# Patient Record
Sex: Female | Born: 1960
Health system: Southern US, Community
[De-identification: ages and names within clinical notes are randomized; demographics above are authoritative.]

## PROBLEM LIST (undated history)

## (undated) DIAGNOSIS — I10 Essential (primary) hypertension: Secondary | ICD-10-CM

## (undated) DIAGNOSIS — D649 Anemia, unspecified: Secondary | ICD-10-CM

## (undated) DIAGNOSIS — M419 Scoliosis, unspecified: Secondary | ICD-10-CM

## (undated) DIAGNOSIS — Z8669 Personal history of other diseases of the nervous system and sense organs: Secondary | ICD-10-CM

## (undated) DIAGNOSIS — M47812 Spondylosis without myelopathy or radiculopathy, cervical region: Secondary | ICD-10-CM

## (undated) DIAGNOSIS — M1990 Unspecified osteoarthritis, unspecified site: Secondary | ICD-10-CM

## (undated) DIAGNOSIS — M533 Sacrococcygeal disorders, not elsewhere classified: Secondary | ICD-10-CM

## (undated) DIAGNOSIS — C44101 Unspecified malignant neoplasm of skin of unspecified eyelid, including canthus: Secondary | ICD-10-CM

## (undated) DIAGNOSIS — E785 Hyperlipidemia, unspecified: Secondary | ICD-10-CM

## (undated) HISTORY — DX: Spondylosis without myelopathy or radiculopathy, cervical region: M47.812

## (undated) HISTORY — DX: Unspecified osteoarthritis, unspecified site: M19.90

## (undated) HISTORY — DX: Personal history of other diseases of the nervous system and sense organs: Z86.69

## (undated) HISTORY — DX: Hyperlipidemia, unspecified: E78.5

## (undated) HISTORY — DX: Scoliosis, unspecified: M41.9

## (undated) HISTORY — DX: Essential (primary) hypertension: I10

## (undated) HISTORY — PX: HAND SURGERY: SHX662

## (undated) HISTORY — DX: Unspecified malignant neoplasm of skin of unspecified eyelid, including canthus: C44.101

## (undated) HISTORY — PX: MOHS SURGERY: SUR867

## (undated) HISTORY — DX: Sacrococcygeal disorders, not elsewhere classified: M53.3

## (undated) HISTORY — DX: Anemia, unspecified: D64.9

---
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
## 1984-07-07 HISTORY — PX: BREAST SURGERY: SHX581

## 2007-10-20 ENCOUNTER — Ambulatory Visit: Payer: Self-pay | Admitting: Pain Medicine

## 2007-10-21 ENCOUNTER — Ambulatory Visit: Payer: Self-pay | Admitting: Pain Medicine

## 2008-07-07 DIAGNOSIS — M533 Sacrococcygeal disorders, not elsewhere classified: Secondary | ICD-10-CM

## 2008-07-07 DIAGNOSIS — M47812 Spondylosis without myelopathy or radiculopathy, cervical region: Secondary | ICD-10-CM

## 2008-07-07 HISTORY — DX: Spondylosis without myelopathy or radiculopathy, cervical region: M47.812

## 2008-07-07 HISTORY — PX: OTHER SURGICAL HISTORY: SHX169

## 2008-07-07 HISTORY — DX: Sacrococcygeal disorders, not elsewhere classified: M53.3

## 2008-07-11 ENCOUNTER — Ambulatory Visit: Payer: Self-pay | Admitting: Pain Medicine

## 2010-06-27 ENCOUNTER — Ambulatory Visit: Payer: Self-pay | Admitting: Pain Medicine

## 2015-12-24 DIAGNOSIS — I10 Essential (primary) hypertension: Secondary | ICD-10-CM | POA: Diagnosis not present

## 2016-04-14 ENCOUNTER — Encounter: Payer: Self-pay | Admitting: Family Medicine

## 2016-04-14 ENCOUNTER — Ambulatory Visit (INDEPENDENT_AMBULATORY_CARE_PROVIDER_SITE_OTHER): Payer: BLUE CROSS/BLUE SHIELD | Admitting: Family Medicine

## 2016-04-14 VITALS — BP 132/91 | HR 63 | Temp 97.9°F | Resp 20 | Ht 60.0 in | Wt 145.2 lb

## 2016-04-14 DIAGNOSIS — E785 Hyperlipidemia, unspecified: Secondary | ICD-10-CM | POA: Insufficient documentation

## 2016-04-14 DIAGNOSIS — Z7689 Persons encountering health services in other specified circumstances: Secondary | ICD-10-CM | POA: Diagnosis not present

## 2016-04-14 DIAGNOSIS — Z23 Encounter for immunization: Secondary | ICD-10-CM | POA: Diagnosis not present

## 2016-04-14 DIAGNOSIS — I10 Essential (primary) hypertension: Secondary | ICD-10-CM | POA: Insufficient documentation

## 2016-04-14 DIAGNOSIS — M1990 Unspecified osteoarthritis, unspecified site: Secondary | ICD-10-CM | POA: Diagnosis not present

## 2016-04-14 DIAGNOSIS — M47812 Spondylosis without myelopathy or radiculopathy, cervical region: Secondary | ICD-10-CM

## 2016-04-14 MED ORDER — MELOXICAM 7.5 MG PO TABS: 7.5000 mg | ORAL_TABLET | Freq: Every day | ORAL | 3 refills | Status: DC

## 2016-04-14 MED ORDER — LOSARTAN POTASSIUM 50 MG PO TABS: 50.0000 mg | ORAL_TABLET | Freq: Every day | ORAL | 1 refills | Status: DC

## 2016-04-14 MED ORDER — SIMVASTATIN 10 MG PO TABS: 10.0000 mg | ORAL_TABLET | Freq: Every day | ORAL | 3 refills | Status: DC

## 2016-04-14 NOTE — Progress Notes (Signed)
Patient ID: Sarah Fisher, female  DOB: 10-20-1960, 55 y.o.   MRN: [id number] Patient Care Team    Relationship Specialty Notifications Start End  Sarah Hillock, DO PCP - General Family Medicine  04/14/16     Subjective:  Sarah Fisher is a 55 y.o.  female present for new patient establishment. All past medical history, surgical history, allergies, family history, immunizations, medications and social history were obtained and entered  in the electronic medical record today. All recent labs, ED visits and hospitalizations within the last year were reviewed.  Hypertension/hyperlipidemia: She is prescribed losartan 50 mg, and has been on this medicine for about 20 hours. She is prescribed zocor 10 mg Qd and tolerates medications. Pt denies chest pain, shortness of breath or LE edema.   Arthritis/neck pain: patient states she has OA and has had a work up for her arthritis by her brother, which is a pain doctor. She is prescribed mobic and  Zanaflex. She states she only uses these if her pain gets bad, otherwise she uses advil when needed. She has records with her today of labs surrounding her condition: RF negative, ESR, CRP, ANA, Uric Acid, bone density and anti-citrulline all normal. Cervical spine xray 2010: reversal lordosis, degenerative C5-6 levels.   Health maintenance:  Colonoscopy: Never, fhx present.  Mammogram: completed:01/02/2015, birads 2 Completed in [place], scanned in system Cervical cancer screening: last pap: 2016, results: normal per pt. [place] 2016 with submucous and intramural myomas 12.2 and 9.2 mm. Immunizations: tdap 12/2014, Influenza administered today (encouraged yearly) Infectious disease screening: HIV and HEP c screening unknown.  DEXA: never Last eye exam: 2013 Last foot exam: 2015  PRIOR REPORT IMPORTED FROM THE [employer] WORKFLOW SYSTEM   REASON FOR EXAM:    neck pain  COMMENTS:   PROCEDURE:     MR  - MR CERVICAL SPINE WO CONT  - Oct 21 2007   8:32AM   RESULT:     Comparison: No available comparison exam.   Procedure: Multiplanar, multisequence MR examination of the cervical spine  was performed without gadolinium contrast.   Findings:   There is straightening the cervical spine without malalignment. The  cervical  cord and cervicomedullary junction are unremarkable. There is no  significant  bone marrow signal abnormality to suggest an acute fracture or a  suspicious  osseous lesion.  C5-C6: There is disc desiccation with mild disc height loss. There is mild  right paracentral disc osteophyte complex. There is mild canal narrowing.  There is no significant neural foraminal narrowing.  There is mild multilevel disc desiccation. No significant disc herniation,  canal or neural foraminal narrowing is seen at the other cervical levels.  The left vertebral artery is dominant.   IMPRESSION:  1. There is mild right paracentral C5-C6 disc osteophyte complex with mild  canal narrowing.   Immunization History  Administered Date(s) Administered  . Influenza,inj,Quad PF,36+ Mos 04/14/2016  . Tdap 12/06/2014    Past Medical History:  Diagnosis Date  . Anemia   . Arthritis   . History of carpal tunnel syndrome   . Hyperlipidemia   . Hypertension   . Scoliosis    No Known Allergies Past Surgical History:  Procedure Laterality Date  . BREAST SURGERY  1986   reduction  . HAND SURGERY Bilateral 2000 and 2016   benign tumor removal    Family History  Problem Relation Age of Onset  . Hypertension Mother   . Arthritis  Mother   . Hypertension Father   . Liver cancer Father   . Hypertension Sister   . Arthritis Maternal Aunt   . Hypertension Maternal Grandmother   . Alzheimer's disease Maternal Grandfather   . Colon cancer Maternal Grandfather   . Diabetes Maternal Grandfather   . Alzheimer's disease Paternal Grandmother    Social History   Social History  . Marital status: Divorced    Spouse name: N/A    . Number of children: 1  . Years of education: N/A   Occupational History  . Not on file.   Social History Main Topics  . Smoking status: Never Smoker  . Smokeless tobacco: Never Used  . Alcohol use No  . Drug use: No  . Sexual activity: Yes    Partners: Male    Birth control/ protection: None   Other Topics Concern  . Not on file   Social History Narrative   Divorced. 3 children, one lives with [name]).   Drinks caffeine, uses herbal remedies. Takes a daily vitamin.    Wears her seatbelt, bicycle helmet.Smoke detector in the home.    Exercises routinely.    Feels safe in her relationships.         Medication List       Accurate as of 04/14/16 12:17 PM. Always use your most recent med list.          ibuprofen 200 MG tablet Commonly known as:  ADVIL,MOTRIN Take 200 mg by mouth every 6 (six) hours as needed.   losartan 50 MG tablet Commonly known as:  COZAAR Take 1 tablet (50 mg total) by mouth daily.   meloxicam 7.5 MG tablet Commonly known as:  MOBIC Take 1 tablet (7.5 mg total) by mouth daily.   simvastatin 10 MG tablet Commonly known as:  ZOCOR Take 1 tablet (10 mg total) by mouth daily.   tiZANidine 2 MG tablet Commonly known as:  ZANAFLEX Take by mouth at bedtime.        No results found for this or any previous visit (from the past 2160 hour(s)).  No results found.   ROS: 14 pt review of systems performed and negative (unless mentioned in an HPI)  Objective: BP (!) 132/91 (BP Location: Right Arm, Patient Position: Sitting, Cuff Size: Normal)   Pulse 63   Temp 97.9 F (36.6 C)   Resp 20   Ht 5' (1.524 m)   Wt 145 lb 4 oz (65.9 kg)   SpO2 99%   BMI 28.37 kg/m  Gen: Afebrile. No acute distress. Nontoxic in appearance, well-developed, well-nourished,  Pleasant  female.  HENT: AT. [place].  MMM Eyes:Pupils Equal Round Reactive to light, Extraocular movements intact,  Conjunctiva without redness, discharge or icterus. Neck/lymp/endocrine:  Supple,no lymphadenopathy CV: RRR no murmur, no edema, +2/4 P posterior tibialis pulses.  Chest: CTAB, no wheeze, rhonchi or crackles.  Abd: Soft. round. NTND. BS present.  Skin:Warm and well-perfused. Skin intact. Neuro/Msk:  Normal gait. PERLA. EOMi. Alert. Oriented x3.   Psych: Normal affect, dress and demeanor. Normal speech. Normal thought content and judgment.  Assessment/plan: Sarah Fisher is a 55 y.o. female present for establishment of care. Need for prophylactic vaccination and inoculation against influenza - Flu Vaccine QUAD 36+ mos PF IM (Fluarix & Fluzone Quad PF)  Essential hypertension/hyperlipidemia - continue zocor and losartan. Refills provided today.  - blood work to be completed on CPE.   Osteoarthritis of cervical spine, unspecified spinal osteoarthritis complication status/Arthritis pain:  -  Take mobic daily with food. Refills provided today. She was not taking medication appropriately to help control arthritis pain.  - if not improved could get new films. Last work up 2010.    Return in about 4 weeks (around 05/12/2016) for CPE./PAP with fasting labs.  Electronically signed by: Howard Pouch, DO [place]

## 2016-04-14 NOTE — Patient Instructions (Signed)
It was a pleasure meeting you today.  Take the daily mobic with food. If this does not help with your arthritis pain in about 2-4 weeks, then we will move forward with other treatments/studies.  Please schedule a CPE with PAP within 4 weeks. Come in fasting (no eating or drinking, anything but water for 9 hours) and we will do all lab work.  I have refilled your medications today.

## 2016-05-15 ENCOUNTER — Ambulatory Visit (INDEPENDENT_AMBULATORY_CARE_PROVIDER_SITE_OTHER): Payer: BLUE CROSS/BLUE SHIELD | Admitting: Family Medicine

## 2016-05-15 ENCOUNTER — Encounter: Payer: Self-pay | Admitting: Family Medicine

## 2016-05-15 ENCOUNTER — Other Ambulatory Visit (HOSPITAL_COMMUNITY)
Admission: RE | Admit: 2016-05-15 | Discharge: 2016-05-15 | Disposition: A | Discharge status: Home / Self Care | Payer: BLUE CROSS/BLUE SHIELD | Source: Ambulatory Visit | Attending: Family Medicine | Admitting: Family Medicine

## 2016-05-15 VITALS — BP 123/88 | HR 70 | Temp 97.9°F | Resp 20 | Ht 60.0 in | Wt 148.0 lb

## 2016-05-15 DIAGNOSIS — Z6828 Body mass index (BMI) 28.0-28.9, adult: Secondary | ICD-10-CM

## 2016-05-15 DIAGNOSIS — Z0000 Encounter for general adult medical examination without abnormal findings: Secondary | ICD-10-CM | POA: Diagnosis not present

## 2016-05-15 DIAGNOSIS — Z1231 Encounter for screening mammogram for malignant neoplasm of breast: Secondary | ICD-10-CM

## 2016-05-15 DIAGNOSIS — Z1321 Encounter for screening for nutritional disorder: Secondary | ICD-10-CM

## 2016-05-15 DIAGNOSIS — Z131 Encounter for screening for diabetes mellitus: Secondary | ICD-10-CM | POA: Diagnosis not present

## 2016-05-15 DIAGNOSIS — M4722 Other spondylosis with radiculopathy, cervical region: Secondary | ICD-10-CM

## 2016-05-15 DIAGNOSIS — Z113 Encounter for screening for infections with a predominantly sexual mode of transmission: Secondary | ICD-10-CM | POA: Diagnosis not present

## 2016-05-15 DIAGNOSIS — Z1159 Encounter for screening for other viral diseases: Secondary | ICD-10-CM

## 2016-05-15 DIAGNOSIS — I10 Essential (primary) hypertension: Secondary | ICD-10-CM | POA: Diagnosis not present

## 2016-05-15 DIAGNOSIS — Z1329 Encounter for screening for other suspected endocrine disorder: Secondary | ICD-10-CM | POA: Diagnosis not present

## 2016-05-15 DIAGNOSIS — Z1211 Encounter for screening for malignant neoplasm of colon: Secondary | ICD-10-CM

## 2016-05-15 DIAGNOSIS — E785 Hyperlipidemia, unspecified: Secondary | ICD-10-CM | POA: Diagnosis not present

## 2016-05-15 DIAGNOSIS — E663 Overweight: Secondary | ICD-10-CM | POA: Insufficient documentation

## 2016-05-15 LAB — COMPREHENSIVE METABOLIC PANEL
ALK PHOS: 60 U/L (ref 39–117)
ALT: 16 U/L (ref 0–35)
AST: 17 U/L (ref 0–37)
Albumin: 4.6 g/dL (ref 3.5–5.2)
BILIRUBIN TOTAL: 0.5 mg/dL (ref 0.2–1.2)
BUN: 21 mg/dL (ref 6–23)
CALCIUM: 9.8 mg/dL (ref 8.4–10.5)
CO2: 28 mEq/L (ref 19–32)
CREATININE: 0.73 mg/dL (ref 0.40–1.20)
Chloride: 106 mEq/L (ref 96–112)
GFR: 87.76 mL/min (ref >60.00)
GLUCOSE: 95 mg/dL (ref 70–99)
Potassium: 3.9 mEq/L (ref 3.5–5.1)
Sodium: 141 mEq/L (ref 135–145)
TOTAL PROTEIN: 7.1 g/dL (ref 6.0–8.3)

## 2016-05-15 LAB — CBC WITH DIFFERENTIAL/PLATELET
BASOS ABS: 0 10*3/uL (ref 0.0–0.1)
Basophils Relative: 0.6 % (ref 0.0–3.0)
Eosinophils Absolute: 0.1 10*3/uL (ref 0.0–0.7)
Eosinophils Relative: 2.2 % (ref 0.0–5.0)
HEMATOCRIT: 37.1 % (ref 36.0–46.0)
Hemoglobin: 12.5 g/dL (ref 12.0–15.0)
LYMPHS PCT: 27.6 % (ref 12.0–46.0)
Lymphs Abs: 1.5 10*3/uL (ref 0.7–4.0)
MCHC: 33.6 g/dL (ref 30.0–36.0)
MCV: 87.9 fl (ref 78.0–100.0)
MONOS PCT: 7.4 % (ref 3.0–12.0)
Monocytes Absolute: 0.4 10*3/uL (ref 0.1–1.0)
Neutro Abs: 3.4 10*3/uL (ref 1.4–7.7)
Neutrophils Relative %: 62.2 % (ref 43.0–77.0)
Platelets: 284 10*3/uL (ref 150.0–400.0)
RBC: 4.22 Mil/uL (ref 3.87–5.11)
RDW: 12.4 % (ref 11.5–15.5)
WBC: 5.5 10*3/uL (ref 4.0–10.5)

## 2016-05-15 LAB — LIPID PANEL
CHOL/HDL RATIO: 3
CHOLESTEROL: 224 mg/dL — AB (ref 0–200)
HDL: 77.9 mg/dL (ref >39.00)
LDL CALC: 115 mg/dL — AB (ref 0–99)
NonHDL: 145.62
Triglycerides: 155 mg/dL — ABNORMAL HIGH (ref 0.0–149.0)
VLDL: 31 mg/dL (ref 0.0–40.0)

## 2016-05-15 LAB — HEMOGLOBIN A1C: HEMOGLOBIN A1C: 5.6 % (ref 4.6–6.5)

## 2016-05-15 LAB — TSH: TSH: 2.06 u[IU]/mL (ref 0.35–4.50)

## 2016-05-15 LAB — VITAMIN D 25 HYDROXY (VIT D DEFICIENCY, FRACTURES): VITD: 21.45 ng/mL — ABNORMAL LOW (ref 30.00–100.00)

## 2016-05-15 NOTE — Patient Instructions (Addendum)
I have referred you for pain management, colonoscopy and mammogram. You will hear from those areas to schedule.  - F/U yearly for physical and every 6 months for hypertension/chronic medical condition.    Health Maintenance, Female Adopting a healthy lifestyle and getting preventive care can go a long way to promote health and wellness. Talk with your health care provider about what schedule of regular examinations is right for you. This is a good chance for you to check in with your provider about disease prevention and staying healthy. In between checkups, there are plenty of things you can do on your own. Experts have done a lot of research about which lifestyle changes and preventive measures are most likely to keep you healthy. Ask your health care provider for more information. WEIGHT AND DIET  Eat a healthy diet  Be sure to include plenty of vegetables, fruits, low-fat dairy products, and lean protein.  Do not eat a lot of foods high in solid fats, added sugars, or salt.  Get regular exercise. This is one of the most important things you can do for your health.  Most adults should exercise for at least 150 minutes each week. The exercise should increase your heart rate and make you sweat (moderate-intensity exercise).  Most adults should also do strengthening exercises at least twice a week. This is in addition to the moderate-intensity exercise.  Maintain a healthy weight  Body mass index (BMI) is a measurement that can be used to identify possible weight problems. It estimates body fat based on height and weight. Your health care provider can help determine your BMI and help you achieve or maintain a healthy weight.  For females 43 years of age and older:   A BMI below 18.5 is considered underweight.  A BMI of 18.5 to 24.9 is normal.  A BMI of 25 to 29.9 is considered overweight.  A BMI of 30 and above is considered obese.  Watch levels of cholesterol and blood  lipids  You should start having your blood tested for lipids and cholesterol at 55 years of age, then have this test every 5 years.  You may need to have your cholesterol levels checked more often if:  Your lipid or cholesterol levels are high.  You are older than 55 years of age.  You are at high risk for heart disease.  CANCER SCREENING   Lung Cancer  Lung cancer screening is recommended for adults 54-65 years old who are at high risk for lung cancer because of a history of smoking.  A yearly low-dose CT scan of the lungs is recommended for people who:  Currently smoke.  Have quit within the past 15 years.  Have at least a 30-pack-year history of smoking. A pack year is smoking an average of one pack of cigarettes a day for 1 year.  Yearly screening should continue until it has been 15 years since you quit.  Yearly screening should stop if you develop a health problem that would prevent you from having lung cancer treatment.  Breast Cancer  Practice breast self-awareness. This means understanding how your breasts normally appear and feel.  It also means doing regular breast self-exams. Let your health care provider know about any changes, no matter how small.  If you are in your 20s or 30s, you should have a clinical breast exam (CBE) by a health care provider every 1-3 years as part of a regular health exam.  If you are 40 or older, have  a CBE every year. Also consider having a breast X-ray (mammogram) every year.  If you have a family history of breast cancer, talk to your health care provider about genetic screening.  If you are at high risk for breast cancer, talk to your health care provider about having an MRI and a mammogram every year.  Breast cancer gene (BRCA) assessment is recommended for women who have family members with BRCA-related cancers. BRCA-related cancers include:  Breast.  Ovarian.  Tubal.  Peritoneal cancers.  Results of the assessment  will determine the need for genetic counseling and BRCA1 and BRCA2 testing. Cervical Cancer Your health care provider may recommend that you be screened regularly for cancer of the pelvic organs (ovaries, uterus, and vagina). This screening involves a pelvic examination, including checking for microscopic changes to the surface of your cervix (Pap test). You may be encouraged to have this screening done every 3 years, beginning at age 63.  For women ages 25-65, health care providers may recommend pelvic exams and Pap testing every 3 years, or they may recommend the Pap and pelvic exam, combined with testing for human papilloma virus (HPV), every 5 years. Some types of HPV increase your risk of cervical cancer. Testing for HPV may also be done on women of any age with unclear Pap test results.  Other health care providers may not recommend any screening for nonpregnant women who are considered low risk for pelvic cancer and who do not have symptoms. Ask your health care provider if a screening pelvic exam is right for you.  If you have had past treatment for cervical cancer or a condition that could lead to cancer, you need Pap tests and screening for cancer for at least 20 years after your treatment. If Pap tests have been discontinued, your risk factors (such as having a new sexual partner) need to be reassessed to determine if screening should resume. Some women have medical problems that increase the chance of getting cervical cancer. In these cases, your health care provider may recommend more frequent screening and Pap tests. Colorectal Cancer  This type of cancer can be detected and often prevented.  Routine colorectal cancer screening usually begins at 55 years of age and continues through 55 years of age.  Your health care provider may recommend screening at an earlier age if you have risk factors for colon cancer.  Your health care provider may also recommend using home test kits to check  for hidden blood in the stool.  A small camera at the end of a tube can be used to examine your colon directly (sigmoidoscopy or colonoscopy). This is done to check for the earliest forms of colorectal cancer.  Routine screening usually begins at age 41.  Direct examination of the colon should be repeated every 5-10 years through 55 years of age. However, you may need to be screened more often if early forms of precancerous polyps or small growths are found. Skin Cancer  Check your skin from head to toe regularly.  Tell your health care provider about any new moles or changes in moles, especially if there is a change in a mole's shape or color.  Also tell your health care provider if you have a mole that is larger than the size of a pencil eraser.  Always use sunscreen. Apply sunscreen liberally and repeatedly throughout the day.  Protect yourself by wearing long sleeves, pants, a wide-brimmed hat, and sunglasses whenever you are outside. HEART DISEASE, DIABETES, AND HIGH  BLOOD PRESSURE   High blood pressure causes heart disease and increases the risk of stroke. High blood pressure is more likely to develop in:  People who have blood pressure in the high end of the normal range (130-139/85-89 mm Hg).  People who are overweight or obese.  People who are African American.  If you are 16-53 years of age, have your blood pressure checked every 3-5 years. If you are 57 years of age or older, have your blood pressure checked every year. You should have your blood pressure measured twice--once when you are at a hospital or clinic, and once when you are not at a hospital or clinic. Record the average of the two measurements. To check your blood pressure when you are not at a hospital or clinic, you can use:  An automated blood pressure machine at a pharmacy.  A home blood pressure monitor.  If you are between 54 years and 31 years old, ask your health care provider if you should take  aspirin to prevent strokes.  Have regular diabetes screenings. This involves taking a blood sample to check your fasting blood sugar level.  If you are at a normal weight and have a low risk for diabetes, have this test once every three years after 55 years of age.  If you are overweight and have a high risk for diabetes, consider being tested at a younger age or more often. PREVENTING INFECTION  Hepatitis B  If you have a higher risk for hepatitis B, you should be screened for this virus. You are considered at high risk for hepatitis B if:  You were born in a country where hepatitis B is common. Ask your health care provider which countries are considered high risk.  Your parents were born in a high-risk country, and you have not been immunized against hepatitis B (hepatitis B vaccine).  You have HIV or AIDS.  You use needles to inject street drugs.  You live with someone who has hepatitis B.  You have had sex with someone who has hepatitis B.  You get hemodialysis treatment.  You take certain medicines for conditions, including cancer, organ transplantation, and autoimmune conditions. Hepatitis C  Blood testing is recommended for:  Everyone born from [birth year] through 1965.  Anyone with known risk factors for hepatitis C. Sexually transmitted infections (STIs)  You should be screened for sexually transmitted infections (STIs) including gonorrhea and chlamydia if:  You are sexually active and are younger than 55 years of age.  You are older than 55 years of age and your health care provider tells you that you are at risk for this type of infection.  Your sexual activity has changed since you were last screened and you are at an increased risk for chlamydia or gonorrhea. Ask your health care provider if you are at risk.  If you do not have HIV, but are at risk, it may be recommended that you take a prescription medicine daily to prevent HIV infection. This is called  pre-exposure prophylaxis (PrEP). You are considered at risk if:  You are sexually active and do not regularly use condoms or know the HIV status of your partner(s).  You take drugs by injection.  You are sexually active with a partner who has HIV. Talk with your health care provider about whether you are at high risk of being infected with HIV. If you choose to begin PrEP, you should first be tested for HIV. You should then be tested every  3 months for as long as you are taking PrEP.  PREGNANCY   If you are premenopausal and you may become pregnant, ask your health care provider about preconception counseling.  If you may become pregnant, take 400 to 800 micrograms (mcg) of folic acid every day.  If you want to prevent pregnancy, talk to your health care provider about birth control (contraception). OSTEOPOROSIS AND MENOPAUSE   Osteoporosis is a disease in which the bones lose minerals and strength with aging. This can result in serious bone fractures. Your risk for osteoporosis can be identified using a bone density scan.  If you are 11 years of age or older, or if you are at risk for osteoporosis and fractures, ask your health care provider if you should be screened.  Ask your health care provider whether you should take a calcium or vitamin D supplement to lower your risk for osteoporosis.  Menopause may have certain physical symptoms and risks.  Hormone replacement therapy may reduce some of these symptoms and risks. Talk to your health care provider about whether hormone replacement therapy is right for you.  HOME CARE INSTRUCTIONS   Schedule regular health, dental, and eye exams.  Stay current with your immunizations.   Do not use any tobacco products including cigarettes, chewing tobacco, or electronic cigarettes.  If you are pregnant, do not drink alcohol.  If you are breastfeeding, limit how much and how often you drink alcohol.  Limit alcohol intake to no more than 1  drink per day for nonpregnant women. One drink equals 12 ounces of beer, 5 ounces of wine, or 1 ounces of hard liquor.  Do not use street drugs.  Do not share needles.  Ask your health care provider for help if you need support or information about quitting drugs.  Tell your health care provider if you often feel depressed.  Tell your health care provider if you have ever been abused or do not feel safe at home.   This information is not intended to replace advice given to you by your health care provider. Make sure you discuss any questions you have with your health care provider.   Document Released: 01/06/2011 Document Revised: 07/14/2014 Document Reviewed: 05/25/2013 Elsevier Interactive Patient Education [employer].

## 2016-05-15 NOTE — Progress Notes (Signed)
Patient ID: Sarah Fisher, female  DOB: 1961-02-23, 55 y.o.   MRN: [id number] Patient Care Team    Relationship Specialty Notifications Start End  Ma Hillock, DO PCP - General Family Medicine  04/14/16     Subjective:  Sarah Fisher is a 55 y.o.  Female  present for CPE. All past medical history, surgical history, allergies, family history, immunizations, medications and social history were updated in the electronic medical record today. All recent labs, ED visits and hospitalizations within the last year were reviewed.  Patient would like to be tested for STDs secondary to recent divorce and the circumstances surrounding that difference she is worried she was exposed to STD. She denies vaginal discharge, irritation or abd pain.  She states she frequently suffers with constipation and she has recently noticed she is more thirsty than prior.   Health maintenance:  Colonoscopy: Never, fhx present in MGF. Referred today to GI.  Mammogram: completed:01/02/2015, birads 2 Completed in [place], scanned in system. Order placed.  Cervical cancer screening: last pap: 2016, results: normal per pt. [place] 2016 with submucous and intramural myomas 12.2 and 9.2 mm. Immunizations: tdap 12/2014, Influenza 2017 (encouraged yearly) Infectious disease screening: HIV and HEP c completed today.  DEXA: never Assistive device: None Oxygen use: None Patient has a Dental home. Hospitalizations/ED visits: none  Depression screen [facility] 2/9 05/15/2016 04/14/2016  Decreased Interest 0 0  Down, Depressed, Hopeless 0 0  PHQ - 2 Score 0 0     Immunization History  Administered Date(s) Administered  . Influenza,inj,Quad PF,36+ Mos 04/14/2016  . Tdap 12/06/2014     Past Medical History:  Diagnosis Date  . Anemia   . Arthritis   . Cervical spine degeneration 2010   C5-6  . History of carpal tunnel syndrome   . Hyperlipidemia   . Hypertension   . Sacroiliac joint pain 2010   right  . Scoliosis     No Known Allergies Past Surgical History:  Procedure Laterality Date  . BREAST SURGERY  1986   reduction  . cervical facet block  2010  . HAND SURGERY Bilateral 2000 and 2016   benign tumor removal    Family History  Problem Relation Age of Onset  . Hypertension Mother   . Arthritis Mother   . Hypertension Father   . Liver cancer Father   . Hypertension Sister   . Arthritis Maternal Aunt   . Hypertension Maternal Grandmother   . Kidney cancer Maternal Grandmother   . Alzheimer's disease Maternal Grandfather   . Colon cancer Maternal Grandfather   . Diabetes Maternal Grandfather   . Alzheimer's disease Paternal Grandmother    Social History   Social History  . Marital status: Divorced    Spouse name: N/A  . Number of children: 3  . Years of education: 35   Occupational History  . housewife    Social History Main Topics  . Smoking status: Never Smoker  . Smokeless tobacco: Never Used  . Alcohol use No  . Drug use: No  . Sexual activity: Yes    Partners: Male    Birth control/ protection: None   Other Topics Concern  . Not on file   Social History Narrative   Divorced. 3 children, one lives with her [name]).   She is from [place], moved in 2016.    Housewife. B.A. Degree.    Drinks caffeine, uses herbal remedies. Takes a daily vitamin.    Wears  her seatbelt, bicycle helmet.Smoke detector in the home.    Exercises routinely.    Feels safe in her relationships.         Medication List       Accurate as of 05/15/16  9:02 AM. Always use your most recent med list.          ibuprofen 200 MG tablet Commonly known as:  ADVIL,MOTRIN Take 200 mg by mouth every 6 (six) hours as needed.   losartan 50 MG tablet Commonly known as:  COZAAR Take 1 tablet (50 mg total) by mouth daily.   meloxicam 7.5 MG tablet Commonly known as:  MOBIC Take 1 tablet (7.5 mg total) by mouth daily.   simvastatin 10 MG tablet Commonly known as:  ZOCOR Take 1 tablet (10  mg total) by mouth daily.   tiZANidine 2 MG tablet Commonly known as:  ZANAFLEX Take by mouth at bedtime.        No results found for this or any previous visit (from the past 2160 hour(s)).  No results found.   ROS: 14 pt review of systems performed and negative (unless mentioned in an HPI)  Objective: BP 123/88 (BP Location: Right Arm, Patient Position: Sitting, Cuff Size: Normal)   Pulse 70   Temp 97.9 F (36.6 C)   Resp 20   Ht 5' (1.524 m)   Wt 148 lb (67.1 kg)   SpO2 98%   BMI 28.90 kg/m  Gen: Afebrile. No acute distress. Nontoxic in appearance, well-developed, well-nourished, [place]  female.  HENT: AT. [place]. Bilateral TM visualized and normal in appearance, normal external auditory canal. MMM, no oral lesions, adequate dentition. Bilateral nares within normal limits. Throat without erythema, ulcerations or exudates. no Cough on exam, no hoarseness on exam. Eyes:Pupils Equal Round Reactive to light, Extraocular movements intact,  Conjunctiva without redness, discharge or icterus. Neck/lymp/endocrine: Supple,no lymphadenopathy, no thyromegaly CV: RRR no murmur, no edema, +2/4 P posterior tibialis pulses. no carotid bruitsno. No JVD. Chest: CTAB, no wheeze, rhonchi or crackles. Normal  Respiratory effort. good Air movement. Abd: Soft. flat. NTND. BS present. no Masses palpated. No hepatosplenomegaly. No rebound tenderness or guarding. Skin: no rashes, purpura or petechiae. Warm and well-perfused. Skin intact. Neuro/Msk:  Normal gait. PERLA. EOMi. Alert. Oriented x3.  Cranial nerves II through XII intact. Muscle strength 5/5 upper/lower extremity. DTRs equal bilaterally. Psych: Normal affect, dress and demeanor. Normal speech. Normal thought content and judgment.  Assessment/plan: Sarah Fisher is a 55 y.o. female present for CPE. Encounter for preventative adult health care examination BMI 28 Patient was encouraged to exercise greater than 150 minutes a week.  Patient was encouraged to choose a diet filled with fresh fruits and vegetables, and lean meats. AVS provided to patient today for education/recommendation on gender specific health and safety maintenance. Colonoscopy: Never, fhx present in MGF. Referred today to GI.  Mammogram: completed:01/02/2015, birads 2 Completed in [place], scanned in system. Order placed. Self breast exams encouraged.  Cervical cancer screening: last pap: 2016, results: normal per pt. [place] 2016 with submucous and intramural myomas 12.2 and 9.2 mm. Immunizations: tdap 12/2014, Influenza 2017 (encouraged yearly) Infectious disease screening: HIV and HEP c completed today.  DEXA: N/A Encounter for screening mammogram for breast cancer - MM DIGITAL SCREENING BILATERAL; Future Colon cancer screening - Ambulatory referral to Gastroenterology Essential hypertension - CBC with Differential - Comp Met (CMET) Hyperlipidemia, unspecified hyperlipidemia type - Lipid panel Diabetes mellitus screening - HgB A1c Thyroid disorder screen -  TSH Screen for STD (sexually transmitted disease) - HIV antibody (with reflex) - RPR - Urine cytology ancillary only Need for hepatitis C screening test - Hepatitis C Antibody Encounter for vitamin deficiency screening - Vitamin D (25 hydroxy) Osteoarthritis of spine with radiculopathy, cervical region - chronic - mobic not helping. She has had treatments/injections through pain clinic in the past and would like to return.  - Ambulatory referral to Pain Clinic- request Dr. Hope Pigeon   Return in about 1 year (around 05/15/2017) for CPE. 6 months for Hypertension  Electronically signed by: Howard Pouch, DO [place]

## 2016-05-16 ENCOUNTER — Telehealth: Payer: Self-pay | Admitting: Family Medicine

## 2016-05-16 DIAGNOSIS — E559 Vitamin D deficiency, unspecified: Secondary | ICD-10-CM | POA: Insufficient documentation

## 2016-05-16 LAB — URINE CYTOLOGY ANCILLARY ONLY
CHLAMYDIA, DNA PROBE: NEGATIVE
Neisseria Gonorrhea: NEGATIVE

## 2016-05-16 LAB — HIV ANTIBODY (ROUTINE TESTING W REFLEX): HIV 1&2 Ab, 4th Generation: NONREACTIVE

## 2016-05-16 LAB — HEPATITIS C ANTIBODY: HCV Ab: NEGATIVE

## 2016-05-16 LAB — RPR

## 2016-05-16 MED ORDER — VITAMIN D (ERGOCALCIFEROL) 1.25 MG (50000 UNIT) PO CAPS: 50000.0000 [IU] | ORAL_CAPSULE | ORAL | 0 refills | Status: DC

## 2016-05-16 NOTE — Telephone Encounter (Signed)
Please call pt: - her labs are all normal, including STD panel, with the following exceptions:  - her vitamin D is low at 21. I have called in a weekly supplement for 12 weeks, and then she should have retesting. She should also start a daily OTC supplement of 800 u daily with a meal.  - her cholesterol is just mildly elevated. Continue zocor 10 mg and if able to tolerate can add fish oil supplement. Increasing exercise to > 150 minutes a week and increasing fiber in her diet would also be helpful.

## 2016-05-16 NOTE — Telephone Encounter (Signed)
Left detailed message with lab results and instructions on patient voice mail. Advised patient to call back if any questions.

## 2016-05-21 ENCOUNTER — Encounter: Payer: Self-pay | Admitting: Gastroenterology

## 2016-05-28 ENCOUNTER — Ambulatory Visit: Payer: BLUE CROSS/BLUE SHIELD

## 2016-07-18 ENCOUNTER — Ambulatory Visit (AMBULATORY_SURGERY_CENTER): Payer: Self-pay | Admitting: *Deleted

## 2016-07-18 VITALS — Ht 60.0 in | Wt 148.8 lb

## 2016-07-18 DIAGNOSIS — Z1211 Encounter for screening for malignant neoplasm of colon: Secondary | ICD-10-CM

## 2016-07-18 MED ORDER — NA SULFATE-K SULFATE-MG SULF 17.5-3.13-1.6 GM/177ML PO SOLN: 1.0000 | Freq: Once | ORAL | 0 refills | Status: AC

## 2016-07-18 NOTE — Progress Notes (Signed)
No allergies to eggs or soy. No problems with anesthesia.  Pt given Emmi instructions for colonoscopy  No oxygen use  No diet drug use  

## 2016-07-23 ENCOUNTER — Encounter: Payer: Self-pay | Admitting: Gastroenterology

## 2016-07-30 ENCOUNTER — Telehealth: Payer: Self-pay | Admitting: Gastroenterology

## 2016-07-30 NOTE — Telephone Encounter (Signed)
Spoke with patient. She has the coupon that was given to her by [facility] nurse so she will go back to the pharmacy and use that. She also states she ate a salad and pop-corn with in the last few days. Encouraged patient not to do this any more and to go by our instructions. No further questions from the patient.

## 2016-08-01 ENCOUNTER — Encounter: Payer: Self-pay | Admitting: Gastroenterology

## 2016-08-01 ENCOUNTER — Ambulatory Visit (AMBULATORY_SURGERY_CENTER): Payer: BLUE CROSS/BLUE SHIELD | Admitting: Gastroenterology

## 2016-08-01 VITALS — BP 122/71 | HR 60 | Temp 97.7°F | Resp 16 | Wt 148.0 lb

## 2016-08-01 DIAGNOSIS — Z1211 Encounter for screening for malignant neoplasm of colon: Secondary | ICD-10-CM | POA: Diagnosis not present

## 2016-08-01 DIAGNOSIS — Z1212 Encounter for screening for malignant neoplasm of rectum: Secondary | ICD-10-CM | POA: Diagnosis not present

## 2016-08-01 DIAGNOSIS — Z800 Family history of malignant neoplasm of digestive organs: Secondary | ICD-10-CM

## 2016-08-01 DIAGNOSIS — D120 Benign neoplasm of cecum: Secondary | ICD-10-CM

## 2016-08-01 MED ORDER — SODIUM CHLORIDE 0.9 % IV SOLN
500.0000 mL | INTRAVENOUS | Status: DC
Start: 2016-08-01 — End: 2016-09-03

## 2016-08-01 NOTE — Progress Notes (Signed)
A/ox3 pleased with MAC, report to [employer]

## 2016-08-01 NOTE — Op Note (Signed)
[place] Patient Name: Sarah Fisher Procedure Date: 08/01/2016 10:35 AM MRN: [id number] Endoscopist: Mauri Pole , MD Age: 56 Referring MD:  Date of Birth: 1960/12/05 Gender: Female Account #: [id number] Procedure:                Colonoscopy Indications:              Colon cancer screening in patient at increased                            risk: Family history of colorectal cancer in                            multiple 2nd degree relatives, This is the                            patient's first colonoscopy Medicines:                Monitored Anesthesia Care Procedure:                Pre-Anesthesia Assessment:                           - Prior to the procedure, a History and Physical                            was performed, and patient medications and                            allergies were reviewed. The patient's tolerance of                            previous anesthesia was also reviewed. The risks                            and benefits of the procedure and the sedation                            options and risks were discussed with the patient.                            All questions were answered, and informed consent                            was obtained. Prior Anticoagulants: The patient has                            taken no previous anticoagulant or antiplatelet                            agents. ASA Grade Assessment: II - A patient with                            mild systemic disease. After reviewing the risks  and benefits, the patient was deemed in                            satisfactory condition to undergo the procedure.                           After obtaining informed consent, the colonoscope                            was passed under direct vision. Throughout the                            procedure, the patient's blood pressure, pulse, and                            oxygen saturations were monitored  continuously. The                            Model CF-HQ190L [id number]) scope was introduced                            through the anus and advanced to the the terminal                            ileum, with identification of the appendiceal                            orifice and IC valve. The colonoscopy was performed                            without difficulty. The patient tolerated the                            procedure well. The quality of the bowel                            preparation was excellent. The terminal ileum,                            ileocecal valve, appendiceal orifice, and rectum                            were photographed. Scope In: 10:44:52 AM Scope Out: 10:58:44 AM Scope Withdrawal Time: 0 hours 8 minutes 44 seconds  Total Procedure Duration: 0 hours 13 minutes 52 seconds  Findings:                 The perianal and digital rectal examinations were                            normal.                           A 2 mm polyp was found in the cecum. The polyp was  sessile. The polyp was removed with a cold biopsy                            forceps. Resection and retrieval were complete.                           Non-bleeding internal hemorrhoids were found during                            retroflexion. The hemorrhoids were small.                           The exam was otherwise without abnormality. Complications:            No immediate complications. Estimated Blood Loss:     Estimated blood loss was minimal. Impression:               - One 2 mm polyp in the cecum, removed with a cold                            biopsy forceps. Resected and retrieved.                           - Non-bleeding internal hemorrhoids.                           - The examination was otherwise normal. Recommendation:           - Patient has a contact number available for                            emergencies. The signs and symptoms of potential                             delayed complications were discussed with the                            patient. Return to normal activities tomorrow.                            Written discharge instructions were provided to the                            patient.                           - Resume previous diet.                           - Continue present medications.                           - Await pathology results.                           - Repeat colonoscopy in 5 years for surveillance  based on pathology results.                           - Return to GI clinic PRN. Mauri Pole, MD 08/01/2016 11:05:33 AM This report has been signed electronically.

## 2016-08-01 NOTE — Progress Notes (Signed)
Called to room to assist during endoscopic procedure.  Patient ID and intended procedure confirmed with present staff. Received instructions for my participation in the procedure from the performing physician.  

## 2016-08-01 NOTE — Patient Instructions (Signed)
Impression/Recommendations:  Polyp handout given to patient. Hemorrhoid handout given to patient.  Repeat colonoscopy in 5 years for surveillance.  YOU HAD AN ENDOSCOPIC PROCEDURE TODAY AT THE [facility]:   Refer to the procedure report that was given to you for any specific questions about what was found during the examination.  If the procedure report does not answer your questions, please call your gastroenterologist to clarify.  If you requested that your care partner not be given the details of your procedure findings, then the procedure report has been included in a sealed envelope for you to review at your convenience later.  YOU SHOULD EXPECT: Some feelings of bloating in the abdomen. Passage of more gas than usual.  Walking can help get rid of the air that was put into your GI tract during the procedure and reduce the bloating. If you had a lower endoscopy (such as a colonoscopy or flexible sigmoidoscopy) you may notice spotting of blood in your stool or on the toilet paper. If you underwent a bowel prep for your procedure, you may not have a normal bowel movement for a few days.  Please Note:  You might notice some irritation and congestion in your nose or some drainage.  This is from the oxygen used during your procedure.  There is no need for concern and it should clear up in a day or so.  SYMPTOMS TO REPORT IMMEDIATELY:   Following lower endoscopy (colonoscopy or flexible sigmoidoscopy):  Excessive amounts of blood in the stool  Significant tenderness or worsening of abdominal pains  Swelling of the abdomen that is new, acute  Fever of 100F or higher For urgent or emergent issues, a gastroenterologist can be reached at any hour by calling [phone].   DIET:  We do recommend a small meal at first, but then you may proceed to your regular diet.  Drink plenty of fluids but you should avoid alcoholic beverages for 24 hours.  ACTIVITY:  You should plan to take it  easy for the rest of today and you should NOT DRIVE or use heavy machinery until tomorrow (because of the sedation medicines used during the test).    FOLLOW UP: Our staff will call the number listed on your records the next business day following your procedure to check on you and address any questions or concerns that you may have regarding the information given to you following your procedure. If we do not reach you, we will leave a message.  However, if you are feeling well and you are not experiencing any problems, there is no need to return our call.  We will assume that you have returned to your regular daily activities without incident.  If any biopsies were taken you will be contacted by phone or by letter within the next 1-3 weeks.  Please call us at [phone] if you have not heard about the biopsies in 3 weeks.    SIGNATURES/CONFIDENTIALITY: You and/or your care partner have signed paperwork which will be entered into your electronic medical record.  These signatures attest to the fact that that the information above on your After Visit Summary has been reviewed and is understood.  Full responsibility of the confidentiality of this discharge information lies with you and/or your care-partner. 

## 2016-08-04 ENCOUNTER — Telehealth: Payer: Self-pay

## 2016-08-04 NOTE — Telephone Encounter (Signed)
  Follow up Call-  Call back number 08/01/2016  Post procedure Call Back phone  # [phone]  Permission to leave phone message Yes  Some recent data might be hidden     Patient questions:  Do you have a fever, pain , or abdominal swelling? No. Pain Score  0 *  Have you tolerated food without any problems? Yes.    Have you been able to return to your normal activities? Yes.    Do you have any questions about your discharge instructions: Diet   No. Medications  No. Follow up visit  No.  Do you have questions or concerns about your Care? No.  Actions: * If pain score is 4 or above: No action needed, pain <4.

## 2016-08-11 ENCOUNTER — Encounter: Payer: Self-pay | Admitting: Gastroenterology

## 2016-08-25 ENCOUNTER — Ambulatory Visit: Payer: BLUE CROSS/BLUE SHIELD | Admitting: Pain Medicine

## 2016-09-03 ENCOUNTER — Telehealth: Payer: Self-pay | Admitting: Family Medicine

## 2016-09-03 ENCOUNTER — Encounter: Payer: Self-pay | Admitting: Family Medicine

## 2016-09-03 ENCOUNTER — Ambulatory Visit (INDEPENDENT_AMBULATORY_CARE_PROVIDER_SITE_OTHER): Payer: BLUE CROSS/BLUE SHIELD | Admitting: Family Medicine

## 2016-09-03 VITALS — BP 142/97 | HR 72 | Temp 98.0°F | Resp 20 | Ht 60.0 in | Wt 151.2 lb

## 2016-09-03 DIAGNOSIS — D126 Benign neoplasm of colon, unspecified: Secondary | ICD-10-CM

## 2016-09-03 DIAGNOSIS — E559 Vitamin D deficiency, unspecified: Secondary | ICD-10-CM

## 2016-09-03 DIAGNOSIS — R1013 Epigastric pain: Secondary | ICD-10-CM | POA: Diagnosis not present

## 2016-09-03 DIAGNOSIS — H8112 Benign paroxysmal vertigo, left ear: Secondary | ICD-10-CM | POA: Diagnosis not present

## 2016-09-03 LAB — VITAMIN D 25 HYDROXY (VIT D DEFICIENCY, FRACTURES): VITD: 26.44 ng/mL — AB (ref 30.00–100.00)

## 2016-09-03 NOTE — Telephone Encounter (Signed)
Please call pt: - Vit d is mildly improved but not quite in normal ranges. I would advise her to take 2000 u daily OTC with a meal of vitamin D.

## 2016-09-03 NOTE — Patient Instructions (Addendum)
Try Align probiotic (it is over the counter) use for at least 8-12 weeks daily.   Benign Positional Vertigo Vertigo is the feeling that you or your surroundings are moving when they are not. Benign positional vertigo is the most common form of vertigo. The cause of this condition is not serious (is benign). This condition is triggered by certain movements and positions (is positional). This condition can be dangerous if it occurs while you are doing something that could endanger you or others, such as driving. What are the causes? In many cases, the cause of this condition is not known. It may be caused by a disturbance in an area of the inner ear that helps your brain to sense movement and balance. This disturbance can be caused by a viral infection (labyrinthitis), head injury, or repetitive motion. What increases the risk? This condition is more likely to develop in:  Women.  People who are 21 years of age or older. What are the signs or symptoms? Symptoms of this condition usually happen when you move your head or your eyes in different directions. Symptoms may start suddenly, and they usually last for less than a minute. Symptoms may include:  Loss of balance and falling.  Feeling like you are spinning or moving.  Feeling like your surroundings are spinning or moving.  Nausea and vomiting.  Blurred vision.  Dizziness.  Involuntary eye movement (nystagmus). Symptoms can be mild and cause only slight annoyance, or they can be severe and interfere with daily life. Episodes of benign positional vertigo may return (recur) over time, and they may be triggered by certain movements. Symptoms may improve over time. How is this diagnosed? This condition is usually diagnosed by medical history and a physical exam of the head, neck, and ears. You may be referred to a health care provider who specializes in ear, nose, and throat (ENT) problems (otolaryngologist) or a provider who specializes in  disorders of the nervous system (neurologist). You may have additional testing, including:  MRI.  A CT scan.  Eye movement tests. Your health care provider may ask you to change positions quickly while he or she watches you for symptoms of benign positional vertigo, such as nystagmus. Eye movement may be tested with an electronystagmogram (ENG), caloric stimulation, the Dix-Hallpike test, or the roll test.  An electroencephalogram (EEG). This records electrical activity in your brain.  Hearing tests. How is this treated? Usually, your health care provider will treat this by moving your head in specific positions to adjust your inner ear back to normal. Surgery may be needed in severe cases, but this is rare. In some cases, benign positional vertigo may resolve on its own in 2-4 weeks. Follow these instructions at home: Safety   Move slowly.Avoid sudden body or head movements.  Avoid driving.  Avoid operating heavy machinery.  Avoid doing any tasks that would be dangerous to you or others if a vertigo episode would occur.  If you have trouble walking or keeping your balance, try using a cane for stability. If you feel dizzy or unstable, sit down right away.  Return to your normal activities as told by your health care provider. Ask your health care provider what activities are safe for you. General instructions   Take over-the-counter and prescription medicines only as told by your health care provider.  Avoid certain positions or movements as told by your health care provider.  Drink enough fluid to keep your urine clear or pale yellow.  Keep all  follow-up visits as told by your health care provider. This is important. Contact a health care provider if:  You have a fever.  Your condition gets worse or you develop new symptoms.  Your family or friends notice any behavioral changes.  Your nausea or vomiting gets worse.  You have numbness or a "pins and needles"  sensation. Get help right away if:  You have difficulty speaking or moving.  You are always dizzy.  You faint.  You develop severe headaches.  You have weakness in your legs or arms.  You have changes in your hearing or vision.  You develop a stiff neck.  You develop sensitivity to light. This information is not intended to replace advice given to you by your health care provider. Make sure you discuss any questions you have with your health care provider. Document Released: 03/31/2006 Document Revised: 11/29/2015 Document Reviewed: 10/16/2014 Elsevier Interactive Patient Education  2017 [employer]   Hiatal Hernia A hiatal hernia occurs when part of the stomach slides above the muscle that separates the abdomen from the chest (diaphragm). A person can be born with a hiatal hernia (congenital), or it may develop over time. In almost all cases of hiatal hernia, only the top part of the stomach pushes through the diaphragm. Many people have a hiatal hernia with no symptoms. The larger the hernia, the more likely it is that you will have symptoms. In some cases, a hiatal hernia allows stomach acid to flow back into the tube that carries food from your mouth to your stomach (esophagus). This may cause heartburn symptoms. Severe heartburn symptoms may mean that you have developed a condition called gastroesophageal reflux disease (GERD). What are the causes? This condition is caused by a weakness in the opening (hiatus) where the esophagus passes through the diaphragm to attach to the upper part of the stomach. A person may be born with a weakness in the hiatus, or a weakness can develop over time. What increases the risk? This condition is more likely to develop in:  Older people. Age is a major risk factor for a hiatal hernia, especially if you are over the age of 85.  Pregnant women.  People who are overweight.  People who have frequent constipation. What are the signs or  symptoms? Symptoms of this condition usually develop in the form of GERD symptoms. Symptoms include:  Heartburn.  Belching.  Indigestion.  Trouble swallowing.  Coughing or wheezing.  Sore throat.  Hoarseness.  Chest pain.  Nausea and vomiting. How is this diagnosed? This condition may be diagnosed during testing for GERD. Tests that may be done include:  X-rays of your stomach or chest.  An upper gastrointestinal (GI) series. This is an X-ray exam of your GI tract that is taken after you swallow a chalky liquid that shows up clearly on the X-ray.  Endoscopy. This is a procedure to look into your stomach using a thin, flexible tube that has a tiny camera and light on the end of it. How is this treated? This condition may be treated by:  Dietary and lifestyle changes to help reduce GERD symptoms.  Medicines. These may include:  Over-the-counter antacids.  Medicines that make your stomach empty more quickly.  Medicines that block the production of stomach acid (H2 blockers).  Stronger medicines to reduce stomach acid (proton pump inhibitors).  Surgery to repair the hernia, if other treatments are not helping. If you have no symptoms, you may not need treatment. Follow these instructions  at home: Lifestyle and activity   Do not use any products that contain nicotine or tobacco, such as cigarettes and e-cigarettes. If you need help quitting, ask your health care provider.  Try to achieve and maintain a healthy body weight.  Avoid putting pressure on your abdomen. Anything that puts pressure on your abdomen increases the amount of acid that may be pushed up into your esophagus.  Avoid bending over, especially after eating.  Raise the head of your bed by putting blocks under the legs. This keeps your head and esophagus higher than your stomach.  Do not wear tight clothing around your chest or stomach.  Try not to strain when having a bowel movement, when  urinating, or when lifting heavy objects. Eating and drinking   Avoid foods that can worsen GERD symptoms. These may include:  Fatty foods, like fried foods.  Citrus fruits, like oranges or lemon.  Other foods and drinks that contain acid, like orange juice or tomatoes.  Spicy food.  Chocolate.  Eat frequent small meals instead of three large meals a day. This helps prevent your stomach from getting too full.  Eat slowly.  Do not lie down right after eating.  Do not eat 1-2 hours before bed.  Do not drink beverages with caffeine. These include cola, coffee, cocoa, and tea.  Do not drink alcohol. General instructions   Take over-the-counter and prescription medicines only as told by your health care provider.  Keep all follow-up visits as told by your health care provider. This is important. Contact a health care provider if:  Your symptoms are not controlled with medicines or lifestyle changes.  You are having trouble swallowing.  You have coughing or wheezing that will not go away. Get help right away if:  Your pain is getting worse.  Your pain spreads to your arms, neck, jaw, teeth, or back.  You have shortness of breath.  You sweat for no reason.  You feel sick to your stomach (nauseous) or you vomit.  You vomit blood.  You have bright red blood in your stools.  You have black, tarry stools. This information is not intended to replace advice given to you by your health care provider. Make sure you discuss any questions you have with your health care provider. Document Released: 09/13/2003 Document Revised: 06/16/2016 Document Reviewed: 06/16/2016 Elsevier Interactive Patient Education  2017 [employer].

## 2016-09-03 NOTE — Progress Notes (Signed)
Patient ID: Sarah Fisher, female  DOB: [birth year], 56 y.o.   MRN: [id number] Patient Care Team    Relationship Specialty Notifications Start End  Ma Hillock, DO PCP - General Family Medicine  04/14/16     Subjective:  Sarah Fisher is a 56 y.o.  Female  present for Vit D follow up.  Vitamin D deficiency: she finished the supplement prescribed and is now taking the OTC vit d 800u. She  finsished the prescribed dosing about 3 weeks ago. She states she might have felt a little more energy with supplement.   Colonoscopy: Pt would like to discuss her colonoscopy completed on 08/01/2016 with Dr. Silverio Decamp. She states she was told to have a repeat colonoscopy in 5 years secondary to the removal of a "pre-cancerous" polyp. She  Has a  fhx present in MGF. The pathology report reviewed today states a tubular adenoma and the letter from her GI specialist states a 5 year follow up.   Epigastric pain: Patient states she has had a tight spasm" like pain lasting a few seconds only, occurring intermittently over the last 6 months. The pain is located in her upper abdomen just below her sternum. She feels that the pain may be worse when she is constipated. She denies any symptoms with eating. She states the pain is very brief and goes away within seconds. She denies any nausea, vomiting, heartburn or unintentional weight loss.  Vertigo: Patient states that she's had vertigo over the past 5 months and frequently. She states it's mostly when she moves her head from the left position. She reports this only last a few seconds in time and occurs typically first thing in the morning if it will occur. It happens very infrequently.  *Of note patient wanted to discuss additional issues, I encouraged her to make an appointment to discuss her arthritis, her appointment was scheduled for vitamin D follow-up today. Patient has been seen for this in the past, referred to her brother who is a physician, and has  received injections in the past with him. I advised her to follow back up with him.   Depression screen [facility] 2/9 05/15/2016 04/14/2016  Decreased Interest 0 0  Down, Depressed, Hopeless 0 0  PHQ - 2 Score 0 0     Immunization History  Administered Date(s) Administered  . Influenza,inj,Quad PF,36+ Mos 04/14/2016  . Tdap 12/06/2014     Past Medical History:  Diagnosis Date  . Anemia   . Arthritis   . Cervical spine degeneration 2010   C5-6  . History of carpal tunnel syndrome   . Hyperlipidemia   . Hypertension   . Sacroiliac joint pain 2010   right  . Scoliosis    No Known Allergies Past Surgical History:  Procedure Laterality Date  . BREAST SURGERY  1986   reduction  . cervical facet block  2010  . HAND SURGERY Bilateral 2000 and 2016   benign tumor removal   . WISDOM TOOTH EXTRACTION  1979   Family History  Problem Relation Age of Onset  . Hypertension Mother   . Arthritis Mother   . Hypertension Father   . Liver cancer Father   . Hypertension Sister   . Arthritis Maternal Aunt   . Hypertension Maternal Grandmother   . Kidney cancer Maternal Grandmother   . Alzheimer's disease Maternal Grandfather   . Colon cancer Maternal Grandfather 11  . Diabetes Maternal Grandfather   . Alzheimer's disease Paternal Grandmother   .  Esophageal cancer Neg Hx   . Rectal cancer Neg Hx   . Stomach cancer Neg Hx    Social History   Social History  . Marital status: Divorced    Spouse name: N/A  . Number of children: 3  . Years of education: 34   Occupational History  . housewife    Social History Main Topics  . Smoking status: Never Smoker  . Smokeless tobacco: Never Used  . Alcohol use No     Comment: a glass of wine occasionally  . Drug use: No  . Sexual activity: Yes    Partners: Male    Birth control/ protection: None   Other Topics Concern  . Not on file   Social History Narrative   Divorced. 3 children, one lives with her [name]).   She is from  [place], moved in 2016.    Housewife. B.A. Degree.    Drinks caffeine, uses herbal remedies. Takes a daily vitamin.    Wears her seatbelt, bicycle helmet.Smoke detector in the home.    Exercises routinely.    Feels safe in her relationships.       Allergies as of 09/03/2016   No Known Allergies     Medication List       Accurate as of 09/03/16  9:52 AM. Always use your most recent med list.          APPLE CIDER VINEGAR PO Take by mouth daily.   GARCINIA CAMBOGIA-CHROMIUM PO Take by mouth daily.   Green Tea 315 MG Caps Take by mouth daily.   ibuprofen 200 MG tablet Commonly known as:  ADVIL,MOTRIN Take 200 mg by mouth every 6 (six) hours as needed.   losartan 50 MG tablet Commonly known as:  COZAAR Take 1 tablet (50 mg total) by mouth daily.   meloxicam 7.5 MG tablet Commonly known as:  MOBIC Take 1 tablet (7.5 mg total) by mouth daily.   multivitamin tablet Take 1 tablet by mouth daily. With vit D   PEPCID COMPLETE PO Take by mouth as needed.   simvastatin 10 MG tablet Commonly known as:  ZOCOR Take 1 tablet (10 mg total) by mouth daily.   tiZANidine 2 MG tablet Commonly known as:  ZANAFLEX Take by mouth at bedtime.        No results found for this or any previous visit (from the past 2160 hour(s)).  No results found.   ROS: 14 pt review of systems performed and negative (unless mentioned in an HPI)  Objective: BP (!) 142/97 (BP Location: Right Arm, Patient Position: Sitting, Cuff Size: Normal)   Pulse 72   Temp 98 F (36.7 C)   Resp 20   Ht 5' (1.524 m)   Wt 151 lb 4 oz (68.6 kg)   SpO2 99%   BMI 29.54 kg/m   Gen: Afebrile. No acute distress.  HENT: AT. [place].  MMM.  Eyes:Pupils Equal Round Reactive to light, Extraocular movements intact,  Conjunctiva without redness, discharge or icterus. CV: RRR, no edema, +2/4 P posterior tibialis pulses Chest: CTAB, no wheeze or crackles Abd: Soft. Flat. NTND. BS present. No Masses palpated.    Neuro: Normal gait. PERLA. EOMi. Alert. Oriented.   Assessment/plan: Sarah Fisher is a 56 y.o. female present for vit d deficiency follow up.  Encounter for vitamin D deficiency: - Vitamin D (25 hydroxy) - Patient will continue her daily supplement over-the-counter, if vitamin D is not adequate after recheck today, will advise her on  increasing her dose.  Benign paroxysmal positional vertigo of left ear - New - Briefly discussed BPPV, symptoms seem pretty classic. Educated patient on potential causes. AVS provided on further education. Patient encouraged to follow-up only if this is worsening.  Epigastric abdominal pain - New Exam is benign today. Patient's history is consistent with a small hiatal hernia. Discussed bowel health with probiotic. Increase water consumption to avoid constipation. AVS education on hiatal hernia. If worsening would encourage her to make an appointment with her gastroenterologist to address.  Tubular adenoma colon polyp: - Reviewed report with patient today. Discussed 5 year follow-up per tubular adenoma is what her gastroenterologist is recommending. Discussed normal recommendation for patients with precancerous findings can be 3-5 years depending upon findings and gastroenterologist's recommendations. I encouraged her to direct any further questions to her gastroenterologist since she is the one performed the colonoscopy and specializes in this area.  Follow-up when necessary on above issues.   Electronically signed by: Howard Pouch, DO [place]

## 2016-09-04 NOTE — Telephone Encounter (Signed)
Patient notified and verbalized understanding. 

## 2016-10-01 ENCOUNTER — Ambulatory Visit
Admission: RE | Admit: 2016-10-01 | Discharge: 2016-10-01 | Disposition: A | Discharge status: Home / Self Care | Payer: BLUE CROSS/BLUE SHIELD | Source: Ambulatory Visit | Attending: Pain Medicine | Admitting: Pain Medicine

## 2016-10-01 ENCOUNTER — Encounter: Payer: Self-pay | Admitting: Pain Medicine

## 2016-10-01 ENCOUNTER — Other Ambulatory Visit
Admission: RE | Admit: 2016-10-01 | Discharge: 2016-10-01 | Disposition: A | Discharge status: Home / Self Care | Payer: BLUE CROSS/BLUE SHIELD | Source: Ambulatory Visit | Attending: Pain Medicine | Admitting: Pain Medicine

## 2016-10-01 ENCOUNTER — Ambulatory Visit: Payer: BLUE CROSS/BLUE SHIELD | Attending: Pain Medicine | Admitting: Pain Medicine

## 2016-10-01 VITALS — BP 122/76 | HR 67 | Temp 97.9°F | Resp 16 | Ht 60.0 in | Wt 145.0 lb

## 2016-10-01 DIAGNOSIS — M25561 Pain in right knee: Secondary | ICD-10-CM

## 2016-10-01 DIAGNOSIS — M47812 Spondylosis without myelopathy or radiculopathy, cervical region: Secondary | ICD-10-CM | POA: Insufficient documentation

## 2016-10-01 DIAGNOSIS — M5030 Other cervical disc degeneration, unspecified cervical region: Secondary | ICD-10-CM

## 2016-10-01 DIAGNOSIS — R51 Headache: Secondary | ICD-10-CM

## 2016-10-01 DIAGNOSIS — Z85828 Personal history of other malignant neoplasm of skin: Secondary | ICD-10-CM | POA: Diagnosis not present

## 2016-10-01 DIAGNOSIS — M170 Bilateral primary osteoarthritis of knee: Secondary | ICD-10-CM | POA: Diagnosis not present

## 2016-10-01 DIAGNOSIS — R200 Anesthesia of skin: Secondary | ICD-10-CM | POA: Diagnosis not present

## 2016-10-01 DIAGNOSIS — M25511 Pain in right shoulder: Secondary | ICD-10-CM | POA: Insufficient documentation

## 2016-10-01 DIAGNOSIS — M4802 Spinal stenosis, cervical region: Secondary | ICD-10-CM | POA: Diagnosis not present

## 2016-10-01 DIAGNOSIS — M545 Low back pain, unspecified: Secondary | ICD-10-CM

## 2016-10-01 DIAGNOSIS — M06019 Rheumatoid arthritis without rheumatoid factor, unspecified shoulder: Secondary | ICD-10-CM

## 2016-10-01 DIAGNOSIS — G8929 Other chronic pain: Secondary | ICD-10-CM | POA: Insufficient documentation

## 2016-10-01 DIAGNOSIS — M47892 Other spondylosis, cervical region: Secondary | ICD-10-CM | POA: Insufficient documentation

## 2016-10-01 DIAGNOSIS — G894 Chronic pain syndrome: Secondary | ICD-10-CM

## 2016-10-01 DIAGNOSIS — M791 Myalgia: Secondary | ICD-10-CM | POA: Insufficient documentation

## 2016-10-01 DIAGNOSIS — M50321 Other cervical disc degeneration at C4-C5 level: Secondary | ICD-10-CM | POA: Diagnosis not present

## 2016-10-01 DIAGNOSIS — Z862 Personal history of diseases of the blood and blood-forming organs and certain disorders involving the immune mechanism: Secondary | ICD-10-CM

## 2016-10-01 DIAGNOSIS — M4682 Other specified inflammatory spondylopathies, cervical region: Secondary | ICD-10-CM | POA: Insufficient documentation

## 2016-10-01 DIAGNOSIS — M25562 Pain in left knee: Secondary | ICD-10-CM

## 2016-10-01 DIAGNOSIS — K5909 Other constipation: Secondary | ICD-10-CM | POA: Diagnosis not present

## 2016-10-01 DIAGNOSIS — D649 Anemia, unspecified: Secondary | ICD-10-CM | POA: Insufficient documentation

## 2016-10-01 DIAGNOSIS — M5481 Occipital neuralgia: Secondary | ICD-10-CM | POA: Diagnosis not present

## 2016-10-01 DIAGNOSIS — M50322 Other cervical disc degeneration at C5-C6 level: Secondary | ICD-10-CM | POA: Diagnosis not present

## 2016-10-01 DIAGNOSIS — M25512 Pain in left shoulder: Secondary | ICD-10-CM

## 2016-10-01 DIAGNOSIS — M25811 Other specified joint disorders, right shoulder: Secondary | ICD-10-CM | POA: Diagnosis not present

## 2016-10-01 DIAGNOSIS — M546 Pain in thoracic spine: Secondary | ICD-10-CM

## 2016-10-01 DIAGNOSIS — M4722 Other spondylosis with radiculopathy, cervical region: Secondary | ICD-10-CM | POA: Insufficient documentation

## 2016-10-01 DIAGNOSIS — M542 Cervicalgia: Secondary | ICD-10-CM

## 2016-10-01 DIAGNOSIS — M792 Neuralgia and neuritis, unspecified: Secondary | ICD-10-CM | POA: Insufficient documentation

## 2016-10-01 DIAGNOSIS — R519 Headache, unspecified: Secondary | ICD-10-CM

## 2016-10-01 DIAGNOSIS — M5412 Radiculopathy, cervical region: Secondary | ICD-10-CM | POA: Insufficient documentation

## 2016-10-01 DIAGNOSIS — K5904 Chronic idiopathic constipation: Secondary | ICD-10-CM | POA: Insufficient documentation

## 2016-10-01 DIAGNOSIS — M4692 Unspecified inflammatory spondylopathy, cervical region: Secondary | ICD-10-CM

## 2016-10-01 DIAGNOSIS — G4486 Cervicogenic headache: Secondary | ICD-10-CM | POA: Insufficient documentation

## 2016-10-01 DIAGNOSIS — M069 Rheumatoid arthritis, unspecified: Secondary | ICD-10-CM | POA: Diagnosis not present

## 2016-10-01 DIAGNOSIS — M7918 Myalgia, other site: Secondary | ICD-10-CM | POA: Insufficient documentation

## 2016-10-01 LAB — COMPREHENSIVE METABOLIC PANEL
ALT: 18 U/L (ref 14–54)
ANION GAP: 8 (ref 5–15)
AST: 23 U/L (ref 15–41)
Albumin: 4.4 g/dL (ref 3.5–5.0)
Alkaline Phosphatase: 57 U/L (ref 38–126)
BILIRUBIN TOTAL: 0.8 mg/dL (ref 0.3–1.2)
BUN: 17 mg/dL (ref 6–20)
CHLORIDE: 105 mmol/L (ref 101–111)
CO2: 26 mmol/L (ref 22–32)
Calcium: 9.4 mg/dL (ref 8.9–10.3)
Creatinine, Ser: 0.61 mg/dL (ref 0.44–1.00)
Glucose, Bld: 97 mg/dL (ref 65–99)
POTASSIUM: 3.9 mmol/L (ref 3.5–5.1)
Sodium: 139 mmol/L (ref 135–145)
TOTAL PROTEIN: 7.4 g/dL (ref 6.5–8.1)

## 2016-10-01 LAB — VITAMIN B12: VITAMIN B 12: 674 pg/mL (ref 180–914)

## 2016-10-01 LAB — C-REACTIVE PROTEIN

## 2016-10-01 LAB — MAGNESIUM: MAGNESIUM: 2.2 mg/dL (ref 1.7–2.4)

## 2016-10-01 LAB — SEDIMENTATION RATE: Sed Rate: 21 mm/hr (ref 0–30)

## 2016-10-01 MED ORDER — TRAMADOL HCL 50 MG PO TABS: 50.0000 mg | ORAL_TABLET | Freq: Four times a day (QID) | ORAL | 0 refills | Status: DC | PRN

## 2016-10-01 MED ORDER — GABAPENTIN 100 MG PO CAPS: 100.0000 mg | ORAL_CAPSULE | Freq: Three times a day (TID) | ORAL | 0 refills | Status: DC

## 2016-10-01 MED ORDER — TIZANIDINE HCL 2 MG PO CAPS: 2.0000 mg | ORAL_CAPSULE | Freq: Three times a day (TID) | ORAL | 0 refills | Status: DC | PRN

## 2016-10-01 NOTE — Progress Notes (Signed)
Patient's Name: Sarah Fisher  MRN: [id number]  Referring Provider: Ma Hillock, DO  DOB: 1960-07-27  PCP: Ma Hillock, DO  DOS: 10/01/2016  Note by: Kathlen Brunswick. Dossie Arbour, MD  Service setting: Ambulatory outpatient  Specialty: Interventional Pain Management  Location: [facility] (AMB) Pain Management Facility    Patient type: New Patient   Primary Reason(s) for Visit: Initial Patient Evaluation CC: Neck Pain (left )  HPI  Sarah Fisher is a 56 y.o. year old, female patient, who comes today for an initial evaluation. She has Hypertension; Cervical spine degeneration; Hyperlipidemia; BMI 28.0-28.9,adult; Vitamin D deficiency; Benign paroxysmal positional vertigo of left ear; Epigastric abdominal pain; Adenomatous polyp of colon; Chronic pain syndrome; Osteoarthritis of cervical spine; Cervical DDD (degenerative disc disease) (C5-6); Cervical spondylosis; Cervical central spinal stenosis C5-6 (Right); Chronic neck pain (Location of Primary Source of Pain) (Bilateral) (L>R); Cervicogenic headache (Bilateral) (R>L); Occipital headache (Bilateral) (R>L); Occipital neuralgia (Bilateral) (R>L); Neurogenic pain; Myofascial pain; Chronic shoulder pain (Location of Tertiary source of pain) (Bilateral) (L>R); Chronic upper back pain (Location of Secondary source of pain) (Bilateral) (L>R); Chronic low back pain (Bilateral) (L>R); Cervical spondylitis with radiculitis ([place]); Numbness of upper extremity (Bilateral) (L>R); Chronic cervical radiculopathy (Bilateral) (L>R); Chronic knee pain (Bilateral) (R>L); Osteoarthritis of knee (Bilateral) (R>L); Rheumatoid arthritis, seronegative, shoulder region (Bilateral) (L>R); Chronic idiopathic constipation; History of skin cancer in adulthood (eyelid); and History of anemia on her problem list.. Her primarily concern today is the Neck Pain (left )  Pain Assessment: Self-Reported Pain Score: 8  (took advil this morning.)/10 Clinically the patient looks like a 3/10 Reported  level is inconsistent with clinical observations. Information on the proper use of the pain scale provided to the patient today Pain Type: Chronic pain Pain Location: Neck Pain Orientation: Left Pain Descriptors / Indicators: Constant, Squeezing, Pressure (stiff) Pain Frequency: Constant  Onset and Duration: Gradual and Present longer than 3 months Cause of pain: Arthritis Severity: No change since onset, NAS-11 at its worse: 9/10, NAS-11 at its best: 7/10, NAS-11 now: 8/10 and NAS-11 on the average: 7/10 Timing: Morning, Night, During activity or exercise, After activity or exercise and After a period of immobility Aggravating Factors: Bending, Climbing, Kneeling, Lifiting, Prolonged sitting, Prolonged standing, Squatting, Walking, Walking uphill and Walking downhill Alleviating Factors: Stretching, Hot packs, Lying down, Medications, Resting, Sleeping, Relaxation therapy and Warm showers or baths Associated Problems: Constipation, Day-time cramps, Dizziness, Numbness, Spasms, Swelling, Temperature changes, Weakness, Pain that wakes patient up and Pain that does not allow patient to sleep Quality of Pain: Aching, Annoying, Intermittent, Cramping, Disabling, Exhausting, Feeling of weight, Getting longer, Nagging, Numb, Pressure-like, Pulsating and Uncomfortable Previous Examinations or Tests: Biopsy, Bone scan, CT scan, Endoscopy, MRI scan, Nerve block and X-rays Previous Treatments: Facet blocks, Pool exercises, Relaxation therapy, Stretching exercises and Trigger point injections  The patient comes into the clinics today for the first time for a chronic pain management evaluation. According to the patient her primary pain is that of the posterior aspect of the neck. This pain is bilateral with the left being worse than the right. The pain is located in the occipital region with the right being worst on the left. The patient's second worst pain is that of the upper back between the shoulder  blades. This pain also also bilateral with the left being worse than the right. Next follows the shoulder pain which is also bilateral with the left being worst on the right. Next is the low back pain in the  midline radiating bilaterally with the left being worse than the right. She also complains of bilateral upper extremity numbness with the left being worst on the right. In the case of the left arm the numbness goes all the way down to the middle finger while in the right arm and goes down to the ring finger. In addition she complains of bilateral knee pain with the right being worst on the left. The pain in the right knee is in the anterior patellar region. The next area of pain to follow is that of her wrist with the right being worst on the left. Tinel sign was positive on the right side for carpal tunnel syndrome but negative on the left. Phalen's sign was equivocal as it did not produce numbness in the fingers but she did experience pain in the wrists, bilaterally, with progressive numbness of both upper extremities as she was holding her arms up for the test. Next is the jaw pain, described to be bilateral and at one time it was injected by Dr. Angela Nevin (DDS). Next, the patient complains of bilateral ankle pain with the left being worst on the right. She also complains of intermittent epigastric pain especially when she bends over. This pain is described to be in the area of his xiphoid bone and it is believed to be costochondritis as it also worsens with pressure over the sternum. She also complains of intermittent muscle cramps.  Today I took the time to provide the patient with information regarding my pain practice. The patient was informed that my practice is divided into two sections: an interventional pain management section, as well as a completely separate and distinct medication management section. I explained that I have procedure days for my interventional therapies, and evaluation  days for follow-ups and medication management. Because of the amount of documentation required during both, they are kept separated. This means that there is the possibility that she may be scheduled for a procedure on one day, and medication management the next. I have also informed her that because of staffing and facility limitations, I no longer take patients for medication management only. To illustrate the reasons for this, I gave the patient the example of surgeons, and how inappropriate it would be to refer a patient to his/her care, just to write for the post-surgical antibiotics on a surgery done by a different surgeon.   Because interventional pain management is my board-certified specialty, the patient was informed that joining my practice means that they are open to any and all interventional therapies. I made it clear that this does not mean that they will be forced to have any procedures done. What this means is that I believe interventional therapies to be essential part of the diagnosis and proper management of chronic pain conditions. Therefore, patients not interested in these interventional alternatives will be better served under the care of a different practitioner.  The patient was also made aware of my Comprehensive Pain Management Safety Guidelines where by joining my practice, they limit all of their nerve blocks and joint injections to those done by our practice, for as long as we are retained to manage their care.   Historic Controlled Substance Pharmacotherapy Review  PMP and historical list of controlled substances: No opioids. Pharmacodynamics: N/A Historical Monitoring: The patient  reports that she does not use drugs.. List of all UDS Test(s): No results found for: MDMA, COCAINSCRNUR, PCPSCRNUR, PCPQUANT, CANNABQUANT, THCU, [place] List of all Serum Drug Screening Test(s):  No results found for: AMPHSCRSER, BARBSCRSER, BENZOSCRSER, COCAINSCRSER, PCPSCRSER, PCPQUANT,  THCSCRSER, CANNABQUANT, OPIATESCRSER, OXYSCRSER, PROPOXSCRSER Historical Background Evaluation: [place] PDMP: Six (6) year initial data search conducted.             [place] Department of public safety, offender search: Editor, commissioning Information) Non-contributory Risk Assessment Profile: Aberrant behavior: None observed or detected today Risk factors for fatal opioid overdose: None identified today Fatal overdose hazard ratio (HR): Calculation deferred Non-fatal overdose hazard ratio (HR): Calculation deferred Risk of opioid abuse or dependence: 0.7-3.0% with doses ? 36 MME/day and 6.1-26% with doses ? 120 MME/day. Substance use disorder (SUD) risk level: Pending results of Medical Psychology Evaluation for SUD Opioid risk tool (ORT) (Total Score): 0  ORT Scoring interpretation table:  Score <3 = Low Risk for SUD  Score between 4-7 = Moderate Risk for SUD  Score >8 = High Risk for Opioid Abuse   PHQ-2 Depression Scale:  Total score: 0  PHQ-2 Scoring interpretation table: (Score and probability of major depressive disorder)  Score 0 = No depression  Score 1 = 15.4% Probability  Score 2 = 21.1% Probability  Score 3 = 38.4% Probability  Score 4 = 45.5% Probability  Score 5 = 56.4% Probability  Score 6 = 78.6% Probability   PHQ-9 Depression Scale:  Total score: 0  PHQ-9 Scoring interpretation table:  Score 0-4 = No depression  Score 5-9 = Mild depression  Score 10-14 = Moderate depression  Score 15-19 = Moderately severe depression  Score 20-27 = Severe depression (2.4 times higher risk of SUD and 2.89 times higher risk of overuse)   Pharmacologic Plan: Today we will start a trial of tramadol, gabapentin, and Zanaflex.  Meds  The patient has a current medication list which includes the following prescription(s): famotidine-ca carb-mag hydrox, gabapentin, green tea, ibuprofen, losartan, multivitamin, simvastatin, tizanidine, and tramadol.  Current Outpatient Prescriptions on File Prior to Visit   Medication Sig  . Famotidine-Ca Carb-Mag Hydrox (PEPCID COMPLETE PO) Take by mouth as needed.  [name] Tea 315 MG CAPS Take by mouth daily.  Marland Kitchen ibuprofen (ADVIL,MOTRIN) 200 MG tablet Take 400 mg by mouth every 4 (four) hours as needed.   Marland Kitchen losartan (COZAAR) 50 MG tablet Take 1 tablet (50 mg total) by mouth daily.  . Multiple Vitamin (MULTIVITAMIN) tablet Take 1 tablet by mouth daily. With vit D  . simvastatin (ZOCOR) 10 MG tablet Take 1 tablet (10 mg total) by mouth daily.   No current facility-administered medications on file prior to visit.    Imaging Review  Cervical Imaging: Cervical MR wo contrast:  Results for orders placed in visit on 10/21/07  MR C Spine [facility] W/O Cm   Narrative * PRIOR REPORT IMPORTED FROM AN EXTERNAL SYSTEM *   PRIOR REPORT IMPORTED FROM THE [facility] WORKFLOW SYSTEM   REASON FOR EXAM:    neck pain  COMMENTS:   PROCEDURE:     MR  - MR CERVICAL SPINE WO CONT  - Oct 21 2007  8:32AM   RESULT:     Comparison: No available comparison exam.   Procedure: Multiplanar, multisequence MR examination of the cervical spine  was performed without gadolinium contrast.   Findings:   There is straightening the cervical spine without malalignment. The  cervical  cord and cervicomedullary junction are unremarkable. There is no  significant  bone marrow signal abnormality to suggest an acute fracture or a  suspicious  osseous lesion.   C5-C6: There is disc desiccation with mild disc  height loss. There is mild  right paracentral disc osteophyte complex. There is mild canal narrowing.  There is no significant neural foraminal narrowing.   There is mild multilevel disc desiccation. No significant disc herniation,  canal or neural foraminal narrowing is seen at the other cervical levels.  The left vertebral artery is dominant.   IMPRESSION:   1. There is mild right paracentral C5-C6 disc osteophyte complex with mild canal narrowing.   Thank you for this opportunity to  contribute to the care of your patient.       Note: Available results from prior imaging studies were reviewed. Results made available to patient  ROS  Cardiovascular History: Hypertension Pulmonary or Respiratory History: Snoring  Neurological History: Scoliosis Review of Past Neurological Studies: No results found for this or any previous visit. Psychological-Psychiatric History: Negative for anxiety, depression, schizophrenia, bipolar disorders or suicidal ideations or attempts Gastrointestinal History: Ulcers, Hiatal hernia, Reflux or heatburn and Constipation Genitourinary History: Hematuria Hematological History: Anemia Endocrine History: Negative for diabetes or thyroid disease Rheumatologic History: Osteoarthritis and Rheumatoid arthritis Musculoskeletal History: Negative for myasthenia gravis, muscular dystrophy, multiple sclerosis or malignant hyperthermia Work History: Unemployed  Allergies  Ms. Rodriges has No Known Allergies.  Laboratory Chemistry  Inflammation Markers Lab Results  Component Value Date   CRP <0.8 10/01/2016   ESRSEDRATE 21 10/01/2016   (CRP: Acute Phase) (ESR: Chronic Phase) Renal Function Markers Lab Results  Component Value Date   BUN 17 10/01/2016   CREATININE 0.61 10/01/2016   GFRAA >60 10/01/2016   GFRNONAA >60 10/01/2016   Hepatic Function Markers Lab Results  Component Value Date   AST 23 10/01/2016   ALT 18 10/01/2016   ALBUMIN 4.4 10/01/2016   ALKPHOS 57 10/01/2016   HCVAB NEGATIVE 05/15/2016   Electrolytes Lab Results  Component Value Date   NA 139 10/01/2016   K 3.9 10/01/2016   CL 105 10/01/2016   CALCIUM 9.4 10/01/2016   MG 2.2 10/01/2016   Neuropathy Markers Lab Results  Component Value Date   VITAMINB12 674 10/01/2016   Bone Pathology Markers Lab Results  Component Value Date   ALKPHOS 57 10/01/2016   VD25OH 26.44 (L) 09/03/2016   CALCIUM 9.4 10/01/2016   Coagulation Parameters Lab Results  Component  Value Date   PLT 284.0 05/15/2016   Cardiovascular Markers Lab Results  Component Value Date   HGB 12.5 05/15/2016   HCT 37.1 05/15/2016   Note: Lab results reviewed and explained to patient in Layman's terms.  [place]  Drug: Ms. Callejo  reports that she does not use drugs. Alcohol:  reports that she does not drink alcohol. Tobacco:  reports that she has never smoked. She has never used smokeless tobacco. Medical:  has a past medical history of Anemia; Arthritis; Cervical spine degeneration (2010); History of carpal tunnel syndrome; Hyperlipidemia; Hypertension; Sacroiliac joint pain (2010); and Scoliosis. Family: family history includes Alzheimer's disease in her maternal grandfather and paternal grandmother; Arthritis in her maternal aunt and mother; Colon cancer (age of onset: 39) in her maternal grandfather; Diabetes in her maternal grandfather; Hypertension in her father, maternal grandmother, mother, and sister; Kidney cancer in her maternal grandmother; Liver cancer in her father.  Past Surgical History:  Procedure Laterality Date  . BREAST SURGERY  1986   reduction  . cervical facet block  2010  . HAND SURGERY Bilateral 2000 and 2016   benign tumor removal   . [place]   Active Ambulatory Problems  Diagnosis Date Noted  . Hypertension   . Cervical spine degeneration 07/07/2008  . Hyperlipidemia 04/14/2016  . BMI 28.0-28.9,adult 05/15/2016  . Vitamin D deficiency 05/16/2016  . Benign paroxysmal positional vertigo of left ear 09/03/2016  . Epigastric abdominal pain 09/03/2016  . Adenomatous polyp of colon 09/03/2016  . Chronic pain syndrome 10/01/2016  . Osteoarthritis of cervical spine 10/01/2016  . Cervical DDD (degenerative disc disease) (C5-6) 10/01/2016  . Cervical spondylosis 10/01/2016  . Cervical central spinal stenosis C5-6 (Right) 10/01/2016  . Chronic neck pain (Location of Primary Source of Pain) (Bilateral) (L>R) 10/01/2016  .  Cervicogenic headache (Bilateral) (R>L) 10/01/2016  . Occipital headache (Bilateral) (R>L) 10/01/2016  . Occipital neuralgia (Bilateral) (R>L) 10/01/2016  . Neurogenic pain 10/01/2016  . Myofascial pain 10/01/2016  . Chronic shoulder pain (Location of Tertiary source of pain) (Bilateral) (L>R) 10/01/2016  . Chronic upper back pain (Location of Secondary source of pain) (Bilateral) (L>R) 10/01/2016  . Chronic low back pain (Bilateral) (L>R) 10/01/2016  . Cervical spondylitis with radiculitis ([place]) 10/01/2016  . Numbness of upper extremity (Bilateral) (L>R) 10/01/2016  . Chronic cervical radiculopathy (Bilateral) (L>R) 10/01/2016  . Chronic knee pain (Bilateral) (R>L) 10/01/2016  . Osteoarthritis of knee (Bilateral) (R>L) 10/01/2016  . Rheumatoid arthritis, seronegative, shoulder region (Bilateral) (L>R) 10/01/2016  . Chronic idiopathic constipation 10/01/2016  . History of skin cancer in adulthood (eyelid) 10/01/2016  . History of anemia 10/01/2016   Resolved Ambulatory Problems    Diagnosis Date Noted  . No Resolved Ambulatory Problems   Past Medical History:  Diagnosis Date  . Anemia   . Arthritis   . Cervical spine degeneration 2010  . History of carpal tunnel syndrome   . Hyperlipidemia   . Hypertension   . Sacroiliac joint pain 2010  . Scoliosis    Constitutional Exam  General appearance: Well nourished, well developed, and well hydrated. In no apparent acute distress Vitals:   10/01/16 1058  BP: 122/76  Pulse: 67  Resp: 16  Temp: 97.9 F (36.6 C)  TempSrc: Oral  Weight: 145 lb (65.8 kg)  Height: 5' (1.524 m)   BMI Assessment: Estimated body mass index is 28.32 kg/m as calculated from the following:   Height as of this encounter: 5' (1.524 m).   Weight as of this encounter: 145 lb (65.8 kg).  BMI interpretation table: BMI level Category Range association with higher incidence of chronic pain  <18 kg/m2 Underweight   18.5-24.9 kg/m2 Ideal body weight    25-29.9 kg/m2 Overweight Increased incidence by 20%  30-34.9 kg/m2 Obese (Class I) Increased incidence by 68%  35-39.9 kg/m2 Severe obesity (Class II) Increased incidence by 136%  >40 kg/m2 Extreme obesity (Class III) Increased incidence by 254%   BMI Readings from Last 4 Encounters:  10/01/16 28.32 kg/m  09/03/16 29.54 kg/m  08/01/16 28.90 kg/m  07/18/16 29.06 kg/m   Wt Readings from Last 4 Encounters:  10/01/16 145 lb (65.8 kg)  09/03/16 151 lb 4 oz (68.6 kg)  08/01/16 148 lb (67.1 kg)  07/18/16 148 lb 12.8 oz (67.5 kg)  Psych/Mental status: Alert, oriented x 3 (person, place, & time)       Eyes: PERLA Respiratory: No evidence of acute respiratory distress  Cervical Spine Exam  Inspection: No masses, redness, or swelling Alignment: Symmetrical Functional ROM: Decreased ROM Stability: No instability detected Muscle strength & Tone: Functionally intact Sensory: Movement-associated discomfort Palpation: No palpable anomalies  Upper Extremity (UE) Exam    Side: Right upper extremity  Side: Left upper extremity  Inspection: No masses, redness, swelling, or asymmetry. No contractures  Inspection: No masses, redness, swelling, or asymmetry. No contractures  Functional ROM: Guarding for shoulder  Functional ROM: Guarding for shoulder  Muscle strength & Tone: Functionally intact  Muscle strength & Tone: Functionally intact  Sensory: Unimpaired  Sensory: Unimpaired  Palpation: No palpable anomalies  Palpation: No palpable anomalies  Specialized Test(s): Deferred         Specialized Test(s): Deferred          Thoracic Spine Exam  Inspection: No masses, redness, or swelling Alignment: Symmetrical Functional ROM: Unrestricted ROM Stability: No instability detected Sensory: Unimpaired Muscle strength & Tone: No palpable anomalies  Lumbar Spine Exam  Inspection: No masses, redness, or swelling Alignment: Symmetrical Functional ROM: Decreased ROM Stability: No instability  detected Muscle strength & Tone: Functionally intact Sensory: Movement-associated discomfort Palpation: No palpable anomalies Provocative Tests: Lumbar Hyperextension and rotation test: Positive bilaterally for facet joint pain. Patrick's Maneuver: evaluation deferred today              Gait & Posture Assessment  Ambulation: Unassisted Gait: Relatively normal for age and body habitus Posture: WNL   Lower Extremity Exam    Side: Right lower extremity  Side: Left lower extremity  Inspection: No masses, redness, swelling, or asymmetry. No contractures  Inspection: No masses, redness, swelling, or asymmetry. No contractures  Functional ROM: Unrestricted ROM          Functional ROM: Unrestricted ROM          Muscle strength & Tone: Able to Toe-walk & Heel-walk without problems  Muscle strength & Tone: Able to Toe-walk & Heel-walk without problems  Sensory: Unimpaired  Sensory: Unimpaired  Palpation: No palpable anomalies  Palpation: No palpable anomalies   Assessment  Primary Diagnosis & Pertinent Problem List: The primary encounter diagnosis was Chronic neck pain. Diagnoses of Osteoarthritis of cervical spine, Cervical DDD (degenerative disc disease) (C6-7), Spondylosis of cervical region without myelopathy or radiculopathy, Chronic cervical radiculopathy (Bilateral) (L>R), Cervical spondylitis with radiculitis (HCC), Numbness of upper extremity (Bilateral) (L>R), Other osteoarthritis of spine, cervical region, Cervical central spinal stenosis C5-6 (Right), Cervicogenic headache (Bilateral) (R>L), Occipital headache (Bilateral) (R>L), Chronic shoulder pain (Location of Tertiary source of pain) (Bilateral) (L>R), Rheumatoid arthritis, seronegative, shoulder region (Bilateral) (L>R), Chronic upper back pain (Location of Secondary source of pain) (Bilateral) (L>R), Occipital neuralgia (Bilateral) (R>L), Chronic pain syndrome, Neurogenic pain, Myofascial pain, Chronic low back pain (Bilateral) (L>R),  Chronic knee pain (Bilateral) (R>L), Osteoarthritis of knee (Bilateral) (R>L), Chronic idiopathic constipation, History of skin cancer in adulthood (eyelid), and History of anemia were also pertinent to this visit.  Visit Diagnosis: 1. Chronic neck pain   2. Osteoarthritis of cervical spine   3. Cervical DDD (degenerative disc disease) (C6-7)   4. Spondylosis of cervical region without myelopathy or radiculopathy   5. Chronic cervical radiculopathy (Bilateral) (L>R)   6. Cervical spondylitis with radiculitis (HCC)   7. Numbness of upper extremity (Bilateral) (L>R)   8. Other osteoarthritis of spine, cervical region   9. Cervical central spinal stenosis C5-6 (Right)   10. Cervicogenic headache (Bilateral) (R>L)   11. Occipital headache (Bilateral) (R>L)   12. Chronic shoulder pain (Location of Tertiary source of pain) (Bilateral) (L>R)   13. Rheumatoid arthritis, seronegative, shoulder region (Bilateral) (L>R)   14. Chronic upper back pain (Location of Secondary source of pain) (Bilateral) (L>R)   15. Occipital neuralgia (Bilateral) (R>L)   16. Chronic pain syndrome  17. Neurogenic pain   18. Myofascial pain   19. Chronic low back pain (Bilateral) (L>R)   20. Chronic knee pain (Bilateral) (R>L)   21. Osteoarthritis of knee (Bilateral) (R>L)   22. Chronic idiopathic constipation   23. History of skin cancer in adulthood (eyelid)   24. History of anemia    Plan of Care  Initial treatment plan:  Please be advised that as per protocol, today's visit has been an evaluation only. We have not taken over the patient's controlled substance management.  Problem-specific plan: No problem-specific Assessment & Plan notes found for this encounter.  Ordered Lab-work, Procedure(s), Referral(s), & Consult(s): Orders Placed This Encounter  Procedures  . DG Cervical Spine Complete  . DG Knee 1-2 Views Left  . DG Knee 1-2 Views Right  . DG Lumbar Spine Complete W/Bend  . DG Shoulder Left  .  DG Shoulder Right  . Comprehensive metabolic panel  . C-reactive protein  . Magnesium  . Sedimentation rate  . Vitamin B12  . 25-Hydroxyvitamin D Lcms D2+D3  . Rheumatoid Arthritis Profile  . ANA w/Reflex if Positive   Pharmacotherapy: Medications ordered:  Meds ordered this encounter  Medications  . tizanidine (ZANAFLEX) 2 MG capsule    Sig: Take 1 capsule (2 mg total) by mouth 3 (three) times daily as needed for muscle spasms.    Dispense:  90 capsule    Refill:  0    Do not place this medication, or any other prescription from our practice, on "Automatic Refill". Patient may have prescription filled one day early if pharmacy is closed on scheduled refill date.  . gabapentin (NEURONTIN) 100 MG capsule    Sig: Take 1-3 capsules (100-300 mg total) by mouth every 8 (eight) hours.    Dispense:  90 capsule    Refill:  0    Do not place this medication, or any other prescription from our practice, on "Automatic Refill". Patient may have prescription filled one day early if pharmacy is closed on scheduled refill date.  . traMADol (ULTRAM) 50 MG tablet    Sig: Take 1 tablet (50 mg total) by mouth every 6 (six) hours as needed for severe pain.    Dispense:  120 tablet    Refill:  0    Do not place this medication, or any other prescription from our practice, on "Automatic Refill". Patient may have prescription filled one day early if pharmacy is closed on scheduled refill date. Do not fill until: 10/01/16 To last until: 10/31/16   Medications administered during this visit: Ms. Witz had no medications administered during this visit.   Pharmacotherapy under consideration:  Opioid Analgesics: The patient was informed that there is no guarantee that she would be a candidate for opioid analgesics. The decision will be made following CDC guidelines. This decision will be based on the results of diagnostic studies, as well as Ms. Wauneka's risk profile. Today we will do a trial of tramadol  50 mg 4 times a day when necessary. Membrane stabilizer: Gabapentin 100 mg trial Muscle relaxant: Zanaflex 2 mg trial NSAID: To be determined at a later time Other analgesic(s): Tramadol 50 mg trial   Interventional therapies under consideration: Ms. Kobrin was informed that there is no guarantee that she would be a candidate for interventional therapies. The decision will be based on the results of diagnostic studies, as well as Ms. Sottile's risk profile.  Possible procedure(s): Diagnostic left cervical epidural steroid injection under fluoroscopic guidance and IV sedation  Diagnostic bilateral cervical facet block under fluoroscopic guidance and IV sedation  Possible bilateral cervical facet RFA Diagnostic bilateral greater occipital nerve block Possible bilateral greater occipital RFA Diagnostic bilateral C2 + TON nerve block Possible bilateral C2 + TON RFA Possible bilateral occipital nerve stimulator trial Diagnostic bilateral lumbar facet block under fluoroscopic guidance and IV sedation  Possible bilateral lumbar facet RFA Diagnostic bilateral intra-articular knee joint injection with local anesthetic and steroid  Possible bilateral series of 5 intra-articular Hyalgan knee injections  Diagnostic bilateral Genicular nerve block  Possible bilateral Genicular nerve  Diagnostic costochondral/xiphoid local anesthetic and steroid injections    Provider-requested follow-up: Return in about 2 weeks (around 10/15/2016) for 2nd Visit, test result(s).  Future Appointments Date Time Provider [place]  10/21/2016 10:30 AM Milinda Pointer, MD [facility] None  11/13/2016 8:00 AM Renee Corky Mull, DO [facility] None    Primary Care Physician: Ma Hillock, DO Location: [facility] Note by: Kathlen Brunswick. Dossie Arbour, M.D, DABA, DABAPM, DABPM, DABIPP, FIPP Date: 10/01/2016; Time: 6:54 PM  Pain Score Disclaimer: We use the NRS-11 scale. This is a  self-reported, subjective measurement of pain severity with only modest accuracy. It is used primarily to identify changes within a particular patient. It must be understood that outpatient pain scales are significantly less accurate that those used for research, where they can be applied under ideal controlled circumstances with minimal exposure to variables. In reality, the score is likely to be a combination of pain intensity and pain affect, where pain affect describes the degree of emotional arousal or changes in action readiness caused by the sensory experience of pain. Factors such as social and work situation, setting, emotional state, anxiety levels, expectation, and prior pain experience may influence pain perception and show large inter-individual differences that may also be affected by time variables.  Patient instructions provided during this appointment: Patient Instructions    You were given a script for Tramadol today.  You were instructed to get labwork and xrays done today.    Pain Score  Introduction: The pain score used by this practice is the Verbal Numerical Rating Scale (VNRS-11). This is an 11-point scale. It is for adults and children 10 years or older. There are significant differences in how the pain score is reported, used, and applied. Forget everything you learned in the past and learn this scoring system.  General Information: The scale should reflect your current level of pain. Unless you are specifically asked for the level of your worst pain, or your average pain. If you are asked for one of these two, then it should be understood that it is over the past 24 hours.  Basic Activities of Daily Living (ADL): Personal hygiene, dressing, eating, transferring, and using restroom.  Instructions: Most patients tend to report their level of pain as a combination of two factors, their physical pain and their psychosocial pain. This last one is also known as "suffering" and it  is reflection of how physical pain affects you socially and psychologically. From now on, report them separately. From this point on, when asked to report your pain level, report only your physical pain. Use the following table for reference.  Pain Clinic Pain Levels (0-5/10)  Pain Level Score Description  No Pain 0   Mild pain 1 Nagging, annoying, but does not interfere with basic activities of daily living (ADL). Patients are able to eat, bathe, get dressed, toileting (being able to get on and off the toilet and  perform personal hygiene functions), transfer (move in and out of bed or a chair without assistance), and maintain continence (able to control bladder and bowel functions). Blood pressure and heart rate are unaffected. A normal heart rate for a healthy adult ranges from 60 to 100 bpm (beats per minute).   Mild to moderate pain 2 Noticeable and distracting. Impossible to hide from other people. More frequent flare-ups. Still possible to adapt and function close to normal. It can be very annoying and may have occasional stronger flare-ups. With discipline, patients may get used to it and adapt.   Moderate pain 3 Interferes significantly with activities of daily living (ADL). It becomes difficult to feed, bathe, get dressed, get on and off the toilet or to perform personal hygiene functions. Difficult to get in and out of bed or a chair without assistance. Very distracting. With effort, it can be ignored when deeply involved in activities.   Moderately severe pain 4 Impossible to ignore for more than a few minutes. With effort, patients may still be able to manage work or participate in some social activities. Very difficult to concentrate. Signs of autonomic nervous system discharge are evident: dilated pupils (mydriasis); mild sweating (diaphoresis); sleep interference. Heart rate becomes elevated (>115 bpm). Diastolic blood pressure (lower number) rises above 100 mmHg. Patients find relief in  laying down and not moving.   Severe pain 5 Intense and extremely unpleasant. Associated with frowning face and frequent crying. Pain overwhelms the senses.  Ability to do any activity or maintain social relationships becomes significantly limited. Conversation becomes difficult. Pacing back and forth is common, as getting into a comfortable position is nearly impossible. Pain wakes you up from deep sleep. Physical signs will be obvious: pupillary dilation; increased sweating; goosebumps; brisk reflexes; cold, clammy hands and feet; nausea, vomiting or dry heaves; loss of appetite; significant sleep disturbance with inability to fall asleep or to remain asleep. When persistent, significant weight loss is observed due to the complete loss of appetite and sleep deprivation.  Blood pressure and heart rate becomes significantly elevated. Caution: If elevated blood pressure triggers a pounding headache associated with blurred vision, then the patient should immediately seek attention at an urgent or emergency care unit, as these may be signs of an impending stroke.    Emergency Department Pain Levels (6-10/10)  Emergency Room Pain 6 Severely limiting. Requires emergency care and should not be seen or managed at an outpatient pain management facility. Communication becomes difficult and requires great effort. Assistance to reach the emergency department may be required. Facial flushing and profuse sweating along with potentially dangerous increases in heart rate and blood pressure will be evident.   Distressing pain 7 Self-care is very difficult. Assistance is required to transport, or use restroom. Assistance to reach the emergency department will be required. Tasks requiring coordination, such as bathing and getting dressed become very difficult.   Disabling pain 8 Self-care is no longer possible. At this level, pain is disabling. The individual is unable to do even the most "basic" activities such as  walking, eating, bathing, dressing, transferring to a bed, or toileting. Fine motor skills are lost. It is difficult to think clearly.   Incapacitating pain 9 Pain becomes incapacitating. Thought processing is no longer possible. Difficult to remember your own name. Control of movement and coordination are lost.   The worst pain imaginable 10 At this level, most patients pass out from pain. When this level is reached, collapse of the autonomic nervous system  occurs, leading to a sudden drop in blood pressure and heart rate. This in turn results in a temporary and dramatic drop in blood flow to the brain, leading to a loss of consciousness. Fainting is one of the body's self defense mechanisms. Passing out puts the brain in a calmed state and causes it to shut down for a while, in order to begin the healing process.    Summary: 1. Refer to this scale when providing [place] with your pain level. 2. Be accurate and careful when reporting your pain level. This will help with your care. 3. Over-reporting your pain level will lead to loss of credibility. 4. Even a level of 1/10 means that there is pain and will be treated at our facility. 5. High, inaccurate reporting will be documented as "Symptom Exaggeration", leading to loss of credibility and suspicions of possible secondary gains such as obtaining more narcotics, or wanting to appear disabled, for fraudulent reasons. 6. Only pain levels of 5 or below will be seen at our facility. 7. Pain levels of 6 and above will be sent to the Emergency Department and the appointment cancelled. _____________________________________________________________________________________________  Preparing for Procedure with Sedation Instructions: . Oral Intake: Do not eat or drink anything for at least 8 hours prior to your procedure. . Transportation: Public transportation is not allowed. Bring an adult driver. The driver must be physically present in our waiting room  before any procedure can be started. [place] Kitchen Physical Assistance: Bring an adult physically capable of assisting you, in the event you need help. This adult should keep you company at home for at least 6 hours after the procedure. . Blood Pressure Medicine: Take your blood pressure medicine with a sip of water the morning of the procedure. . Blood thinners:  . Diabetics on insulin: Notify the staff so that you can be scheduled 1st case in the morning. If your diabetes requires high dose insulin, take only  of your normal insulin dose the morning of the procedure and notify the staff that you have done so. . Preventing infections: Shower with an antibacterial soap the morning of your procedure. . Build-up your immune system: Take 1000 mg of Vitamin C with every meal (3 times a day) the day prior to your procedure. Marland Kitchen Antibiotics: Inform the staff if you have a condition or reason that requires you to take antibiotics before dental procedures. . Pregnancy: If you are pregnant, call and cancel the procedure. . Sickness: If you have a cold, fever, or any active infections, call and cancel the procedure. . Arrival: You must be in the facility at least 30 minutes prior to your scheduled procedure. . Children: Do not bring children with you. . Dress appropriately: Bring dark clothing that you would not mind if they get stained. . Valuables: Do not bring any jewelry or valuables. Procedure appointments are reserved for interventional treatments only. Marland Kitchen No Prescription Refills. . No medication changes will be discussed during procedure appointments. . No disability issues will be discussed.  ____________________________________________________________________________________________  Initial Gabapentin Titration  Medication used: Gabapentin (Generic Name) or Neurontin (Brand Name) 100 mg tablets/capsules  Reasons to stop increasing the dose:  Reason 1: You get good relief of symptoms, in which case there  is no need to increase the daily dose any further.    Reason 2: You develop some side effects, such as sleeping all of the time, difficulty concentrating, or becoming disoriented, in which case you need to go down on the dose, to  the prior level, where you were not experiencing any side effects. Stay on that dose longer, to allow more time for your body to get use it, before attempting to increase it again.   Steps: Step 1: Start by taking 1 (one) tablet at bedtime x 7 (seven) days.  Step 2: After being on 1 (one) tablet for 7 (seven) days, then increase it to 2 (two) tablets at bedtime for another 7 (seven) days.  Step 3: Next, after being on 2 (two) tablets at bedtime for 7 (seven) days, then increase it to 3 (three) tablets at bedtime, and stay on that dose until you see your doctor.  Reasons to stop increasing the dose: Reason 1: You get good relief of symptoms, in which case there is no need to increase the daily dose any further.  Reason 2: You develop some side effects, such as sleeping all of the time, difficulty concentrating, or becoming disoriented, in which case you need to go down on the dose, to the prior level, where you were not experiencing any side effects. Stay on that dose longer, to allow more time for your body to get use it, before attempting to increase it again.  Endpoint: Once you have reached the maximum dose you can tolerate without side-effects, contact your physician so as to evaluate the results of the regimen.   Questions: Feel free to contact us for any questions or problems at [phone]

## 2016-10-01 NOTE — Progress Notes (Signed)
Safety precautions to be maintained throughout the outpatient stay will include: orient to surroundings, keep bed in low position, maintain call bell within reach at all times, provide assistance with transfer out of bed and ambulation.  

## 2016-10-01 NOTE — Patient Instructions (Addendum)
You were given a script for Tramadol today.  You were instructed to get labwork and xrays done today.    Pain Score  Introduction: The pain score used by this practice is the Verbal Numerical Rating Scale (VNRS-11). This is an 11-point scale. It is for adults and children 10 years or older. There are significant differences in how the pain score is reported, used, and applied. Forget everything you learned in the past and learn this scoring system.  General Information: The scale should reflect your current level of pain. Unless you are specifically asked for the level of your worst pain, or your average pain. If you are asked for one of these two, then it should be understood that it is over the past 24 hours.  Basic Activities of Daily Living (ADL): Personal hygiene, dressing, eating, transferring, and using restroom.  Instructions: Most patients tend to report their level of pain as a combination of two factors, their physical pain and their psychosocial pain. This last one is also known as "suffering" and it is reflection of how physical pain affects you socially and psychologically. From now on, report them separately. From this point on, when asked to report your pain level, report only your physical pain. Use the following table for reference.  Pain Clinic Pain Levels (0-5/10)  Pain Level Score Description  No Pain 0   Mild pain 1 Nagging, annoying, but does not interfere with basic activities of daily living (ADL). Patients are able to eat, bathe, get dressed, toileting (being able to get on and off the toilet and perform personal hygiene functions), transfer (move in and out of bed or a chair without assistance), and maintain continence (able to control bladder and bowel functions). Blood pressure and heart rate are unaffected. A normal heart rate for a healthy adult ranges from 60 to 100 bpm (beats per minute).   Mild to moderate pain 2 Noticeable and distracting. Impossible to hide from  other people. More frequent flare-ups. Still possible to adapt and function close to normal. It can be very annoying and may have occasional stronger flare-ups. With discipline, patients may get used to it and adapt.   Moderate pain 3 Interferes significantly with activities of daily living (ADL). It becomes difficult to feed, bathe, get dressed, get on and off the toilet or to perform personal hygiene functions. Difficult to get in and out of bed or a chair without assistance. Very distracting. With effort, it can be ignored when deeply involved in activities.   Moderately severe pain 4 Impossible to ignore for more than a few minutes. With effort, patients may still be able to manage work or participate in some social activities. Very difficult to concentrate. Signs of autonomic nervous system discharge are evident: dilated pupils (mydriasis); mild sweating (diaphoresis); sleep interference. Heart rate becomes elevated (>115 bpm). Diastolic blood pressure (lower number) rises above 100 mmHg. Patients find relief in laying down and not moving.   Severe pain 5 Intense and extremely unpleasant. Associated with frowning face and frequent crying. Pain overwhelms the senses.  Ability to do any activity or maintain social relationships becomes significantly limited. Conversation becomes difficult. Pacing back and forth is common, as getting into a comfortable position is nearly impossible. Pain wakes you up from deep sleep. Physical signs will be obvious: pupillary dilation; increased sweating; goosebumps; brisk reflexes; cold, clammy hands and feet; nausea, vomiting or dry heaves; loss of appetite; significant sleep disturbance with inability to fall asleep or to remain asleep.  When persistent, significant weight loss is observed due to the complete loss of appetite and sleep deprivation.  Blood pressure and heart rate becomes significantly elevated. Caution: If elevated blood pressure triggers a pounding  headache associated with blurred vision, then the patient should immediately seek attention at an urgent or emergency care unit, as these may be signs of an impending stroke.    Emergency Department Pain Levels (6-10/10)  Emergency Room Pain 6 Severely limiting. Requires emergency care and should not be seen or managed at an outpatient pain management facility. Communication becomes difficult and requires great effort. Assistance to reach the emergency department may be required. Facial flushing and profuse sweating along with potentially dangerous increases in heart rate and blood pressure will be evident.   Distressing pain 7 Self-care is very difficult. Assistance is required to transport, or use restroom. Assistance to reach the emergency department will be required. Tasks requiring coordination, such as bathing and getting dressed become very difficult.   Disabling pain 8 Self-care is no longer possible. At this level, pain is disabling. The individual is unable to do even the most "basic" activities such as walking, eating, bathing, dressing, transferring to a bed, or toileting. Fine motor skills are lost. It is difficult to think clearly.   Incapacitating pain 9 Pain becomes incapacitating. Thought processing is no longer possible. Difficult to remember your own name. Control of movement and coordination are lost.   The worst pain imaginable 10 At this level, most patients pass out from pain. When this level is reached, collapse of the autonomic nervous system occurs, leading to a sudden drop in blood pressure and heart rate. This in turn results in a temporary and dramatic drop in blood flow to the brain, leading to a loss of consciousness. Fainting is one of the body's self defense mechanisms. Passing out puts the brain in a calmed state and causes it to shut down for a while, in order to begin the healing process.    Summary: 1. Refer to this scale when providing [place] with your pain  level. 2. Be accurate and careful when reporting your pain level. This will help with your care. 3. Over-reporting your pain level will lead to loss of credibility. 4. Even a level of 1/10 means that there is pain and will be treated at our facility. 5. High, inaccurate reporting will be documented as "Symptom Exaggeration", leading to loss of credibility and suspicions of possible secondary gains such as obtaining more narcotics, or wanting to appear disabled, for fraudulent reasons. 6. Only pain levels of 5 or below will be seen at our facility. 7. Pain levels of 6 and above will be sent to the Emergency Department and the appointment cancelled. _____________________________________________________________________________________________  Preparing for Procedure with Sedation Instructions: . Oral Intake: Do not eat or drink anything for at least 8 hours prior to your procedure. . Transportation: Public transportation is not allowed. Bring an adult driver. The driver must be physically present in our waiting room before any procedure can be started. [facility] Physical Assistance: Bring an adult physically capable of assisting you, in the event you need help. This adult should keep you company at home for at least 6 hours after the procedure. . Blood Pressure Medicine: Take your blood pressure medicine with a sip of water the morning of the procedure. . Blood thinners:  . Diabetics on insulin: Notify the staff so that you can be scheduled 1st case in the morning. If your diabetes requires high dose  insulin, take only  of your normal insulin dose the morning of the procedure and notify the staff that you have done so. . Preventing infections: Shower with an antibacterial soap the morning of your procedure. . Build-up your immune system: Take 1000 mg of Vitamin C with every meal (3 times a day) the day prior to your procedure. [facility] Antibiotics: Inform the staff if you have a condition or reason that requires  you to take antibiotics before dental procedures. . Pregnancy: If you are pregnant, call and cancel the procedure. . Sickness: If you have a cold, fever, or any active infections, call and cancel the procedure. . Arrival: You must be in the facility at least 30 minutes prior to your scheduled procedure. . Children: Do not bring children with you. . Dress appropriately: Bring dark clothing that you would not mind if they get stained. . Valuables: Do not bring any jewelry or valuables. Procedure appointments are reserved for interventional treatments only. [facility] No Prescription Refills. . No medication changes will be discussed during procedure appointments. . No disability issues will be discussed.  ____________________________________________________________________________________________  Initial Gabapentin Titration  Medication used: Gabapentin (Generic Name) or Neurontin (Brand Name) 100 mg tablets/capsules  Reasons to stop increasing the dose:  Reason 1: You get good relief of symptoms, in which case there is no need to increase the daily dose any further.    Reason 2: You develop some side effects, such as sleeping all of the time, difficulty concentrating, or becoming disoriented, in which case you need to go down on the dose, to the prior level, where you were not experiencing any side effects. Stay on that dose longer, to allow more time for your body to get use it, before attempting to increase it again.   Steps: Step 1: Start by taking 1 (one) tablet at bedtime x 7 (seven) days.  Step 2: After being on 1 (one) tablet for 7 (seven) days, then increase it to 2 (two) tablets at bedtime for another 7 (seven) days.  Step 3: Next, after being on 2 (two) tablets at bedtime for 7 (seven) days, then increase it to 3 (three) tablets at bedtime, and stay on that dose until you see your doctor.  Reasons to stop increasing the dose: Reason 1: You get good relief of symptoms, in which case  there is no need to increase the daily dose any further.  Reason 2: You develop some side effects, such as sleeping all of the time, difficulty concentrating, or becoming disoriented, in which case you need to go down on the dose, to the prior level, where you were not experiencing any side effects. Stay on that dose longer, to allow more time for your body to get use it, before attempting to increase it again.  Endpoint: Once you have reached the maximum dose you can tolerate without side-effects, contact your physician so as to evaluate the results of the regimen.   Questions: Feel free to contact us for any questions or problems at [phone]

## 2016-10-02 LAB — RHEUMATOID ARTHRITIS PROFILE
CCP Antibodies IgG/IgA: 3 U (ref 0–19)
Rheumatoid fact SerPl-aCnc: <10 [IU]/mL (ref 0.0–13.9)

## 2016-10-02 LAB — ANA W/REFLEX IF POSITIVE: Anti Nuclear Antibody(ANA): NEGATIVE

## 2016-10-04 LAB — 25-HYDROXY VITAMIN D LCMS D2+D3
25-Hydroxy, Vitamin D-2: 18 ng/mL
25-Hydroxy, Vitamin D: 40 ng/mL

## 2016-10-04 LAB — 25-HYDROXYVITAMIN D LCMS D2+D3: 25-HYDROXY, VITAMIN D-3: 22 ng/mL

## 2016-10-21 ENCOUNTER — Ambulatory Visit: Payer: BLUE CROSS/BLUE SHIELD | Attending: Pain Medicine | Admitting: Pain Medicine

## 2016-10-21 ENCOUNTER — Encounter: Payer: Self-pay | Admitting: Pain Medicine

## 2016-10-21 VITALS — BP 130/91 | Temp 98.2°F | Resp 16 | Ht 60.0 in | Wt 144.0 lb

## 2016-10-21 DIAGNOSIS — M4692 Unspecified inflammatory spondylopathy, cervical region: Secondary | ICD-10-CM

## 2016-10-21 DIAGNOSIS — M25512 Pain in left shoulder: Secondary | ICD-10-CM | POA: Insufficient documentation

## 2016-10-21 DIAGNOSIS — Z79899 Other long term (current) drug therapy: Secondary | ICD-10-CM | POA: Insufficient documentation

## 2016-10-21 DIAGNOSIS — M791 Myalgia: Secondary | ICD-10-CM | POA: Insufficient documentation

## 2016-10-21 DIAGNOSIS — M4802 Spinal stenosis, cervical region: Secondary | ICD-10-CM | POA: Diagnosis not present

## 2016-10-21 DIAGNOSIS — G894 Chronic pain syndrome: Secondary | ICD-10-CM | POA: Diagnosis not present

## 2016-10-21 DIAGNOSIS — G8929 Other chronic pain: Secondary | ICD-10-CM

## 2016-10-21 DIAGNOSIS — M25511 Pain in right shoulder: Secondary | ICD-10-CM | POA: Insufficient documentation

## 2016-10-21 DIAGNOSIS — M546 Pain in thoracic spine: Secondary | ICD-10-CM | POA: Diagnosis not present

## 2016-10-21 DIAGNOSIS — M792 Neuralgia and neuritis, unspecified: Secondary | ICD-10-CM | POA: Diagnosis not present

## 2016-10-21 DIAGNOSIS — M5412 Radiculopathy, cervical region: Secondary | ICD-10-CM

## 2016-10-21 DIAGNOSIS — F1190 Opioid use, unspecified, uncomplicated: Secondary | ICD-10-CM | POA: Insufficient documentation

## 2016-10-21 DIAGNOSIS — M542 Cervicalgia: Secondary | ICD-10-CM | POA: Diagnosis not present

## 2016-10-21 DIAGNOSIS — M545 Low back pain: Secondary | ICD-10-CM | POA: Diagnosis not present

## 2016-10-21 DIAGNOSIS — M4722 Other spondylosis with radiculopathy, cervical region: Secondary | ICD-10-CM | POA: Diagnosis not present

## 2016-10-21 DIAGNOSIS — Z79891 Long term (current) use of opiate analgesic: Secondary | ICD-10-CM | POA: Diagnosis not present

## 2016-10-21 DIAGNOSIS — M4682 Other specified inflammatory spondylopathies, cervical region: Secondary | ICD-10-CM

## 2016-10-21 DIAGNOSIS — M7918 Myalgia, other site: Secondary | ICD-10-CM

## 2016-10-21 MED ORDER — TRAMADOL HCL 50 MG PO TABS: 50.0000 mg | ORAL_TABLET | Freq: Four times a day (QID) | ORAL | 2 refills | Status: DC | PRN

## 2016-10-21 MED ORDER — GABAPENTIN 100 MG PO CAPS: 100.0000 mg | ORAL_CAPSULE | Freq: Three times a day (TID) | ORAL | 0 refills | Status: DC

## 2016-10-21 MED ORDER — TIZANIDINE HCL 2 MG PO CAPS: 2.0000 mg | ORAL_CAPSULE | Freq: Three times a day (TID) | ORAL | 2 refills | Status: DC | PRN

## 2016-10-21 NOTE — Progress Notes (Signed)
Patient's Name: Sarah Fisher  MRN: [id number]  Referring Provider: Ma Hillock, DO  DOB: 03/29/1961  PCP: Ma Hillock, DO  DOS: 10/21/2016  Note by: Kathlen Brunswick. Dossie Arbour, MD  Service setting: Ambulatory outpatient  Specialty: Interventional Pain Management  Location: [facility] (AMB) Pain Management Facility    Patient type: Established   Primary Reason(s) for Visit: Encounter for evaluation before starting new chronic pain management plan of care (Level of risk: moderate) CC: Neck Pain  HPI  [name] is a 56 y.o. year old, female patient, who comes today for a follow-up evaluation to review the test results and decide on a treatment plan. She has Hypertension; Cervical spine degeneration; Hyperlipidemia; BMI 28.0-28.9,adult; Vitamin D deficiency; Benign paroxysmal positional vertigo of left ear; Epigastric abdominal pain; Adenomatous polyp of colon; Chronic pain syndrome; Osteoarthritis of cervical spine; Cervical DDD (degenerative disc disease) (C5-6); Cervical spondylosis; Cervical central spinal stenosis C5-6 (Right); Chronic neck pain (Location of Primary Source of Pain) (Bilateral) (L>R); Cervicogenic headache (Bilateral) (R>L); Occipital headache (Bilateral) (R>L); Occipital neuralgia (Bilateral) (R>L); Neurogenic pain; Myofascial pain; Chronic shoulder pain (Location of Tertiary source of pain) (Bilateral) (L>R); Chronic upper back pain (Location of Secondary source of pain) (Bilateral) (L>R); Chronic low back pain (Bilateral) (L>R); Cervical spondylitis with radiculitis ([place]); Numbness of upper extremity (Bilateral) (L>R); Chronic cervical radiculopathy (Bilateral) (L>R); Chronic knee pain (Bilateral) (R>L); Osteoarthritis of knee (Bilateral) (R>L); Rheumatoid arthritis, seronegative, shoulder region (Bilateral) (L>R); Chronic idiopathic constipation; History of skin cancer in adulthood (eyelid); History of anemia; Long term (current) use of opiate analgesic; Long term prescription opiate  use; and Opiate use on her problem list. Her primarily concern today is the Neck Pain  Pain Assessment: Self-Reported Pain Score: 6 /10 Clinically the patient looks like a 2/10 Reported level is inconsistent with clinical observations. Information on the proper use of the pain scale provided to the patient today Pain Type: Chronic pain Pain Location: Neck Pain Descriptors / Indicators: Heaviness Pain Frequency: Constant  [name] comes in today for a follow-up visit after her initial evaluation on 10/01/2016. Today we went over the results of her tests. These were explained in "Layman's terms". During today's appointment we went over my diagnostic impression, as well as the proposed treatment plan. She indicates doing rather well on the Neurontin which is the only medication that she has been taking regularly. She also indicates that he has tried to avoid taking the tramadol due to the fact of the prescription says that it should be for "severe pain". Today I had a conversation with her regarding this and the fact that she should use the medication if she needs it and she should not be afraid of it. Her concern is that of being sleepy she was instructed to try it during controlled circumstances so that she can determine whether or not it does make her sleepy. At similar concerns with the Zanaflex. Since she is not having any problems with any of the 3 medications, we will refill them and provide her with enough medication to last for 3 to 6 months. In addition, today we went over the results of all of the testing and we have scheduled her to return for a diagnostic cervical epidural steroid injection.  In considering the treatment plan options, [name] was reminded that I no longer take patients for medication management only. I asked her to let me know if she had no intention of taking advantage of the interventional therapies, so that we could  make arrangements to provide this space to someone  interested. I also made it clear that undergoing interventional therapies for the purpose of getting pain medications is very inappropriate on the part of a patient, and it will not be tolerated in this practice. This type of behavior would suggest true addiction and therefore it requires referral to an addiction specialist.   Further details on both, my assessment(s), as well as the proposed treatment plan, please see below. Controlled Substance Pharmacotherapy Assessment REMS (Risk Evaluation and Mitigation Strategy)  Analgesic: Tramadol 50 mg 1 tablet by mouth 4 times a day (200 mg/day of tramadol) MME/day: 20 mg/day. Pill Count: None expected due to no prior prescriptions written by our practice. Pharmacokinetics: Liberation and absorption (onset of action): WNL Distribution (time to peak effect): WNL Metabolism and excretion (duration of action): WNL         Pharmacodynamics: Desired effects: Analgesia: [name] reports >50% benefit. Functional ability: Patient reports that medication allows her to accomplish basic ADLs Clinically meaningful improvement in function (CMIF): Sustained CMIF goals met Perceived effectiveness: Described as relatively effective, allowing for increase in activities of daily living (ADL) Undesirable effects: Side-effects or Adverse reactions: None reported Monitoring: [place] PMP: Online review of the past 10-monthperiod previously conducted. Not applicable at this point since we have not taken over the patient's medication management yet. List of all Serum Drug Screening Test(s):  No results found for: AMPHSCRSER, BARBSCRSER, BENZOSCRSER, COCAINSCRSER, PCPSCRSER, [place] test(s[place] done:  No results found for: TOXASSSELUR, SUMMARY Last UDS on record: No results found for: TOXASSSELUR, SUMMARY UDS interpretation: No unexpected findings.          Medication Assessment Form: Patient introduced to form  today Treatment compliance: Treatment may start today if patient agrees with proposed plan. Evaluation of compliance is not applicable at this point Risk Assessment Profile: Aberrant behavior: See initial evaluations. None observed or detected today Comorbid factors increasing risk of overdose: See initial evaluation. No additional risks detected today Risk Mitigation Strategies:  Patient opioid safety counseling: Completed today. Counseling provided to patient as per "Patient Counseling Document". Document signed by patient, attesting to counseling and understanding Patient-Prescriber Agreement (PPA): Obtained today  Controlled substance notification to other providers: Written and sent today  Pharmacologic Plan: Today we may be taking over the patient's pharmacological regimen. See below  Laboratory Chemistry  Inflammation Markers Lab Results  Component Value Date   CRP <0.8 10/01/2016   ESRSEDRATE 21 10/01/2016   (CRP: Acute Phase) (ESR: Chronic Phase) Renal Function Markers Lab Results  Component Value Date   BUN 17 10/01/2016   CREATININE 0.61 10/01/2016   GFRAA >60 10/01/2016   GFRNONAA >60 10/01/2016   Hepatic Function Markers Lab Results  Component Value Date   AST 23 10/01/2016   ALT 18 10/01/2016   ALBUMIN 4.4 10/01/2016   ALKPHOS 57 10/01/2016   HCVAB NEGATIVE 05/15/2016   Electrolytes Lab Results  Component Value Date   NA 139 10/01/2016   K 3.9 10/01/2016   CL 105 10/01/2016   CALCIUM 9.4 10/01/2016   MG 2.2 10/01/2016   Neuropathy Markers Lab Results  Component Value Date   VITAMINB12 674 10/01/2016   Bone Pathology Markers Lab Results  Component Value Date   ALKPHOS 57 10/01/2016   VD25OH 26.44 (L) 09/03/2016   25OHVITD1 40 10/01/2016   25OHVITD2 18 10/01/2016   25OHVITD3 22 10/01/2016   CALCIUM 9.4 10/01/2016   Coagulation Parameters Lab Results  Component Value Date   PLT 284.0 05/15/2016   Cardiovascular Markers Lab Results   Component Value Date   HGB 12.5 05/15/2016   HCT 37.1 05/15/2016   Note: Lab results reviewed.  Recent Diagnostic Imaging Review  Dg Cervical Spine Complete Result Date: 10/01/2016 CLINICAL DATA:  Cervical spasms from many years radiating to the shoulder and arm EXAM: CERVICAL SPINE - COMPLETE 4+ VIEW COMPARISON:  MR C-spine of 10/21/2007 FINDINGS: The cervical vertebrae are in normal alignment. There is degenerative disc disease primarily at C5-6 where there is loss of disc space and sclerosis with spurring. The remainder of intervertebral disc spaces appear normal. No prevertebral soft tissue swelling is seen. On oblique views, there is only mild foraminal narrowing bilaterally at C5-6. The odontoid process is intact. The lung apices are clear. IMPRESSION: 1. Normal alignment. 2. Degenerative disc disease at C5-6 where there is mild foraminal narrowing bilaterally. Electronically Signed   By: Ivar Drape M.D.   On: 10/01/2016 14:57   Dg Lumbar Spine Complete W/bend Result Date: 10/01/2016 CLINICAL DATA:  Low back pain, no trauma EXAM: LUMBAR SPINE - COMPLETE WITH BENDING VIEWS COMPARISON:  None. FINDINGS: The lumbar vertebrae are in normal alignment. Intervertebral disc spaces appear normal. No compression deformity is seen. On oblique views no significant degenerative change is noted involving the facet joints. Through flexion and extension there is relatively normal range of motion with no malalignment. IMPRESSION: 1. Normal alignment with normal intervertebral disc spaces. 2. Normal range of motion through flexion and extension. Electronically Signed   By: Ivar Drape M.D.   On: 10/01/2016 14:59   Dg Shoulder Right Result Date: 10/01/2016 CLINICAL DATA:  Shoulder pain, no trauma EXAM: RIGHT SHOULDER - 2+ VIEW COMPARISON:  None. FINDINGS: The right humeral head is in normal position and the glenohumeral joint space appears normal. There is calcification lying over the lateral aspect of the  humeral head which could represent calcific tendinitis. The right [facility] joint is normally aligned. IMPRESSION: 1. No acute abnormality. 2. Calcification could indicate chronic calcific tendinitis. Electronically Signed   By: Ivar Drape M.D.   On: 10/01/2016 15:01   Dg Knee 1-2 Views Left Result Date: 10/01/2016 CLINICAL DATA:  Bilateral knee pain, no injury EXAM: LEFT KNEE - 1-2 VIEW COMPARISON:  None. FINDINGS: The left knee joint spaces appear normal. No significant degenerative joint disease is seen. No joint effusion is noted. IMPRESSION: Negative. Electronically Signed   By: Ivar Drape M.D.   On: 10/01/2016 14:58   Dg Knee 1-2 Views Right Result Date: 10/01/2016 CLINICAL DATA:  Bilateral knee pain, no trauma EXAM: RIGHT KNEE - 1-2 VIEW COMPARISON:  None. FINDINGS: Views of the right knee show well preserved knee joint spaces. No fracture is seen. No significant degenerative joint disease is noted. No joint effusion is seen. IMPRESSION: Negative. Electronically Signed   By: Ivar Drape M.D.   On: 10/01/2016 14:58   Dg Shoulder Left Result Date: 10/01/2016 CLINICAL DATA:  Shoulder pain, no trauma EXAM: LEFT SHOULDER - 2+ VIEW COMPARISON:  None. FINDINGS: The left humeral head is in normal position. The left glenohumeral joint space appears normal. The left AC joint is normally aligned. IMPRESSION: Negative. Electronically Signed   By: Ivar Drape M.D.   On: 10/01/2016 15:00   Cervical Imaging: Cervical MR wo contrast:  Results for orders placed in visit on 10/21/07  [place] W/O Cm   Narrative * PRIOR REPORT IMPORTED FROM AN EXTERNAL SYSTEM *  PRIOR REPORT IMPORTED FROM THE [employer] WORKFLOW SYSTEM   REASON FOR EXAM:    neck pain  COMMENTS:   PROCEDURE:     MR  - MR CERVICAL SPINE WO CONT  - Oct 21 2007  8:32AM   RESULT:     Comparison: No available comparison exam.   Procedure: Multiplanar, multisequence MR examination of the cervical spine  was performed without gadolinium  contrast.   Findings:   There is straightening the cervical spine without malalignment. The  cervical  cord and cervicomedullary junction are unremarkable. There is no  significant  bone marrow signal abnormality to suggest an acute fracture or a  suspicious  osseous lesion.   C5-C6: There is disc desiccation with mild disc height loss. There is mild  right paracentral disc osteophyte complex. There is mild canal narrowing.  There is no significant neural foraminal narrowing.   There is mild multilevel disc desiccation. No significant disc herniation,  canal or neural foraminal narrowing is seen at the other cervical levels.  The left vertebral artery is dominant.   IMPRESSION:   1. There is mild right paracentral C5-C6 disc osteophyte complex with mild  canal narrowing.   Thank you for this opportunity to contribute to the care of your patient.       Cervical DG complete:  Results for orders placed during the hospital encounter of 10/01/16  DG Cervical Spine Complete   Narrative CLINICAL DATA:  Cervical spasms from many years radiating to the shoulder and arm  EXAM: CERVICAL SPINE - COMPLETE 4+ VIEW  COMPARISON:  MR C-spine of 10/21/2007  FINDINGS: The cervical vertebrae are in normal alignment. There is degenerative disc disease primarily at C5-6 where there is loss of disc space and sclerosis with spurring. The remainder of intervertebral disc spaces appear normal. No prevertebral soft tissue swelling is seen. On oblique views, there is only mild foraminal narrowing bilaterally at C5-6. The odontoid process is intact. The lung apices are clear.  IMPRESSION: 1. Normal alignment. 2. Degenerative disc disease at C5-6 where there is mild foraminal narrowing bilaterally.   Electronically Signed   By: Ivar Drape M.D.   On: 10/01/2016 14:57    Shoulder Imaging: Shoulder-R DG:  Results for orders placed during the hospital encounter of 10/01/16  DG Shoulder  Right   Narrative CLINICAL DATA:  Shoulder pain, no trauma  EXAM: RIGHT SHOULDER - 2+ VIEW  COMPARISON:  None.  FINDINGS: The right humeral head is in normal position and the glenohumeral joint space appears normal. There is calcification lying over the lateral aspect of the humeral head which could represent calcific tendinitis. The right [facility] joint is normally aligned.  IMPRESSION: 1. No acute abnormality. 2. Calcification could indicate chronic calcific tendinitis.   Electronically Signed   By: Ivar Drape M.D.   On: 10/01/2016 15:01    Shoulder-L DG:  Results for orders placed during the hospital encounter of 10/01/16  DG Shoulder Left   Narrative CLINICAL DATA:  Shoulder pain, no trauma  EXAM: LEFT SHOULDER - 2+ VIEW  COMPARISON:  None.  FINDINGS: The left humeral head is in normal position. The left glenohumeral joint space appears normal. The left AC joint is normally aligned.  IMPRESSION: Negative.   Electronically Signed   By: Ivar Drape M.D.   On: 10/01/2016 15:00    Lumbosacral Imaging: Lumbar DG Bending views:  Results for orders placed during the hospital encounter of 10/01/16  DG Lumbar Spine Complete W/Bend  Narrative CLINICAL DATA:  Low back pain, no trauma  EXAM: LUMBAR SPINE - COMPLETE WITH BENDING VIEWS  COMPARISON:  None.  FINDINGS: The lumbar vertebrae are in normal alignment. Intervertebral disc spaces appear normal. No compression deformity is seen. On oblique views no significant degenerative change is noted involving the facet joints. Through flexion and extension there is relatively normal range of motion with no malalignment.  IMPRESSION: 1. Normal alignment with normal intervertebral disc spaces. 2. Normal range of motion through flexion and extension.   Electronically Signed   By: Ivar Drape M.D.   On: 10/01/2016 14:59    Knee Imaging: Knee-R DG 1-2 views:  Results for orders placed during the hospital  encounter of 10/01/16  DG Knee 1-2 Views Right   Narrative CLINICAL DATA:  Bilateral knee pain, no trauma  EXAM: RIGHT KNEE - 1-2 VIEW  COMPARISON:  None.  FINDINGS: Views of the right knee show well preserved knee joint spaces. No fracture is seen. No significant degenerative joint disease is noted. No joint effusion is seen.  IMPRESSION: Negative.   Electronically Signed   By: Ivar Drape M.D.   On: 10/01/2016 14:58    Knee-L DG 1-2 views:  Results for orders placed during the hospital encounter of 10/01/16  DG Knee 1-2 Views Left   Narrative CLINICAL DATA:  Bilateral knee pain, no injury  EXAM: LEFT KNEE - 1-2 VIEW  COMPARISON:  None.  FINDINGS: The left knee joint spaces appear normal. No significant degenerative joint disease is seen. No joint effusion is noted.  IMPRESSION: Negative.   Electronically Signed   By: Ivar Drape M.D.   On: 10/01/2016 14:58    Note: Results of ordered imaging test(s) reviewed and explained to patient in Layman's terms. Copy of results provided to patient  Meds  The patient has a current medication list which includes the following prescription(s): famotidine-ca carb-mag hydrox, gabapentin, green tea, ibuprofen, losartan, multivitamin, simvastatin, tizanidine, and tramadol.  Current Outpatient Prescriptions on File Prior to Visit  Medication Sig  . Famotidine-Ca Carb-Mag Hydrox (PEPCID COMPLETE PO) Take by mouth as needed.  [name] Tea 315 MG CAPS Take by mouth daily.  Marland Kitchen ibuprofen (ADVIL,MOTRIN) 200 MG tablet Take 400 mg by mouth every 4 (four) hours as needed.   Marland Kitchen losartan (COZAAR) 50 MG tablet Take 1 tablet (50 mg total) by mouth daily.  . Multiple Vitamin (MULTIVITAMIN) tablet Take 1 tablet by mouth daily. With vit D  . simvastatin (ZOCOR) 10 MG tablet Take 1 tablet (10 mg total) by mouth daily.   No current facility-administered medications on file prior to visit.    ROS  Constitutional: Denies any fever or  chills Gastrointestinal: No reported hemesis, hematochezia, vomiting, or acute GI distress Musculoskeletal: Denies any acute onset joint swelling, redness, loss of ROM, or weakness Neurological: No reported episodes of acute onset apraxia, aphasia, dysarthria, agnosia, amnesia, paralysis, loss of coordination, or loss of consciousness  Allergies  Ms. Lagan has No Known Allergies.  [place]  Drug: Ms. Puccini  reports that she does not use drugs. Alcohol:  reports that she does not drink alcohol. Tobacco:  reports that she has never smoked. She has never used smokeless tobacco. Medical:  has a past medical history of Anemia; Arthritis; Cervical spine degeneration (2010); History of carpal tunnel syndrome; Hyperlipidemia; Hypertension; Sacroiliac joint pain (2010); and Scoliosis. Family: family history includes Alzheimer's disease in her maternal grandfather and paternal grandmother; Arthritis in her maternal aunt and mother; Colon cancer (  age of onset: 52) in her maternal grandfather; Diabetes in her maternal grandfather; Hypertension in her father, maternal grandmother, mother, and sister; Kidney cancer in her maternal grandmother; Liver cancer in her father.  Past Surgical History:  Procedure Laterality Date  . BREAST SURGERY  1986   reduction  . cervical facet block  2010  . HAND SURGERY Bilateral 2000 and 2016   benign tumor removal   . [place]   Constitutional Exam  General appearance: Well nourished, well developed, and well hydrated. In no apparent acute distress Vitals:   10/21/16 1011  BP: (!) 130/91  Resp: 16  Temp: 98.2 F (36.8 C)  TempSrc: Oral  SpO2: 100%  Weight: 144 lb (65.3 kg)  Height: 5' (1.524 m)   BMI Assessment: Estimated body mass index is 28.12 kg/m as calculated from the following:   Height as of this encounter: 5' (1.524 m).   Weight as of this encounter: 144 lb (65.3 kg).  BMI interpretation table: BMI level Category Range  association with higher incidence of chronic pain  <18 kg/m2 Underweight   18.5-24.9 kg/m2 Ideal body weight   25-29.9 kg/m2 Overweight Increased incidence by 20%  30-34.9 kg/m2 Obese (Class I) Increased incidence by 68%  35-39.9 kg/m2 Severe obesity (Class II) Increased incidence by 136%  >40 kg/m2 Extreme obesity (Class III) Increased incidence by 254%   BMI Readings from Last 4 Encounters:  10/21/16 28.12 kg/m  10/01/16 28.32 kg/m  09/03/16 29.54 kg/m  08/01/16 28.90 kg/m   Wt Readings from Last 4 Encounters:  10/21/16 144 lb (65.3 kg)  10/01/16 145 lb (65.8 kg)  09/03/16 151 lb 4 oz (68.6 kg)  08/01/16 148 lb (67.1 kg)  Psych/Mental status: Alert, oriented x 3 (person, place, & time)       Eyes: PERLA Respiratory: No evidence of acute respiratory distress  Cervical Spine Exam  Inspection: No masses, redness, or swelling Alignment: Symmetrical Functional ROM: Decreased ROM Stability: No instability detected Muscle strength & Tone: Functionally intact Sensory: Movement-associated pain Palpation: Complains of area being tender to palpation  Upper Extremity (UE) Exam    Side: Right upper extremity  Side: Left upper extremity  Inspection: No masses, redness, swelling, or asymmetry. No contractures  Inspection: No masses, redness, swelling, or asymmetry. No contractures  Functional ROM: Unrestricted ROM          Functional ROM: Unrestricted ROM          Muscle strength & Tone: Functionally intact  Muscle strength & Tone: Functionally intact  Sensory: Unimpaired  Sensory: Unimpaired  Palpation: No palpable anomalies  Palpation: No palpable anomalies  Specialized Test(s): Deferred         Specialized Test(s): Deferred          Thoracic Spine Exam  Inspection: No masses, redness, or swelling Alignment: Symmetrical Functional ROM: Unrestricted ROM Stability: No instability detected Sensory: Unimpaired Muscle strength & Tone: No palpable anomalies  Lumbar Spine Exam   Inspection: No masses, redness, or swelling Alignment: Symmetrical Functional ROM: Unrestricted ROM Stability: No instability detected Muscle strength & Tone: Functionally intact Sensory: Unimpaired Palpation: No palpable anomalies Provocative Tests: Lumbar Hyperextension and rotation test: evaluation deferred today       Patrick's Maneuver: evaluation deferred today              Gait & Posture Assessment  Ambulation: Unassisted Gait: Relatively normal for age and body habitus Posture: WNL   Lower Extremity Exam    Side: Right  lower extremity  Side: Left lower extremity  Inspection: No masses, redness, swelling, or asymmetry. No contractures  Inspection: No masses, redness, swelling, or asymmetry. No contractures  Functional ROM: Unrestricted ROM          Functional ROM: Unrestricted ROM          Muscle strength & Tone: Functionally intact  Muscle strength & Tone: Functionally intact  Sensory: Unimpaired  Sensory: Unimpaired  Palpation: No palpable anomalies  Palpation: No palpable anomalies   Assessment & Plan  Primary Diagnosis & Pertinent Problem List: The primary encounter diagnosis was Chronic neck pain (Location of Primary Source of Pain) (Bilateral) (L>R). Diagnoses of Chronic upper back pain (Location of Secondary source of pain) (Bilateral) (L>R), Chronic shoulder pain (Location of Tertiary source of pain) (Bilateral) (L>R), Chronic low back pain (Bilateral) (L>R), Cervical central spinal stenosis C5-6 (Right), Cervical spondylitis with radiculitis (HCC), Osteoarthritis of spine with radiculopathy, cervical region, Neurogenic pain, Myofascial pain, Chronic pain syndrome, Long term (current) use of opiate analgesic, Long term prescription opiate use, and Opiate use were also pertinent to this visit.  Visit Diagnosis: 1. Chronic neck pain (Location of Primary Source of Pain) (Bilateral) (L>R)   2. Chronic upper back pain (Location of Secondary source of pain) (Bilateral) (L>R)    3. Chronic shoulder pain (Location of Tertiary source of pain) (Bilateral) (L>R)   4. Chronic low back pain (Bilateral) (L>R)   5. Cervical central spinal stenosis C5-6 (Right)   6. Cervical spondylitis with radiculitis (HCC)   7. Osteoarthritis of spine with radiculopathy, cervical region   8. Neurogenic pain   9. Myofascial pain   10. Chronic pain syndrome   11. Long term (current) use of opiate analgesic   12. Long term prescription opiate use   13. Opiate use    Problems updated and reviewed during this visit: Problem  Long Term (Current) Use of Opiate Analgesic  Long Term Prescription Opiate Use  Opiate Use   Problem-specific Plan(s): No problem-specific Assessment & Plan notes found for this encounter.  Assessment & plan notes cannot be loaded without a specified hospital service.  Plan of Care  Pharmacotherapy (Medications Ordered): Meds ordered this encounter  Medications  . gabapentin (NEURONTIN) 100 MG capsule    Sig: Take 1-3 capsules (100-300 mg total) by mouth every 8 (eight) hours.    Dispense:  90 capsule    Refill:  0    Do not place this medication, or any other prescription from our practice, on "Automatic Refill". Patient may have prescription filled one day early if pharmacy is closed on scheduled refill date.  . tizanidine (ZANAFLEX) 2 MG capsule    Sig: Take 1 capsule (2 mg total) by mouth 3 (three) times daily as needed for muscle spasms.    Dispense:  90 capsule    Refill:  2    Do not place this medication, or any other prescription from our practice, on "Automatic Refill". Patient may have prescription filled one day early if pharmacy is closed on scheduled refill date.  . traMADol (ULTRAM) 50 MG tablet    Sig: Take 1 tablet (50 mg total) by mouth every 6 (six) hours as needed for severe pain.    Dispense:  120 tablet    Refill:  2    Do not place this medication, or any other prescription from our practice, on "Automatic Refill". Patient may have  prescription filled one day early if pharmacy is closed on scheduled refill date. Do not  fill until: 10/01/16 To last until: 10/31/16   Lab-work, procedure(s), and/or referral(s): Orders Placed This Encounter  Procedures  . Cervical Epidural Injection    Pharmacotherapy: Opioid Analgesics: We'll take over management today. See above orders Membrane stabilizer: We have discussed the possibility of optimizing this mode of therapy, if tolerated Muscle relaxant: We have discussed the possibility of a trial NSAID: We have discussed the possibility of a trial Other analgesic(s): To be determined at a later time   Interventional therapies: Planned, scheduled, and/or pending:    Diagnostic left cervical epidural steroid injection under fluoroscopic guidance and IV sedation    Considering:   Diagnostic left cervical epidural steroid injection under fluoroscopic guidance and IV sedation  Diagnostic bilateral cervical facet block under fluoroscopic guidance and IV sedation  Possible bilateral cervical facet RFA Diagnostic bilateral greater occipital nerve block Possible bilateral greater occipital RFA Diagnostic bilateral C2 + TON nerve block Possible bilateral C2 + TON RFA Possible bilateral occipital nerve stimulator trial Diagnostic bilateral lumbar facet block under fluoroscopic guidance and IV sedation  Possible bilateral lumbar facet RFA Diagnostic bilateral intra-articular knee joint injection with local anesthetic and steroid  Possible bilateral series of 5 intra-articular Hyalgan knee injections  Diagnostic bilateral Genicular nerve block  Possible bilateral Genicular nerve RFA Diagnostic costochondral/xiphoid local anesthetic and steroid injections    PRN Procedures:   To be determined at a later time   Provider-requested follow-up: Return in about 3 months (around 01/20/2017) for (MD) Med-Mgmt, in addition, procedure (ASAA).  Future Appointments Date Time Provider  [place]  10/27/2016 10:15 AM Milinda Pointer, MD [facility] None  11/13/2016 8:00 AM Ma Hillock, DO [facility] None  01/20/2017 10:30 AM Milinda Pointer, MD [facility] None    Primary Care Physician: Ma Hillock, DO Location: [facility] Note by: Kathlen Brunswick. Dossie Arbour, M.D, DABA, DABAPM, DABPM, DABIPP, FIPP Date: 10/21/2016; Time: 5:31 PM  Pain Score Disclaimer: We use the NRS-11 scale. This is a self-reported, subjective measurement of pain severity with only modest accuracy. It is used primarily to identify changes within a particular patient. It must be understood that outpatient pain scales are significantly less accurate that those used for research, where they can be applied under ideal controlled circumstances with minimal exposure to variables. In reality, the score is likely to be a combination of pain intensity and pain affect, where pain affect describes the degree of emotional arousal or changes in action readiness caused by the sensory experience of pain. Factors such as social and work situation, setting, emotional state, anxiety levels, expectation, and prior pain experience may influence pain perception and show large inter-individual differences that may also be affected by time variables.  Patient instructions provided during this appointment: Patient Instructions   Pain Score  Introduction: The pain score used by this practice is the Verbal Numerical Rating Scale (VNRS-11). This is an 11-point scale. It is for adults and children 10 years or older. There are significant differences in how the pain score is reported, used, and applied. Forget everything you learned in the past and learn this scoring system.  General Information: The scale should reflect your current level of pain. Unless you are specifically asked for the level of your worst pain, or your average pain. If you are asked for one of these two, then it should be understood  that it is over the past 24 hours.  Basic Activities of Daily Living (ADL): Personal hygiene, dressing, eating, transferring, and using restroom.  Instructions: Most patients tend to report their level of pain as a combination of two factors, their physical pain and their psychosocial pain. This last one is also known as "suffering" and it is reflection of how physical pain affects you socially and psychologically. From now on, report them separately. From this point on, when asked to report your pain level, report only your physical pain. Use the following table for reference.  Pain Clinic Pain Levels (0-5/10)  Pain Level Score Description  No Pain 0   Mild pain 1 Nagging, annoying, but does not interfere with basic activities of daily living (ADL). Patients are able to eat, bathe, get dressed, toileting (being able to get on and off the toilet and perform personal hygiene functions), transfer (move in and out of bed or a chair without assistance), and maintain continence (able to control bladder and bowel functions). Blood pressure and heart rate are unaffected. A normal heart rate for a healthy adult ranges from 60 to 100 bpm (beats per minute).   Mild to moderate pain 2 Noticeable and distracting. Impossible to hide from other people. More frequent flare-ups. Still possible to adapt and function close to normal. It can be very annoying and may have occasional stronger flare-ups. With discipline, patients may get used to it and adapt.   Moderate pain 3 Interferes significantly with activities of daily living (ADL). It becomes difficult to feed, bathe, get dressed, get on and off the toilet or to perform personal hygiene functions. Difficult to get in and out of bed or a chair without assistance. Very distracting. With effort, it can be ignored when deeply involved in activities.   Moderately severe pain 4 Impossible to ignore for more than a few minutes. With effort, patients may still be able to  manage work or participate in some social activities. Very difficult to concentrate. Signs of autonomic nervous system discharge are evident: dilated pupils (mydriasis); mild sweating (diaphoresis); sleep interference. Heart rate becomes elevated (>115 bpm). Diastolic blood pressure (lower number) rises above 100 mmHg. Patients find relief in laying down and not moving.   Severe pain 5 Intense and extremely unpleasant. Associated with frowning face and frequent crying. Pain overwhelms the senses.  Ability to do any activity or maintain social relationships becomes significantly limited. Conversation becomes difficult. Pacing back and forth is common, as getting into a comfortable position is nearly impossible. Pain wakes you up from deep sleep. Physical signs will be obvious: pupillary dilation; increased sweating; goosebumps; brisk reflexes; cold, clammy hands and feet; nausea, vomiting or dry heaves; loss of appetite; significant sleep disturbance with inability to fall asleep or to remain asleep. When persistent, significant weight loss is observed due to the complete loss of appetite and sleep deprivation.  Blood pressure and heart rate becomes significantly elevated. Caution: If elevated blood pressure triggers a pounding headache associated with blurred vision, then the patient should immediately seek attention at an urgent or emergency care unit, as these may be signs of an impending stroke.    Emergency Department Pain Levels (6-10/10)  Emergency Room Pain 6 Severely limiting. Requires emergency care and should not be seen or managed at an outpatient pain management facility. Communication becomes difficult and requires great effort. Assistance to reach the emergency department may be required. Facial flushing and profuse sweating along with potentially dangerous increases in heart rate and blood pressure will be evident.   Distressing pain 7 Self-care is very difficult. Assistance is required to  transport, or use restroom. Assistance  to reach the emergency department will be required. Tasks requiring coordination, such as bathing and getting dressed become very difficult.   Disabling pain 8 Self-care is no longer possible. At this level, pain is disabling. The individual is unable to do even the most "basic" activities such as walking, eating, bathing, dressing, transferring to a bed, or toileting. Fine motor skills are lost. It is difficult to think clearly.   Incapacitating pain 9 Pain becomes incapacitating. Thought processing is no longer possible. Difficult to remember your own name. Control of movement and coordination are lost.   The worst pain imaginable 10 At this level, most patients pass out from pain. When this level is reached, collapse of the autonomic nervous system occurs, leading to a sudden drop in blood pressure and heart rate. This in turn results in a temporary and dramatic drop in blood flow to the brain, leading to a loss of consciousness. Fainting is one of the body's self defense mechanisms. Passing out puts the brain in a calmed state and causes it to shut down for a while, in order to begin the healing process.    Summary: 1. Refer to this scale when providing [place] with your pain level. 2. Be accurate and careful when reporting your pain level. This will help with your care. 3. Over-reporting your pain level will lead to loss of credibility. 4. Even a level of 1/10 means that there is pain and will be treated at our facility. 5. High, inaccurate reporting will be documented as "Symptom Exaggeration", leading to loss of credibility and suspicions of possible secondary gains such as obtaining more narcotics, or wanting to appear disabled, for fraudulent reasons. 6. Only pain levels of 5 or below will be seen at our facility. 7. Pain levels of 6 and above will be sent to the Emergency Department and the appointment  cancelled. _____________________________________________________________________________________________  Interventional therapy and consideration: 1. Diagnostic left cervical epidural steroid injection under fluoroscopic guidance and IV sedation  2. Diagnostic bilateral cervical facet block under fluoroscopic guidance and IV sedation  3. Possible bilateral cervical facet RFA 4. Diagnostic bilateral greater occipital nerve block 5. Possible bilateral greater occipital RFA 6. Diagnostic bilateral C2 + TON nerve block 7. Possible bilateral C2 + TON RFA 8. Possible bilateral occipital nerve stimulator trial 9. Diagnostic bilateral lumbar facet block under fluoroscopic guidance and IV sedation  10. Possible bilateral lumbar facet RFA 11. Diagnostic bilateral intra-articular knee joint injection with local anesthetic and steroid  12. Possible bilateral series of 5 intra-articular Hyalgan knee injections  13. Diagnostic bilateral Genicular nerve block  14. Possible bilateral Genicular nerve RFA 15. Diagnostic costochondral/xiphoid local anesthetic and steroid injections  _____________________________________________________________________________________________   Epidural Steroid Injection Patient Information  Description: The epidural space surrounds the nerves as they exit the spinal cord.  In some patients, the nerves can be compressed and inflamed by a bulging disc or a tight spinal canal (spinal stenosis).  By injecting steroids into the epidural space, we can bring irritated nerves into direct contact with a potentially helpful medication.  These steroids act directly on the irritated nerves and can reduce swelling and inflammation which often leads to decreased pain.  Epidural steroids may be injected anywhere along the spine and from the neck to the low back depending upon the location of your pain.   After numbing the skin with local anesthetic (like Novocaine), a small needle is  passed into the epidural space slowly.  You may experience a sensation of pressure  while this is being done.  The entire block usually last less than 10 minutes.  Conditions which may be treated by epidural steroids:   Low back and leg pain  Neck and arm pain  Spinal stenosis  Post-laminectomy syndrome  Herpes zoster (shingles) pain  Pain from compression fractures  Preparation for the injection:  1. Do not eat any solid food or dairy products within 8 hours of your appointment.  2. You may drink clear liquids up to 3 hours before appointment.  Clear liquids include water, black coffee, juice or soda.  No milk or cream please. 3. You may take your regular medication, including pain medications, with a sip of water before your appointment  Diabetics should hold regular insulin (if taken separately) and take 1/2 normal NPH dos the morning of the procedure.  Carry some sugar containing items with you to your appointment. 4. A driver must accompany you and be prepared to drive you home after your procedure.  5. Bring all your current medications with your. 6. An IV may be inserted and sedation may be given at the discretion of the physician.   7. A blood pressure cuff, EKG and other monitors will often be applied during the procedure.  Some patients may need to have extra oxygen administered for a short period. 8. You will be asked to provide medical information, including your allergies, prior to the procedure.  We must know immediately if you are taking blood thinners (like Coumadin/Warfarin)  Or if you are allergic to IV iodine contrast (dye). We must know if you could possible be pregnant.  Possible side-effects:  Bleeding from needle site  Infection (rare, may require surgery)  Nerve injury (rare)  Numbness & tingling (temporary)  Difficulty urinating (rare, temporary)  Spinal headache ( a headache worse with upright posture)  Light -headedness (temporary)  Pain at  injection site (several days)  Decreased blood pressure (temporary)  Weakness in arm/leg (temporary)  Pressure sensation in back/neck (temporary)  Call if you experience:  Fever/chills associated with headache or increased back/neck pain.  Headache worsened by an upright position.  New onset weakness or numbness of an extremity below the injection site  Hives or difficulty breathing (go to the emergency room)  Inflammation or drainage at the infection site  Severe back/neck pain  Any new symptoms which are concerning to you  Please note:  Although the local anesthetic injected can often make your back or neck feel good for several hours after the injection, the pain will likely return.  It takes 3-7 days for steroids to work in the epidural space.  You may not notice any pain relief for at least that one week.  If effective, we will often do a series of three injections spaced 3-6 weeks apart to maximally decrease your pain.  After the initial series, we generally will wait several months before considering a repeat injection of the same type.  If you have any questions, please call [phone] [facility]

## 2016-10-21 NOTE — Progress Notes (Signed)
Safety precautions to be maintained throughout the outpatient stay will include: orient to surroundings, keep bed in low position, maintain call bell within reach at all times, provide assistance with transfer out of bed and ambulation.  

## 2016-10-21 NOTE — Patient Instructions (Addendum)
Pain Score  Introduction: The pain score used by this practice is the Verbal Numerical Rating Scale (VNRS-11). This is an 11-point scale. It is for adults and children 10 years or older. There are significant differences in how the pain score is reported, used, and applied. Forget everything you learned in the past and learn this scoring system.  General Information: The scale should reflect your current level of pain. Unless you are specifically asked for the level of your worst pain, or your average pain. If you are asked for one of these two, then it should be understood that it is over the past 24 hours.  Basic Activities of Daily Living (ADL): Personal hygiene, dressing, eating, transferring, and using restroom.  Instructions: Most patients tend to report their level of pain as a combination of two factors, their physical pain and their psychosocial pain. This last one is also known as "suffering" and it is reflection of how physical pain affects you socially and psychologically. From now on, report them separately. From this point on, when asked to report your pain level, report only your physical pain. Use the following table for reference.  Pain Clinic Pain Levels (0-5/10)  Pain Level Score Description  No Pain 0   Mild pain 1 Nagging, annoying, but does not interfere with basic activities of daily living (ADL). Patients are able to eat, bathe, get dressed, toileting (being able to get on and off the toilet and perform personal hygiene functions), transfer (move in and out of bed or a chair without assistance), and maintain continence (able to control bladder and bowel functions). Blood pressure and heart rate are unaffected. A normal heart rate for a healthy adult ranges from 60 to 100 bpm (beats per minute).   Mild to moderate pain 2 Noticeable and distracting. Impossible to hide from other people. More frequent flare-ups. Still possible to adapt and function close to normal. It can be very  annoying and may have occasional stronger flare-ups. With discipline, patients may get used to it and adapt.   Moderate pain 3 Interferes significantly with activities of daily living (ADL). It becomes difficult to feed, bathe, get dressed, get on and off the toilet or to perform personal hygiene functions. Difficult to get in and out of bed or a chair without assistance. Very distracting. With effort, it can be ignored when deeply involved in activities.   Moderately severe pain 4 Impossible to ignore for more than a few minutes. With effort, patients may still be able to manage work or participate in some social activities. Very difficult to concentrate. Signs of autonomic nervous system discharge are evident: dilated pupils (mydriasis); mild sweating (diaphoresis); sleep interference. Heart rate becomes elevated (>115 bpm). Diastolic blood pressure (lower number) rises above 100 mmHg. Patients find relief in laying down and not moving.   Severe pain 5 Intense and extremely unpleasant. Associated with frowning face and frequent crying. Pain overwhelms the senses.  Ability to do any activity or maintain social relationships becomes significantly limited. Conversation becomes difficult. Pacing back and forth is common, as getting into a comfortable position is nearly impossible. Pain wakes you up from deep sleep. Physical signs will be obvious: pupillary dilation; increased sweating; goosebumps; brisk reflexes; cold, clammy hands and feet; nausea, vomiting or dry heaves; loss of appetite; significant sleep disturbance with inability to fall asleep or to remain asleep. When persistent, significant weight loss is observed due to the complete loss of appetite and sleep deprivation.  Blood pressure and heart   rate becomes significantly elevated. Caution: If elevated blood pressure triggers a pounding headache associated with blurred vision, then the patient should immediately seek attention at an urgent or  emergency care unit, as these may be signs of an impending stroke.    Emergency Department Pain Levels (6-10/10)  Emergency Room Pain 6 Severely limiting. Requires emergency care and should not be seen or managed at an outpatient pain management facility. Communication becomes difficult and requires great effort. Assistance to reach the emergency department may be required. Facial flushing and profuse sweating along with potentially dangerous increases in heart rate and blood pressure will be evident.   Distressing pain 7 Self-care is very difficult. Assistance is required to transport, or use restroom. Assistance to reach the emergency department will be required. Tasks requiring coordination, such as bathing and getting dressed become very difficult.   Disabling pain 8 Self-care is no longer possible. At this level, pain is disabling. The individual is unable to do even the most "basic" activities such as walking, eating, bathing, dressing, transferring to a bed, or toileting. Fine motor skills are lost. It is difficult to think clearly.   Incapacitating pain 9 Pain becomes incapacitating. Thought processing is no longer possible. Difficult to remember your own name. Control of movement and coordination are lost.   The worst pain imaginable 10 At this level, most patients pass out from pain. When this level is reached, collapse of the autonomic nervous system occurs, leading to a sudden drop in blood pressure and heart rate. This in turn results in a temporary and dramatic drop in blood flow to the brain, leading to a loss of consciousness. Fainting is one of the body's self defense mechanisms. Passing out puts the brain in a calmed state and causes it to shut down for a while, in order to begin the healing process.    Summary: 1. Refer to this scale when providing [place] with your pain level. 2. Be accurate and careful when reporting your pain level. This will help with your care. 3. Over-reporting  your pain level will lead to loss of credibility. 4. Even a level of 1/10 means that there is pain and will be treated at our facility. 5. High, inaccurate reporting will be documented as "Symptom Exaggeration", leading to loss of credibility and suspicions of possible secondary gains such as obtaining more narcotics, or wanting to appear disabled, for fraudulent reasons. 6. Only pain levels of 5 or below will be seen at our facility. 7. Pain levels of 6 and above will be sent to the Emergency Department and the appointment cancelled. _____________________________________________________________________________________________  Interventional therapy and consideration: 1. Diagnostic left cervical epidural steroid injection under fluoroscopic guidance and IV sedation  2. Diagnostic bilateral cervical facet block under fluoroscopic guidance and IV sedation  3. Possible bilateral cervical facet RFA 4. Diagnostic bilateral greater occipital nerve block 5. Possible bilateral greater occipital RFA 6. Diagnostic bilateral C2 + TON nerve block 7. Possible bilateral C2 + TON RFA 8. Possible bilateral occipital nerve stimulator trial 9. Diagnostic bilateral lumbar facet block under fluoroscopic guidance and IV sedation  10. Possible bilateral lumbar facet RFA 11. Diagnostic bilateral intra-articular knee joint injection with local anesthetic and steroid  12. Possible bilateral series of 5 intra-articular Hyalgan knee injections  13. Diagnostic bilateral Genicular nerve block  14. Possible bilateral Genicular nerve RFA 15. Diagnostic costochondral/xiphoid local anesthetic and steroid injections  _____________________________________________________________________________________________   Epidural Steroid Injection Patient Information  Description: The epidural space surrounds the nerves  as they exit the spinal cord.  In some patients, the nerves can be compressed and inflamed by a bulging disc or  a tight spinal canal (spinal stenosis).  By injecting steroids into the epidural space, we can bring irritated nerves into direct contact with a potentially helpful medication.  These steroids act directly on the irritated nerves and can reduce swelling and inflammation which often leads to decreased pain.  Epidural steroids may be injected anywhere along the spine and from the neck to the low back depending upon the location of your pain.   After numbing the skin with local anesthetic (like Novocaine), a small needle is passed into the epidural space slowly.  You may experience a sensation of pressure while this is being done.  The entire block usually last less than 10 minutes.  Conditions which may be treated by epidural steroids:   Low back and leg pain  Neck and arm pain  Spinal stenosis  Post-laminectomy syndrome  Herpes zoster (shingles) pain  Pain from compression fractures  Preparation for the injection:  1. Do not eat any solid food or dairy products within 8 hours of your appointment.  2. You may drink clear liquids up to 3 hours before appointment.  Clear liquids include water, black coffee, juice or soda.  No milk or cream please. 3. You may take your regular medication, including pain medications, with a sip of water before your appointment  Diabetics should hold regular insulin (if taken separately) and take 1/2 normal NPH dos the morning of the procedure.  Carry some sugar containing items with you to your appointment. 4. A driver must accompany you and be prepared to drive you home after your procedure.  5. Bring all your current medications with your. 6. An IV may be inserted and sedation may be given at the discretion of the physician.   7. A blood pressure cuff, EKG and other monitors will often be applied during the procedure.  Some patients may need to have extra oxygen administered for a short period. 8. You will be asked to provide medical information, including  your allergies, prior to the procedure.  We must know immediately if you are taking blood thinners (like Coumadin/Warfarin)  Or if you are allergic to IV iodine contrast (dye). We must know if you could possible be pregnant.  Possible side-effects:  Bleeding from needle site  Infection (rare, may require surgery)  Nerve injury (rare)  Numbness & tingling (temporary)  Difficulty urinating (rare, temporary)  Spinal headache ( a headache worse with upright posture)  Light -headedness (temporary)  Pain at injection site (several days)  Decreased blood pressure (temporary)  Weakness in arm/leg (temporary)  Pressure sensation in back/neck (temporary)  Call if you experience:  Fever/chills associated with headache or increased back/neck pain.  Headache worsened by an upright position.  New onset weakness or numbness of an extremity below the injection site  Hives or difficulty breathing (go to the emergency room)  Inflammation or drainage at the infection site  Severe back/neck pain  Any new symptoms which are concerning to you  Please note:  Although the local anesthetic injected can often make your back or neck feel good for several hours after the injection, the pain will likely return.  It takes 3-7 days for steroids to work in the epidural space.  You may not notice any pain relief for at least that one week.  If effective, we will often do a series of three injections spaced  3-6 weeks apart to maximally decrease your pain.  After the initial series, we generally will wait several months before considering a repeat injection of the same type.  If you have any questions, please call [phone] [facility]

## 2016-10-22 ENCOUNTER — Ambulatory Visit: Payer: BLUE CROSS/BLUE SHIELD | Admitting: Pain Medicine

## 2016-10-27 ENCOUNTER — Ambulatory Visit: Payer: BLUE CROSS/BLUE SHIELD | Admitting: Pain Medicine

## 2016-11-03 ENCOUNTER — Ambulatory Visit: Payer: BLUE CROSS/BLUE SHIELD | Admitting: Pain Medicine

## 2016-11-05 ENCOUNTER — Ambulatory Visit (HOSPITAL_BASED_OUTPATIENT_CLINIC_OR_DEPARTMENT_OTHER): Payer: BLUE CROSS/BLUE SHIELD | Admitting: Pain Medicine

## 2016-11-05 ENCOUNTER — Ambulatory Visit
Admission: RE | Admit: 2016-11-05 | Discharge: 2016-11-05 | Disposition: A | Discharge status: Home / Self Care | Payer: BLUE CROSS/BLUE SHIELD | Source: Ambulatory Visit | Attending: Pain Medicine | Admitting: Pain Medicine

## 2016-11-05 ENCOUNTER — Encounter: Payer: Self-pay | Admitting: Pain Medicine

## 2016-11-05 VITALS — BP 112/65 | HR 62 | Temp 97.9°F | Resp 14 | Ht 60.0 in | Wt 146.0 lb

## 2016-11-05 DIAGNOSIS — R51 Headache: Secondary | ICD-10-CM | POA: Diagnosis not present

## 2016-11-05 DIAGNOSIS — M5412 Radiculopathy, cervical region: Secondary | ICD-10-CM | POA: Diagnosis not present

## 2016-11-05 DIAGNOSIS — Z79899 Other long term (current) drug therapy: Secondary | ICD-10-CM | POA: Insufficient documentation

## 2016-11-05 DIAGNOSIS — M4722 Other spondylosis with radiculopathy, cervical region: Secondary | ICD-10-CM

## 2016-11-05 DIAGNOSIS — M5030 Other cervical disc degeneration, unspecified cervical region: Secondary | ICD-10-CM

## 2016-11-05 DIAGNOSIS — M4692 Unspecified inflammatory spondylopathy, cervical region: Secondary | ICD-10-CM

## 2016-11-05 DIAGNOSIS — M4682 Other specified inflammatory spondylopathies, cervical region: Secondary | ICD-10-CM | POA: Diagnosis not present

## 2016-11-05 DIAGNOSIS — G8929 Other chronic pain: Secondary | ICD-10-CM | POA: Diagnosis not present

## 2016-11-05 DIAGNOSIS — M4802 Spinal stenosis, cervical region: Secondary | ICD-10-CM | POA: Insufficient documentation

## 2016-11-05 DIAGNOSIS — M542 Cervicalgia: Secondary | ICD-10-CM

## 2016-11-05 DIAGNOSIS — G4486 Cervicogenic headache: Secondary | ICD-10-CM

## 2016-11-05 MED ORDER — DEXAMETHASONE SODIUM PHOSPHATE 10 MG/ML IJ SOLN
10.0000 mg | Freq: Once | INTRAMUSCULAR | Status: AC
  Administered 2016-11-05: 10 mg
  Filled 2016-11-05: qty 1

## 2016-11-05 MED ORDER — SODIUM CHLORIDE 0.9% FLUSH
1.0000 mL | Freq: Once | INTRAVENOUS | Status: AC
Start: 2016-11-05 — End: 2016-11-05
  Administered 2016-11-05: 1 mL

## 2016-11-05 MED ORDER — FENTANYL CITRATE (PF) 100 MCG/2ML IJ SOLN
25.0000 ug | INTRAMUSCULAR | Status: DC | PRN
  Administered 2016-11-05: 50 ug via INTRAVENOUS
  Filled 2016-11-05: qty 2

## 2016-11-05 MED ORDER — IOPAMIDOL (ISOVUE-M 200) INJECTION 41%
10.0000 mL | Freq: Once | INTRAMUSCULAR | Status: AC
  Administered 2016-11-05: 10 mL via EPIDURAL

## 2016-11-05 MED ORDER — ROPIVACAINE HCL 2 MG/ML IJ SOLN
1.0000 mL | Freq: Once | INTRAMUSCULAR | Status: AC
  Administered 2016-11-05: 10 mL via EPIDURAL
  Filled 2016-11-05: qty 10

## 2016-11-05 MED ORDER — LACTATED RINGERS IV SOLN
1000.0000 mL | Freq: Once | INTRAVENOUS | Status: AC
  Administered 2016-11-05: 1000 mL via INTRAVENOUS

## 2016-11-05 MED ORDER — MIDAZOLAM HCL 5 MG/5ML IJ SOLN
1.0000 mg | INTRAMUSCULAR | Status: DC | PRN
  Administered 2016-11-05: 2 mg via INTRAVENOUS
  Filled 2016-11-05: qty 5

## 2016-11-05 MED ORDER — LIDOCAINE HCL (PF) 1 % IJ SOLN
10.0000 mL | Freq: Once | INTRAMUSCULAR | Status: AC
  Administered 2016-11-05: 10 mL
  Filled 2016-11-05: qty 10

## 2016-11-05 NOTE — Progress Notes (Signed)
Patient's Name: Sarah Fisher  MRN: [id number]  Referring Provider: Ma Hillock, DO  DOB: 1960/12/14  PCP: Ma Hillock, DO  DOS: 11/05/2016  Note by: Kathlen Brunswick. Dossie Arbour, MD  Service setting: Ambulatory outpatient  Location: [facility] (AMB) Pain Management Facility  Visit type: Procedure  Specialty: Interventional Pain Management  Patient type: Established   Primary Reason for Visit: Interventional Pain Management Treatment. CC: Neck Pain (left) and Shoulder Pain (left)  Procedure:  Anesthesia, Analgesia, Anxiolysis:  Type: Therapeutic, Inter-Laminar, Epidural Steroid Injection Region: Posterior Cervico-thoracic Region Level: C7-T1 Laterality: Left-Sided Paramedial  Type: Local Anesthesia with Moderate (Conscious) Sedation Local Anesthetic: Lidocaine 1% Route: Intravenous (IV) IV Access: Secured Sedation: Meaningful verbal contact was maintained at all times during the procedure  Indication(s): Analgesia and Anxiety  Indications: 1. Cervical central spinal stenosis C5-6 (Right)   2. Chronic cervical radiculopathy (Bilateral) (L>R)   3. Cervicogenic headache (Bilateral) (R>L)   4. Cervical spondylosis   5. Cervical DDD (degenerative disc disease) (C5-6)   6. Chronic neck pain (Location of Primary Source of Pain) (Bilateral) (L>R)    Pain Score: Pre-procedure: 3 /10 Post-procedure: 0-No pain/10  Pre-op Assessment:  Previous date of service: 10/21/16 Service provided: Evaluation Sarah Fisher is a 56 y.o. (year old), female patient, seen today for interventional treatment. She  has a past surgical history that includes Breast surgery (1986); Hand surgery (Bilateral, 2000 and 2016); cervical facet block (2010); and Wisdom tooth extraction (1979). Her primarily concern today is the Neck Pain (left) and Shoulder Pain (left)  Initial Vital Signs: Blood pressure 129/89, pulse 75, temperature 97.9 F (36.6 C), height 5' (1.524 m), weight 146 lb (66.2 kg), SpO2 98 %. BMI: 28.51  kg/m  Risk Assessment: Allergies: Reviewed. She has No Known Allergies.  Allergy Precautions: None required Coagulopathies: "Reviewed. None identified.  Blood-thinner therapy: None at this time Active Infection(s): Reviewed. None identified. Sarah Fisher is afebrile  Site Confirmation: [name] was asked to confirm the procedure and laterality before marking the site Procedure checklist: Completed Consent: Before the procedure and under the influence of no sedative(s), amnesic(s), or anxiolytics, the patient was informed of the treatment options, risks and possible complications. To fulfill our ethical and legal obligations, as recommended by the American Medical Association's Code of Ethics, I have informed the patient of my clinical impression; the nature and purpose of the treatment or procedure; the risks, benefits, and possible complications of the intervention; the alternatives, including doing nothing; the risk(s) and benefit(s) of the alternative treatment(s) or procedure(s); and the risk(s) and benefit(s) of doing nothing. The patient was provided information about the general risks and possible complications associated with the procedure. These may include, but are not limited to: failure to achieve desired goals, infection, bleeding, organ or nerve damage, allergic reactions, paralysis, and death. In addition, the patient was informed of those risks and complications associated to Spine-related procedures, such as failure to decrease pain; infection (i.e.: Meningitis, epidural or intraspinal abscess); bleeding (i.e.: epidural hematoma, subarachnoid hemorrhage, or any other type of intraspinal or peri-dural bleeding); organ or nerve damage (i.e.: Any type of peripheral nerve, nerve root, or spinal cord injury) with subsequent damage to sensory, motor, and/or autonomic systems, resulting in permanent pain, numbness, and/or weakness of one or several areas of the body; allergic reactions;  (i.e.: anaphylactic reaction); and/or death. Furthermore, the patient was informed of those risks and complications associated with the medications. These include, but are not limited to: allergic reactions (i.e.: anaphylactic or anaphylactoid  reaction(s)); adrenal axis suppression; blood sugar elevation that in diabetics may result in ketoacidosis or comma; water retention that in patients with history of congestive heart failure may result in shortness of breath, pulmonary edema, and decompensation with resultant heart failure; weight gain; swelling or edema; medication-induced neural toxicity; particulate matter embolism and blood vessel occlusion with resultant organ, and/or nervous system infarction; and/or aseptic necrosis of one or more joints. Finally, the patient was informed that Medicine is not an exact science; therefore, there is also the possibility of unforeseen or unpredictable risks and/or possible complications that may result in a catastrophic outcome. The patient indicated having understood very clearly. We have given the patient no guarantees and we have made no promises. Enough time was given to the patient to ask questions, all of which were answered to the patient's satisfaction. Sarah Fisher has indicated that she wanted to continue with the procedure. Attestation: I, the ordering provider, attest that I have discussed with the patient the benefits, risks, side-effects, alternatives, likelihood of achieving goals, and potential problems during recovery for the procedure that I have provided informed consent. Date: 11/05/2016; Time: 10:25 AM  Pre-Procedure Preparation:  Monitoring: As per clinic protocol. Respiration, ETCO2, SpO2, BP, heart rate and rhythm monitor placed and checked for adequate function Safety Precautions: Patient was assessed for positional comfort and pressure points before starting the procedure. Time-out: I initiated and conducted the "Time-out" before starting the  procedure, as per protocol. The patient was asked to participate by confirming the accuracy of the "Time Out" information. Verification of the correct person, site, and procedure were performed and confirmed by me, the nursing staff, and the patient. "Time-out" conducted as per Joint Commission's Universal Protocol (UP.01.01.01). "Time-out" Date & Time: 11/05/2016; 1045 hrs.  Description of Procedure Process:   Position: Prone with head of the table was raised to facilitate breathing. Target Area: For Epidural Steroid injections the target is the interlaminar space, initially targeting the lower border of the superior vertebral body lamina. Approach: Paramedial approach. Area Prepped: Entire PosteriorCervical Region Prepping solution: ChloraPrep (2% chlorhexidine gluconate and 70% isopropyl alcohol) Safety Precautions: Aspiration looking for blood return was conducted prior to all injections. At no point did we inject any substances, as a needle was being advanced. No attempts were made at seeking any paresthesias. Safe injection practices and needle disposal techniques used. Medications properly checked for expiration dates. SDV (single dose vial) medications used. Description of the Procedure: Protocol guidelines were followed. The procedure needle was introduced through the skin, ipsilateral to the reported pain, and advanced to the target area. Bone was contacted and the needle walked caudad, until the lamina was cleared. The epidural space was identified using "loss-of-resistance technique" with 2-3 ml of PF-NaCl (0.9% NSS), in a 5cc LOR glass syringe. Vitals:   11/05/16 1056 11/05/16 1106 11/05/16 1115 11/05/16 1125  BP: 113/84 102/66 105/65 112/65  Pulse:  62 63 62  Resp: 18 16 16 14   Temp:      SpO2: 97% 97% 96% 97%  Weight:      Height:        Start Time: 1045 hrs. End Time: 1055 hrs. Materials:  Needle(s) Type: Epidural needle Gauge: 17G Length: 3.5-in Medication(s): We  administered dexamethasone, iopamidol, lidocaine (PF), sodium chloride flush, ropivacaine (PF) 2 mg/mL (0.2%), lactated ringers, midazolam, and fentaNYL. Please see chart orders for dosing details.  Imaging Guidance (Spinal):  Type of Imaging Technique: Fluoroscopy Guidance (Spinal) Indication(s): Assistance in needle guidance and placement for procedures  requiring needle placement in or near specific anatomical locations not easily accessible without such assistance. Exposure Time: Please see nurses notes. Contrast: Before injecting any contrast, we confirmed that the patient did not have an allergy to iodine, shellfish, or radiological contrast. Once satisfactory needle placement was completed at the desired level, radiological contrast was injected. Contrast injected under live fluoroscopy. No contrast complications. See chart for type and volume of contrast used. Fluoroscopic Guidance: I was personally present during the use of fluoroscopy. "Tunnel Vision Technique" used to obtain the best possible view of the target area. Parallax error corrected before commencing the procedure. "Direction-depth-direction" technique used to introduce the needle under continuous pulsed fluoroscopy. Once target was reached, antero-posterior, oblique, and lateral fluoroscopic projection used confirm needle placement in all planes. Images permanently stored in EMR. Interpretation: I personally interpreted the imaging intraoperatively. Adequate needle placement confirmed in multiple planes. Appropriate spread of contrast into desired area was observed. No evidence of afferent or efferent intravascular uptake. No intrathecal or subarachnoid spread observed. Permanent images saved into the patient's record.  Antibiotic Prophylaxis:  Indication(s): None identified Antibiotic given: None  Post-operative Assessment:  EBL: None Complications: No immediate post-treatment complications observed by team, or reported by  patient. Note: The patient tolerated the entire procedure well. A repeat set of vitals were taken after the procedure and the patient was kept under observation following institutional policy, for this type of procedure. Post-procedural neurological assessment was performed, showing return to baseline, prior to discharge. The patient was provided with post-procedure discharge instructions, including a section on how to identify potential problems. Should any problems arise concerning this procedure, the patient was given instructions to immediately contact us, at any time, without hesitation. In any case, we plan to contact the patient by telephone for a follow-up status report regarding this interventional procedure. Comments:  No additional relevant information.  Plan of Care  Disposition: Discharge home  Discharge Date & Time: 11/05/2016; 1130 hrs.  Physician-requested Follow-up:  Return for post-procedure eval (in 2 wks), (2 wks), w/ MD.  Future Appointments Date Time Provider [place]  11/14/2016 11:15 AM Ma Hillock, DO [facility] None  12/08/2016 1:30 PM Milinda Pointer, MD [facility] None  01/20/2017 10:30 AM Milinda Pointer, MD [facility] None   Medications ordered for procedure: Meds ordered this encounter  Medications  . dexamethasone (DECADRON) injection 10 mg  . iopamidol (ISOVUE-M) 41 % intrathecal injection 10 mL  . lidocaine (PF) (XYLOCAINE) 1 % injection 10 mL  . sodium chloride flush (NS) 0.9 % injection 1 mL  . ropivacaine (PF) 2 mg/mL (0.2%) (NAROPIN) injection 1 mL  . lactated ringers infusion 1,000 mL  . midazolam (VERSED) 5 MG/5ML injection 1-2 mg    Make sure Flumazenil is available in the pyxis when using this medication. If oversedation occurs, administer 0.2 mg IV over 15 sec. If after 45 sec no response, administer 0.2 mg again over 1 min; may repeat at 1 min intervals; not to exceed 4 doses (1 mg)  . fentaNYL (SUBLIMAZE) injection 25-50 mcg    Make sure  Narcan is available in the pyxis when using this medication. In the event of respiratory depression (RR< 8/min): Titrate NARCAN (naloxone) in increments of 0.1 to 0.2 mg IV at 2-3 minute intervals, until desired degree of reversal.   Medications administered: We administered dexamethasone, iopamidol, lidocaine (PF), sodium chloride flush, ropivacaine (PF) 2 mg/mL (0.2%), lactated ringers, midazolam, and fentaNYL.  See the medical record for exact dosing, route, and time  of administration.  Lab-work, Procedure(s), & Referral(s) Ordered: Orders Placed This Encounter  Procedures  . Cervical Epidural Injection  . DG C-Arm 1-60 Min-No Report  . Informed Consent Details: Transcribe to consent form and obtain patient signature  . Provider attestation of informed consent for procedure/surgical case  . Verify informed consent  . Discharge instructions  . Follow-up   Imaging Ordered: No results found for this or any previous visit. New Prescriptions   No medications on file   Primary Care Physician: Ma Hillock, DO Location: [facility] Note by: Kathlen Brunswick. Dossie Arbour, M.D, DABA, DABAPM, DABPM, DABIPP, FIPP Date: 11/05/2016; Time: 11:01 AM  Disclaimer:  Medicine is not an Chief Strategy Officer. The only guarantee in medicine is that nothing is guaranteed. It is important to note that the decision to proceed with this intervention was based on the information collected from the patient. The Data and conclusions were drawn from the patient's questionnaire, the interview, and the physical examination. Because the information was provided in large part by the patient, it cannot be guaranteed that it has not been purposely or unconsciously manipulated. Every effort has been made to obtain as much relevant data as possible for this evaluation. It is important to note that the conclusions that lead to this procedure are derived in large part from the available data. Always take into  account that the treatment will also be dependent on availability of resources and existing treatment guidelines, considered by other Pain Management Practitioners as being common knowledge and practice, at the time of the intervention. For Medico-Legal purposes, it is also important to point out that variation in procedural techniques and pharmacological choices are the acceptable norm. The indications, contraindications, technique, and results of the above procedure should only be interpreted and judged by a Board-Certified Interventional Pain Specialist with extensive familiarity and expertise in the same exact procedure and technique.  Instructions provided at this appointment: Patient Instructions  Post-Procedure instructions Instructions:  Apply ice: Fill a plastic sandwich bag with crushed ice. Cover it with a small towel and apply to injection site. Apply for 15 minutes then remove x 15 minutes. Repeat sequence on day of procedure, until you go to bed. The purpose is to minimize swelling and discomfort after procedure.  Apply heat: Apply heat to procedure site starting the day following the procedure. The purpose is to treat any soreness and discomfort from the procedure.  Food intake: Start with clear liquids (like water) and advance to regular food, as tolerated.   Physical activities: Keep activities to a minimum for the first 8 hours after the procedure.   Driving: If you have received any sedation, you are not allowed to drive for 24 hours after your procedure.  Blood thinner: Restart your blood thinner 6 hours after your procedure. (Only for those taking blood thinners)  Insulin: As soon as you can eat, you may resume your normal dosing schedule. (Only for those taking insulin)  Infection prevention: Keep procedure site clean and dry.  Post-procedure Pain Diary: Extremely important that this be done correctly and accurately. Recorded information will be used to determine the next  step in treatment.  Pain evaluated is that of treated area only. Do not include pain from an untreated area.  Complete every hour, on the hour, for the initial 8 hours. Set an alarm to help you do this part accurately.  Do not go to sleep and have it completed later. It will not be accurate.  Follow-up  appointment: Keep your follow-up appointment after the procedure. Usually 2 weeks for most procedures. (6 weeks in the case of radiofrequency.) Bring you pain diary.  Expect:  From numbing medicine (AKA: Local Anesthetics): Numbness or decrease in pain.  Onset: Full effect within 15 minutes of injected.  Duration: It will depend on the type of local anesthetic used. On the average, 1 to 8 hours.   From steroids: Decrease in swelling or inflammation. Once inflammation is improved, relief of the pain will follow.  Onset of benefits: Depends on the amount of swelling present. The more swelling, the longer it will take for the benefits to be seen.   Duration: Steroids will stay in the system x 2 weeks. Duration of benefits will depend on multiple posibilities including persistent irritating factors.  From procedure: Some discomfort is to be expected once the numbing medicine wears off. This should be minimal if ice and heat are applied as instructed. Call if:  You experience numbness and weakness that gets worse with time, as opposed to wearing off.  New onset bowel or bladder incontinence. (Spinal procedures only)  Emergency Numbers:  Durning business hours (Monday - Thursday, 8:00 AM - 4:00 PM) (Friday, 9:00 AM - 12:00 Noon): [phone]  After hours: [phone] _____________________________________________________________________________________________  Pain Management Discharge Instructions  General Discharge Instructions :  If you need to reach your doctor call: Monday-Friday 8:00 am - 4:00 pm at [phone] or toll free [phone].  After clinic hours  [phone] to have operator reach doctor.  Bring all of your medication bottles to all your appointments in the pain clinic.  To cancel or reschedule your appointment with Pain Management please remember to call 24 hours in advance to avoid a fee.  Refer to the educational materials which you have been given on: General Risks, I had my Procedure. Discharge Instructions, Post Sedation.  Post Procedure Instructions:  The drugs you were given will stay in your system until tomorrow, so for the next 24 hours you should not drive, make any legal decisions or drink any alcoholic beverages.  You may eat anything you prefer, but it is better to start with liquids then soups and crackers, and gradually work up to solid foods.  Please notify your doctor immediately if you have any unusual bleeding, trouble breathing or pain that is not related to your normal pain.  Depending on the type of procedure that was done, some parts of your body may feel week and/or numb.  This usually clears up by tonight or the next day.  Walk with the use of an assistive device or accompanied by an adult for the 24 hours.  You may use ice on the affected area for the first 24 hours.  Put ice in a Ziploc bag and cover with a towel and place against area 15 minutes on 15 minutes off.  You may switch to heat after 24 hours. Epidural Steroid Injection An epidural steroid injection is a shot of steroid medicine and numbing medicine that is given into the space between the spinal cord and the bones in your back (epidural space). The shot helps relieve pain caused by an irritated or swollen nerve root. The amount of pain relief you get from the injection depends on what is causing the nerve to be swollen and irritated, and how long your pain lasts. You are more likely to benefit from this injection if your pain is strong and comes on suddenly rather than if you have had pain  for a long time. Tell a health care provider  about:  Any allergies you have.  All medicines you are taking, including vitamins, herbs, eye drops, creams, and over-the-counter medicines.  Any problems you or family members have had with anesthetic medicines.  Any blood disorders you have.  Any surgeries you have had.  Any medical conditions you have.  Whether you are pregnant or may be pregnant. What are the risks? Generally, this is a safe procedure. However, problems may occur, including:  Headache.  Bleeding.  Infection.  Allergic reaction to medicines.  Damage to your nerves. What happens before the procedure? Staying hydrated  Follow instructions from your health care provider about hydration, which may include:  Up to 2 hours before the procedure - you may continue to drink clear liquids, such as water, clear fruit juice, black coffee, and plain tea. Eating and drinking restrictions  Follow instructions from your health care provider about eating and drinking, which may include:  8 hours before the procedure - stop eating heavy meals or foods such as meat, fried foods, or fatty foods.  6 hours before the procedure - stop eating light meals or foods, such as toast or cereal.  6 hours before the procedure - stop drinking milk or drinks that contain milk.  2 hours before the procedure - stop drinking clear liquids. Medicine   You may be given medicines to lower anxiety.  Ask your health care provider about:  Changing or stopping your regular medicines. This is especially important if you are taking diabetes medicines or blood thinners.  Taking medicines such as aspirin and ibuprofen. These medicines can thin your blood. Do not take these medicines before your procedure if your health care provider instructs you not to. General instructions   Plan to have someone take you home from the hospital or clinic. What happens during the procedure?  You may receive a medicine to help you relax (sedative).  You  will be asked to lie on your abdomen.  The injection site will be cleaned.  A numbing medicine (local anesthetic) will be used to numb the injection site.  A needle will be inserted through your skin into the epidural space. You may feel some discomfort when this happens. An X-ray machine will be used to make sure the needle is put as close as possible to the affected nerve.  A steroid medicine and a local anesthetic will be injected into the epidural space.  The needle will be removed.  A bandage (dressing) will be put over the injection site. What happens after the procedure?  Your blood pressure, heart rate, breathing rate, and blood oxygen level will be monitored until the medicines you were given have worn off.  Your arm or leg may feel weak or numb for a few hours.  The injection site may feel sore.  Do not drive for 24 hours if you received a sedative. This information is not intended to replace advice given to you by your health care provider. Make sure you discuss any questions you have with your health care provider. Document Released: 09/30/2007 Document Revised: 12/05/2015 Document Reviewed: 10/09/2015 Elsevier Interactive Patient Education  2017 [employer].

## 2016-11-05 NOTE — Patient Instructions (Addendum)
Post-Procedure instructions Instructions:  Apply ice: Fill a plastic sandwich bag with crushed ice. Cover it with a small towel and apply to injection site. Apply for 15 minutes then remove x 15 minutes. Repeat sequence on day of procedure, until you go to bed. The purpose is to minimize swelling and discomfort after procedure.  Apply heat: Apply heat to procedure site starting the day following the procedure. The purpose is to treat any soreness and discomfort from the procedure.  Food intake: Start with clear liquids (like water) and advance to regular food, as tolerated.   Physical activities: Keep activities to a minimum for the first 8 hours after the procedure.   Driving: If you have received any sedation, you are not allowed to drive for 24 hours after your procedure.  Blood thinner: Restart your blood thinner 6 hours after your procedure. (Only for those taking blood thinners)  Insulin: As soon as you can eat, you may resume your normal dosing schedule. (Only for those taking insulin)  Infection prevention: Keep procedure site clean and dry.  Post-procedure Pain Diary: Extremely important that this be done correctly and accurately. Recorded information will be used to determine the next step in treatment.  Pain evaluated is that of treated area only. Do not include pain from an untreated area.  Complete every hour, on the hour, for the initial 8 hours. Set an alarm to help you do this part accurately.  Do not go to sleep and have it completed later. It will not be accurate.  Follow-up appointment: Keep your follow-up appointment after the procedure. Usually 2 weeks for most procedures. (6 weeks in the case of radiofrequency.) Bring you pain diary.  Expect:  From numbing medicine (AKA: Local Anesthetics): Numbness or decrease in pain.  Onset: Full effect within 15 minutes of injected.  Duration: It will depend on the type of local anesthetic used. On the average, 1 to 8  hours.   From steroids: Decrease in swelling or inflammation. Once inflammation is improved, relief of the pain will follow.  Onset of benefits: Depends on the amount of swelling present. The more swelling, the longer it will take for the benefits to be seen.   Duration: Steroids will stay in the system x 2 weeks. Duration of benefits will depend on multiple posibilities including persistent irritating factors.  From procedure: Some discomfort is to be expected once the numbing medicine wears off. This should be minimal if ice and heat are applied as instructed. Call if:  You experience numbness and weakness that gets worse with time, as opposed to wearing off.  New onset bowel or bladder incontinence. (Spinal procedures only)  Emergency Numbers:  Durning business hours (Monday - Thursday, 8:00 AM - 4:00 PM) (Friday, 9:00 AM - 12:00 Noon): [phone]  After hours: [phone] _____________________________________________________________________________________________  Pain Management Discharge Instructions  General Discharge Instructions :  If you need to reach your doctor call: Monday-Friday 8:00 am - 4:00 pm at [phone] or toll free [phone].  After clinic hours [phone] to have operator reach doctor.  Bring all of your medication bottles to all your appointments in the pain clinic.  To cancel or reschedule your appointment with Pain Management please remember to call 24 hours in advance to avoid a fee.  Refer to the educational materials which you have been given on: General Risks, I had my Procedure. Discharge Instructions, Post Sedation.  Post Procedure Instructions:  The drugs you were given will stay in your system until  tomorrow, so for the next 24 hours you should not drive, make any legal decisions or drink any alcoholic beverages.  You may eat anything you prefer, but it is better to start with liquids then soups and crackers, and gradually work  up to solid foods.  Please notify your doctor immediately if you have any unusual bleeding, trouble breathing or pain that is not related to your normal pain.  Depending on the type of procedure that was done, some parts of your body may feel week and/or numb.  This usually clears up by tonight or the next day.  Walk with the use of an assistive device or accompanied by an adult for the 24 hours.  You may use ice on the affected area for the first 24 hours.  Put ice in a Ziploc bag and cover with a towel and place against area 15 minutes on 15 minutes off.  You may switch to heat after 24 hours. Epidural Steroid Injection An epidural steroid injection is a shot of steroid medicine and numbing medicine that is given into the space between the spinal cord and the bones in your back (epidural space). The shot helps relieve pain caused by an irritated or swollen nerve root. The amount of pain relief you get from the injection depends on what is causing the nerve to be swollen and irritated, and how long your pain lasts. You are more likely to benefit from this injection if your pain is strong and comes on suddenly rather than if you have had pain for a long time. Tell a health care provider about:  Any allergies you have.  All medicines you are taking, including vitamins, herbs, eye drops, creams, and over-the-counter medicines.  Any problems you or family members have had with anesthetic medicines.  Any blood disorders you have.  Any surgeries you have had.  Any medical conditions you have.  Whether you are pregnant or may be pregnant. What are the risks? Generally, this is a safe procedure. However, problems may occur, including:  Headache.  Bleeding.  Infection.  Allergic reaction to medicines.  Damage to your nerves. What happens before the procedure? Staying hydrated  Follow instructions from your health care provider about hydration, which may include:  Up to 2 hours  before the procedure - you may continue to drink clear liquids, such as water, clear fruit juice, black coffee, and plain tea. Eating and drinking restrictions  Follow instructions from your health care provider about eating and drinking, which may include:  8 hours before the procedure - stop eating heavy meals or foods such as meat, fried foods, or fatty foods.  6 hours before the procedure - stop eating light meals or foods, such as toast or cereal.  6 hours before the procedure - stop drinking milk or drinks that contain milk.  2 hours before the procedure - stop drinking clear liquids. Medicine   You may be given medicines to lower anxiety.  Ask your health care provider about:  Changing or stopping your regular medicines. This is especially important if you are taking diabetes medicines or blood thinners.  Taking medicines such as aspirin and ibuprofen. These medicines can thin your blood. Do not take these medicines before your procedure if your health care provider instructs you not to. General instructions   Plan to have someone take you home from the hospital or clinic. What happens during the procedure?  You may receive a medicine to help you relax (sedative).  You  will be asked to lie on your abdomen.  The injection site will be cleaned.  A numbing medicine (local anesthetic) will be used to numb the injection site.  A needle will be inserted through your skin into the epidural space. You may feel some discomfort when this happens. An X-ray machine will be used to make sure the needle is put as close as possible to the affected nerve.  A steroid medicine and a local anesthetic will be injected into the epidural space.  The needle will be removed.  A bandage (dressing) will be put over the injection site. What happens after the procedure?  Your blood pressure, heart rate, breathing rate, and blood oxygen level will be monitored until the medicines you were given  have worn off.  Your arm or leg may feel weak or numb for a few hours.  The injection site may feel sore.  Do not drive for 24 hours if you received a sedative. This information is not intended to replace advice given to you by your health care provider. Make sure you discuss any questions you have with your health care provider. Document Released: 09/30/2007 Document Revised: 12/05/2015 Document Reviewed: 10/09/2015 Elsevier Interactive Patient Education  2017 [employer].

## 2016-11-05 NOTE — Progress Notes (Signed)
Safety precautions to be maintained throughout the outpatient stay will include: orient to surroundings, keep bed in low position, maintain call bell within reach at all times, provide assistance with transfer out of bed and ambulation.  

## 2016-11-06 ENCOUNTER — Telehealth: Payer: Self-pay | Admitting: *Deleted

## 2016-11-06 ENCOUNTER — Telehealth: Payer: Self-pay

## 2016-11-06 NOTE — Telephone Encounter (Signed)
Spoke with patient re; procedure states her chest and face are a little flushed, explained that this most likely is coming from steroid injection.  Denies fever at this time.  Also c/o soreness at site of injection, again explained this is a normal finding, patient is already using ice and heat alternately. Explained procedure rational and verbalizes u/o.  Patient states that she noticed after the IV she noticed some edema in her arm and legs and feels that it is still there, explained that I do not feel that this is an allergic reaction.  Encouraged patient to drink plenty of fluids today to decrease edema.  Patient verbalizes u/o information.

## 2016-11-06 NOTE — Telephone Encounter (Signed)
No answer, left message and to instructed to call if needed.

## 2016-11-13 ENCOUNTER — Ambulatory Visit: Payer: BLUE CROSS/BLUE SHIELD | Admitting: Family Medicine

## 2016-11-14 ENCOUNTER — Encounter: Payer: Self-pay | Admitting: Family Medicine

## 2016-11-14 ENCOUNTER — Ambulatory Visit (INDEPENDENT_AMBULATORY_CARE_PROVIDER_SITE_OTHER): Payer: BLUE CROSS/BLUE SHIELD | Admitting: Family Medicine

## 2016-11-14 VITALS — BP 114/78 | HR 71 | Temp 98.3°F | Resp 20 | Wt 146.0 lb

## 2016-11-14 DIAGNOSIS — E785 Hyperlipidemia, unspecified: Secondary | ICD-10-CM | POA: Diagnosis not present

## 2016-11-14 DIAGNOSIS — I10 Essential (primary) hypertension: Secondary | ICD-10-CM | POA: Diagnosis not present

## 2016-11-14 MED ORDER — LOSARTAN POTASSIUM 50 MG PO TABS: 50.0000 mg | ORAL_TABLET | Freq: Every day | ORAL | 1 refills | Status: DC

## 2016-11-14 NOTE — Patient Instructions (Signed)
[address] [place]  [place], [place] [address]  [phone] [facility].    BP looks great!    Hypertension Hypertension, commonly called high blood pressure, is when the force of blood pumping through the arteries is too strong. The arteries are the blood vessels that carry blood from the heart throughout the body. Hypertension forces the heart to work harder to pump blood and may cause arteries to become narrow or stiff. Having untreated or uncontrolled hypertension can cause heart attacks, strokes, kidney disease, and other problems. A blood pressure reading consists of a higher number over a lower number. Ideally, your blood pressure should be below 120/80. The first ("top") number is called the systolic pressure. It is a measure of the pressure in your arteries as your heart beats. The second ("bottom") number is called the diastolic pressure. It is a measure of the pressure in your arteries as the heart relaxes. What are the causes? The cause of this condition is not known. What increases the risk? Some risk factors for high blood pressure are under your control. Others are not. Factors you can change   Smoking.  Having type 2 diabetes mellitus, high cholesterol, or both.  Not getting enough exercise or physical activity.  Being overweight.  Having too much fat, sugar, calories, or salt (sodium) in your diet.  Drinking too much alcohol. Factors that are difficult or impossible to change   Having chronic kidney disease.  Having a family history of high blood pressure.  Age. Risk increases with age.  Race. You may be at higher risk if you are African-American.  Gender. Men are at higher risk than women before age 5. After age 40, women are at higher risk than men.  Having obstructive sleep apnea.  Stress. What are the signs or symptoms? Extremely high blood pressure (hypertensive crisis) may cause:  Headache.  Anxiety.  Shortness  of breath.  Nosebleed.  Nausea and vomiting.  Severe chest pain.  Jerky movements you cannot control (seizures). How is this diagnosed? This condition is diagnosed by measuring your blood pressure while you are seated, with your arm resting on a surface. The cuff of the blood pressure monitor will be placed directly against the skin of your upper arm at the level of your heart. It should be measured at least twice using the same arm. Certain conditions can cause a difference in blood pressure between your right and left arms. Certain factors can cause blood pressure readings to be lower or higher than normal (elevated) for a short period of time:  When your blood pressure is higher when you are in a health care provider's office than when you are at home, this is called white coat hypertension. Most people with this condition do not need medicines.  When your blood pressure is higher at home than when you are in a health care provider's office, this is called masked hypertension. Most people with this condition may need medicines to control blood pressure. If you have a high blood pressure reading during one visit or you have normal blood pressure with other risk factors:  You may be asked to return on a different day to have your blood pressure checked again.  You may be asked to monitor your blood pressure at home for 1 week or longer. If you are diagnosed with hypertension, you may have other blood or imaging tests to help your health care provider understand your overall risk for other conditions. How  is this treated? This condition is treated by making healthy lifestyle changes, such as eating healthy foods, exercising more, and reducing your alcohol intake. Your health care provider may prescribe medicine if lifestyle changes are not enough to get your blood pressure under control, and if:  Your systolic blood pressure is above 130.  Your diastolic blood pressure is above 80. Your  personal target blood pressure may vary depending on your medical conditions, your age, and other factors. Follow these instructions at home: Eating and drinking   Eat a diet that is high in fiber and potassium, and low in sodium, added sugar, and fat. An example eating plan is called the DASH (Dietary Approaches to Stop Hypertension) diet. To eat this way:  Eat plenty of fresh fruits and vegetables. Try to fill half of your plate at each meal with fruits and vegetables.  Eat whole grains, such as whole wheat pasta, brown rice, or whole grain bread. Fill about one quarter of your plate with whole grains.  Eat or drink low-fat dairy products, such as skim milk or low-fat yogurt.  Avoid fatty cuts of meat, processed or cured meats, and poultry with skin. Fill about one quarter of your plate with lean proteins, such as fish, chicken without skin, beans, eggs, and tofu.  Avoid premade and processed foods. These tend to be higher in sodium, added sugar, and fat.  Reduce your daily sodium intake. Most people with hypertension should eat less than 1,500 mg of sodium a day.  Limit alcohol intake to no more than 1 drink a day for nonpregnant women and 2 drinks a day for men. One drink equals 12 oz of beer, 5 oz of wine, or 1 oz of hard liquor. Lifestyle   Work with your health care provider to maintain a healthy body weight or to lose weight. Ask what an ideal weight is for you.  Get at least 30 minutes of exercise that causes your heart to beat faster (aerobic exercise) most days of the week. Activities may include walking, swimming, or biking.  Include exercise to strengthen your muscles (resistance exercise), such as pilates or lifting weights, as part of your weekly exercise routine. Try to do these types of exercises for 30 minutes at least 3 days a week.  Do not use any products that contain nicotine or tobacco, such as cigarettes and e-cigarettes. If you need help quitting, ask your health  care provider.  Monitor your blood pressure at home as told by your health care provider.  Keep all follow-up visits as told by your health care provider. This is important. Medicines   Take over-the-counter and prescription medicines only as told by your health care provider. Follow directions carefully. Blood pressure medicines must be taken as prescribed.  Do not skip doses of blood pressure medicine. Doing this puts you at risk for problems and can make the medicine less effective.  Ask your health care provider about side effects or reactions to medicines that you should watch for. Contact a health care provider if:  You think you are having a reaction to a medicine you are taking.  You have headaches that keep coming back (recurring).  You feel dizzy.  You have swelling in your ankles.  You have trouble with your vision. Get help right away if:  You develop a severe headache or confusion.  You have unusual weakness or numbness.  You feel faint.  You have severe pain in your chest or abdomen.  You vomit  repeatedly.  You have trouble breathing. Summary  Hypertension is when the force of blood pumping through your arteries is too strong. If this condition is not controlled, it may put you at risk for serious complications.  Your personal target blood pressure may vary depending on your medical conditions, your age, and other factors. For most people, a normal blood pressure is less than 120/80.  Hypertension is treated with lifestyle changes, medicines, or a combination of both. Lifestyle changes include weight loss, eating a healthy, low-sodium diet, exercising more, and limiting alcohol. This information is not intended to replace advice given to you by your health care provider. Make sure you discuss any questions you have with your health care provider. Document Released: 06/23/2005 Document Revised: 05/21/2016 Document Reviewed: 05/21/2016 Elsevier Interactive  Patient Education  2017 [employer].

## 2016-11-14 NOTE — Progress Notes (Signed)
Sarah Fisher , 1961-03-24, 56 y.o., female MRN: [id number] Patient Care Team    Relationship Specialty Notifications Start End  Ma Hillock, DO PCP - General Family Medicine  04/14/16     Chief Complaint  Patient presents with  . Hypertension     Subjective:     Sarah Fisher , 12-Feb-1961, 56 y.o., female MRN: [id number] Patient Care Team    Relationship Specialty Notifications Start End  Ma Hillock, DO PCP - General Family Medicine  04/14/16     Chief Complaint  Patient presents with  . Hypertension     Subjective:  Hypertension/hyperlipidemia:  Pt reports compliance with Losartan 50 mg QD. Blood pressures ranges at home WNL. Patient denies chest pain, shortness of breath or lower extremity edema.  Pt is  prescribed statin. BMP: UTD 09/2016 CBC: UTD 09/2016 Diet: low sodium Exercise: Not able to do exercises 2/2 chronic pain RF: HLD  Depression screen [facility] 2/9 11/05/2016 10/21/2016 10/01/2016 05/15/2016 04/14/2016  Decreased Interest 0 0 0 0 0  Down, Depressed, Hopeless 0 0 0 0 0  PHQ - 2 Score 0 0 0 0 0    No Known Allergies Social History  Substance Use Topics  . Smoking status: Never Smoker  . Smokeless tobacco: Never Used  . Alcohol use No     Comment: a glass of wine occasionally   Past Medical History:  Diagnosis Date  . Anemia   . Arthritis   . Cervical spine degeneration 2010   C5-6  . History of carpal tunnel syndrome   . Hyperlipidemia   . Hypertension   . Sacroiliac joint pain 2010   right  . Scoliosis   . Skin cancer of eyelid    Past Surgical History:  Procedure Laterality Date  . BREAST SURGERY  1986   reduction  . cervical facet block  2010  . HAND SURGERY Bilateral 2000 and 2016   benign tumor removal   . WISDOM TOOTH EXTRACTION  1979   Family History  Problem Relation Age of Onset  . Hypertension Mother   . Arthritis Mother   . Hypertension Father   . Liver cancer Father   . Hypertension Sister   . Arthritis  Maternal Aunt   . Hypertension Maternal Grandmother   . Kidney cancer Maternal Grandmother   . Alzheimer's disease Maternal Grandfather   . Colon cancer Maternal Grandfather 49  . Diabetes Maternal Grandfather   . Alzheimer's disease Paternal Grandmother   . Esophageal cancer Neg Hx   . Rectal cancer Neg Hx   . Stomach cancer Neg Hx    Allergies as of 11/14/2016   No Known Allergies     Medication List       Accurate as of 11/14/16 11:29 AM. Always use your most recent med list.          CALCIUM 1200+D3 PO Take 1 capsule by mouth daily.   gabapentin 100 MG capsule Commonly known as:  NEURONTIN Take 1-3 capsules (100-300 mg total) by mouth every 8 (eight) hours.   Green Tea 315 MG Caps Take by mouth daily.   ibuprofen 200 MG tablet Commonly known as:  ADVIL,MOTRIN Take 400 mg by mouth every 4 (four) hours as needed.   losartan 50 MG tablet Commonly known as:  COZAAR Take 1 tablet (50 mg total) by mouth daily.   multivitamin tablet Take 1 tablet by mouth daily. With vit D   Omega 3 1000 MG Caps Take  1 capsule by mouth daily.   PEPCID COMPLETE PO Take by mouth as needed.   simvastatin 10 MG tablet Commonly known as:  ZOCOR Take 1 tablet (10 mg total) by mouth daily.   tizanidine 2 MG capsule Commonly known as:  ZANAFLEX Take 1 capsule (2 mg total) by mouth 3 (three) times daily as needed for muscle spasms.   traMADol 50 MG tablet Commonly known as:  ULTRAM Take 1 tablet (50 mg total) by mouth every 6 (six) hours as needed for severe pain.       All past medical history, surgical history, allergies, family history, immunizations andmedications were updated in the EMR today and reviewed under the history and medication portions of their EMR.     ROS: Negative, with the exception of above mentioned in HPI   Objective:  BP 114/78 (BP Location: Right Arm, Patient Position: Sitting, Cuff Size: Normal)   Pulse 71   Temp 98.3 F (36.8 C)   Resp 20   Wt  146 lb (66.2 kg)   SpO2 97%   BMI 28.51 kg/m  Body mass index is 28.51 kg/m. Gen: Afebrile. No acute distress. Nontoxic in appearance, well developed, well nourished.  HENT: AT. Waucoma. MMM Eyes:Pupils Equal Round Reactive to light, Extraocular movements intact,  Conjunctiva without redness, discharge or icterus. CV: RRR, no edema Chest: CTAB, no wheeze or crackles. Good air movement, normal resp effort.  Abd: Soft. NTND. BS present. no Masses palpated.  Skin: no rashes, purpura or petechiae. WWP.  Neuro:  Normal gait. PERLA. EOMi. Alert. Oriented x3   No exam data present No results found. No results found for this or any previous visit (from the past 24 hour(s)).  Assessment/Plan: Sarah Fisher is a 56 y.o. female present for OV for  Essential hypertension Hyperlipidemia, unspecified hyperlipidemia type - low sodium, try to increase exercise. Discussed trying aquatic therapy. Provided resources for [employer]. OK to place referral for formal therpay, since she would benefit with all her MSK pain/condition. Pt will call back if needing a formal referral.  - BMP, CBC UTD.  - stable BP reading. Continue losartan 50 mg QD. Refills provided today for 6 months.  - F/U 6 months.    Reviewed expectations re: course of current medical issues.  Discussed self-management of symptoms.  Outlined signs and symptoms indicating need for more acute intervention.  Patient verbalized understanding and all questions were answered.  Patient received an After-Visit Summary.   Note is dictated utilizing voice recognition software. Although note has been proof read prior to signing, occasional typographical errors still can be missed. If any questions arise, please do not hesitate to call for verification.   electronically signed by:  Howard Pouch, DO  [place]

## 2016-12-08 ENCOUNTER — Ambulatory Visit: Payer: BLUE CROSS/BLUE SHIELD | Attending: Pain Medicine | Admitting: Pain Medicine

## 2016-12-08 ENCOUNTER — Encounter: Payer: Self-pay | Admitting: Pain Medicine

## 2016-12-08 ENCOUNTER — Ambulatory Visit: Payer: BLUE CROSS/BLUE SHIELD | Admitting: Pain Medicine

## 2016-12-08 VITALS — BP 119/76 | HR 72 | Temp 98.5°F | Resp 16 | Ht 60.0 in | Wt 147.0 lb

## 2016-12-08 DIAGNOSIS — M06019 Rheumatoid arthritis without rheumatoid factor, unspecified shoulder: Secondary | ICD-10-CM

## 2016-12-08 DIAGNOSIS — G8929 Other chronic pain: Secondary | ICD-10-CM

## 2016-12-08 DIAGNOSIS — Z79891 Long term (current) use of opiate analgesic: Secondary | ICD-10-CM | POA: Insufficient documentation

## 2016-12-08 DIAGNOSIS — M4802 Spinal stenosis, cervical region: Secondary | ICD-10-CM | POA: Insufficient documentation

## 2016-12-08 DIAGNOSIS — Z79899 Other long term (current) drug therapy: Secondary | ICD-10-CM | POA: Insufficient documentation

## 2016-12-08 DIAGNOSIS — E559 Vitamin D deficiency, unspecified: Secondary | ICD-10-CM | POA: Diagnosis not present

## 2016-12-08 DIAGNOSIS — G894 Chronic pain syndrome: Secondary | ICD-10-CM | POA: Diagnosis not present

## 2016-12-08 DIAGNOSIS — M5412 Radiculopathy, cervical region: Secondary | ICD-10-CM | POA: Diagnosis not present

## 2016-12-08 DIAGNOSIS — M542 Cervicalgia: Secondary | ICD-10-CM

## 2016-12-08 DIAGNOSIS — M4722 Other spondylosis with radiculopathy, cervical region: Secondary | ICD-10-CM

## 2016-12-08 DIAGNOSIS — M069 Rheumatoid arthritis, unspecified: Secondary | ICD-10-CM | POA: Diagnosis not present

## 2016-12-08 DIAGNOSIS — M7918 Myalgia, other site: Secondary | ICD-10-CM

## 2016-12-08 DIAGNOSIS — M50322 Other cervical disc degeneration at C5-C6 level: Secondary | ICD-10-CM | POA: Diagnosis not present

## 2016-12-08 DIAGNOSIS — E785 Hyperlipidemia, unspecified: Secondary | ICD-10-CM | POA: Diagnosis not present

## 2016-12-08 DIAGNOSIS — Z808 Family history of malignant neoplasm of other organs or systems: Secondary | ICD-10-CM | POA: Insufficient documentation

## 2016-12-08 DIAGNOSIS — M479 Spondylosis, unspecified: Secondary | ICD-10-CM | POA: Insufficient documentation

## 2016-12-08 DIAGNOSIS — M791 Myalgia: Secondary | ICD-10-CM | POA: Diagnosis not present

## 2016-12-08 DIAGNOSIS — D649 Anemia, unspecified: Secondary | ICD-10-CM | POA: Diagnosis not present

## 2016-12-08 DIAGNOSIS — M4682 Other specified inflammatory spondylopathies, cervical region: Secondary | ICD-10-CM

## 2016-12-08 DIAGNOSIS — M170 Bilateral primary osteoarthritis of knee: Secondary | ICD-10-CM | POA: Insufficient documentation

## 2016-12-08 DIAGNOSIS — M419 Scoliosis, unspecified: Secondary | ICD-10-CM | POA: Diagnosis not present

## 2016-12-08 DIAGNOSIS — H8112 Benign paroxysmal vertigo, left ear: Secondary | ICD-10-CM | POA: Insufficient documentation

## 2016-12-08 DIAGNOSIS — Z833 Family history of diabetes mellitus: Secondary | ICD-10-CM | POA: Diagnosis not present

## 2016-12-08 DIAGNOSIS — M25511 Pain in right shoulder: Secondary | ICD-10-CM | POA: Insufficient documentation

## 2016-12-08 DIAGNOSIS — I10 Essential (primary) hypertension: Secondary | ICD-10-CM | POA: Insufficient documentation

## 2016-12-08 DIAGNOSIS — Z8051 Family history of malignant neoplasm of kidney: Secondary | ICD-10-CM | POA: Insufficient documentation

## 2016-12-08 DIAGNOSIS — Z9889 Other specified postprocedural states: Secondary | ICD-10-CM | POA: Insufficient documentation

## 2016-12-08 DIAGNOSIS — M25512 Pain in left shoulder: Secondary | ICD-10-CM | POA: Diagnosis not present

## 2016-12-08 DIAGNOSIS — M4692 Unspecified inflammatory spondylopathy, cervical region: Secondary | ICD-10-CM

## 2016-12-08 DIAGNOSIS — M792 Neuralgia and neuritis, unspecified: Secondary | ICD-10-CM

## 2016-12-08 NOTE — Progress Notes (Signed)
Safety precautions to be maintained throughout the outpatient stay will include: orient to surroundings, keep bed in low position, maintain call bell within reach at all times, provide assistance with transfer out of bed and ambulation.  

## 2016-12-08 NOTE — Progress Notes (Signed)
Patient's Name: Sarah Fisher  MRN: [id number]  Referring Provider: Ma Hillock, DO  DOB: [birth year]  PCP: Ma Hillock, DO  DOS: 12/08/2016  Note by: Kathlen Brunswick. Dossie Arbour, MD  Service setting: Ambulatory outpatient  Specialty: Interventional Pain Management  Location: [facility] (AMB) Pain Management Facility    Patient type: Established   Primary Reason(s) for Visit: Encounter for post-procedure evaluation of chronic illness with mild to moderate exacerbation CC: Neck Pain (stiffness)  HPI  Sarah Fisher is a 56 y.o. year old, female patient, who comes today for a post-procedure evaluation. She has Hypertension; Hyperlipidemia; BMI 28.0-28.9,adult; Vitamin D deficiency; Benign paroxysmal positional vertigo of left ear; Epigastric abdominal pain; Adenomatous polyp of colon; Chronic pain syndrome; Osteoarthritis of cervical spine; Cervical DDD (degenerative disc disease) (C5-6); Cervical central spinal stenosis C5-6 (Right); Chronic neck pain (Location of Primary Source of Pain) (Bilateral) (L>R); Cervicogenic headache (Bilateral) (R>L); Occipital headache (Bilateral) (R>L); Neurogenic pain; Myofascial pain; Chronic shoulder pain (Location of Tertiary source of pain) (Bilateral) (L>R); Chronic upper back pain (Location of Secondary source of pain) (Bilateral) (L>R); Chronic low back pain (Bilateral) (L>R); Cervical spondylosis; Numbness of upper extremity (Bilateral) (L>R); Chronic cervical radiculopathy (Bilateral) (L>R); Chronic knee pain (Bilateral) (R>L); Osteoarthritis of knee (Bilateral) (R>L); Rheumatoid arthritis, seronegative, shoulder region (Bilateral) (L>R); Chronic idiopathic constipation; and Long term (current) use of opiate analgesic on her problem list. Her primarily concern today is the Neck Pain (stiffness)  Pain Assessment: Self-Reported Pain Score: 1 /10             Reported level is compatible with observation.       Pain Type: Chronic pain Pain Location: Neck Pain Orientation:  Mid, Right, Left Pain Descriptors / Indicators: Constant (stiffness) Pain Frequency: Constant  Sarah Fisher comes in today for post-procedure evaluation after the treatment done on 11/05/2016. According to the patient, she attained no significant benefit from the diagnostic injection. One possibility is that the pain may be, from the outside portion of the cervical spine (facet joints), as opposed to coming from structures within the inner canal. In any case, today we spent a great amount of time going over the medications and how to take him since she seemed to have a lot of questions regarding that. Taking 200 mg of Neurontin in addition to 2 mg of tizanadine apparently made her feel very dizzy and uncomfortable. I provided her with some suggestions as to how to take the medicine including diluting the 2 mg tizanadine capsule in a glass of water and drinking only half to get approximately 1 mg. She was asked to evaluated again using this technique. In addition we went over how to properly take her Neurontin, tizanadine, and the tramadol. She was reminded that she can also take Tylenol and ibuprofen in order to minimize the use of the prescription drugs.  At this point, I will have the patient follow up with our nurse practitioner, Crystal cane, NP for medication management.  Further details on both, my assessment(s), as well as the proposed treatment plan, please see below.  Post-Procedure Assessment  11/05/2016 Procedure: Diagnostic left sided cervical epidural steroid injection under fluoroscopic guidance and IV sedation Pre-procedure pain score:  3/10 Post-procedure pain score: 0/10 (100% relief) Influential Factors: BMI: 28.71 kg/m Intra-procedural challenges: None observed Assessment challenges: Results reported today are inconsistent with those reported on procedure day, immediately before discharge. Previously the patient had reported 100% relief of the pain, before leaving the  facility Post-procedural side-effects, adverse reactions, or  complications: None reported Reported issues: None  Sedation: Sedation provided. When no sedatives are used, the analgesic levels obtained are directly associated to the effectiveness of the local anesthetics. However, when sedation is provided, the level of analgesia obtained during the initial 1 hour following the intervention, is believed to be the result of a combination of factors. These factors may include, but are not limited to: 1. The effectiveness of the local anesthetics used. 2. The effects of the analgesic(s) and/or anxiolytic(s) used. 3. The degree of discomfort experienced by the patient at the time of the procedure. 4. The patients ability and reliability in recalling and recording the events. 5. The presence and influence of possible secondary gains and/or psychosocial factors. Reported result: Relief experienced during the 1st hour after the procedure: 0 % (Ultra-Short Term Relief) Interpretative annotation: Patient did not keep pain diary. Patient does not appear to have understood instructions on differential evaluation of treated vs untreated area, leading to an inaccurate global report  Effects of local anesthetic: The analgesic effects attained during this period are directly associated to the localized infiltration of local anesthetics and therefore cary significant diagnostic value as to the etiological location, or anatomical origin, of the pain. Expected duration of relief is directly dependent on the pharmacodynamics of the local anesthetic used. Long-acting (4-6 hours) anesthetics used.  Reported result: Relief during the next 4 to 6 hour after the procedure: 0 % (Short-Term Relief) Interpretative annotation: Inaccurate and unreliable report.          Long-term benefit: Defined as the period of time past the expected duration of local anesthetics. With the possible exception of prolonged sympathetic blockade  from the local anesthetics, benefits during this period are typically attributed to, or associated with, other factors such as analgesic sensory neuropraxia, antiinflammatory effects, or beneficial biochemical changes provided by agents other than the local anesthetics Reported result: Extended relief following procedure: 10 % (pain is a little less frequent, but the intensity is the same.) (Long-Term Relief) Interpretative annotation: No long-term benefit. This could suggest limited inflammatory component to the pain with possible mechanical aggravating factors.          Current benefits: Defined as persistent relief that continues at this point in time.   Reported results: Treated area: 0 %       Interpretative annotation: No benefit whatsoever. This would argue against repeating therapy  Interpretation: Results would suggest non-involvement of the tested area.          Laboratory Chemistry  Inflammation Markers Lab Results  Component Value Date   CRP <0.8 10/01/2016   ESRSEDRATE 21 10/01/2016   (CRP: Acute Phase) (ESR: Chronic Phase) Renal Function Markers Lab Results  Component Value Date   BUN 17 10/01/2016   CREATININE 0.61 10/01/2016   GFRAA >60 10/01/2016   GFRNONAA >60 10/01/2016   Hepatic Function Markers Lab Results  Component Value Date   AST 23 10/01/2016   ALT 18 10/01/2016   ALBUMIN 4.4 10/01/2016   ALKPHOS 57 10/01/2016   HCVAB NEGATIVE 05/15/2016   Electrolytes Lab Results  Component Value Date   NA 139 10/01/2016   K 3.9 10/01/2016   CL 105 10/01/2016   CALCIUM 9.4 10/01/2016   MG 2.2 10/01/2016   Neuropathy Markers Lab Results  Component Value Date   VITAMINB12 674 10/01/2016   Bone Pathology Markers Lab Results  Component Value Date   ALKPHOS 57 10/01/2016   VD25OH 26.44 (L) 09/03/2016   25OHVITD1 40 10/01/2016  25OHVITD2 18 10/01/2016   25OHVITD3 22 10/01/2016   CALCIUM 9.4 10/01/2016   Coagulation Parameters Lab Results  Component  Value Date   PLT 284.0 05/15/2016   Cardiovascular Markers Lab Results  Component Value Date   HGB 12.5 05/15/2016   HCT 37.1 05/15/2016   Note: Lab results reviewed.  Recent Diagnostic Imaging Review  Dg C-arm 1-60 Min-no Report  Result Date: 11/05/2016 Fluoroscopy was utilized by the requesting physician.  No radiographic interpretation.   Note: Imaging results reviewed.          Meds  The patient has a current medication list which includes the following prescription(s): calcium-magnesium-vitamin d, famotidine-ca carb-mag hydrox, gabapentin, green tea, ibuprofen, losartan, multivitamin, omega 3, simvastatin, tizanidine, and tramadol.  Current Outpatient Prescriptions on File Prior to Visit  Medication Sig  . Calcium-Magnesium-Vitamin D (CALCIUM 1200+D3 PO) Take 1 capsule by mouth daily.  . Famotidine-Ca Carb-Mag Hydrox (PEPCID COMPLETE PO) Take by mouth as needed.  . gabapentin (NEURONTIN) 100 MG capsule Take 1-3 capsules (100-300 mg total) by mouth every 8 (eight) hours.  [name] Tea 315 MG CAPS Take by mouth daily.  Marland Kitchen ibuprofen (ADVIL,MOTRIN) 200 MG tablet Take 400 mg by mouth every 4 (four) hours as needed.   Marland Kitchen losartan (COZAAR) 50 MG tablet Take 1 tablet (50 mg total) by mouth daily.  . Multiple Vitamin (MULTIVITAMIN) tablet Take 1 tablet by mouth daily. With vit D  . Omega 3 1000 MG CAPS Take 1 capsule by mouth daily.  . simvastatin (ZOCOR) 10 MG tablet Take 1 tablet (10 mg total) by mouth daily.  . tizanidine (ZANAFLEX) 2 MG capsule Take 1 capsule (2 mg total) by mouth 3 (three) times daily as needed for muscle spasms.  . traMADol (ULTRAM) 50 MG tablet Take 1 tablet (50 mg total) by mouth every 6 (six) hours as needed for severe pain.   No current facility-administered medications on file prior to visit.    ROS  Constitutional: Denies any fever or chills Gastrointestinal: No reported hemesis, hematochezia, vomiting, or acute GI distress Musculoskeletal: Denies any  acute onset joint swelling, redness, loss of ROM, or weakness Neurological: No reported episodes of acute onset apraxia, aphasia, dysarthria, agnosia, amnesia, paralysis, loss of coordination, or loss of consciousness  Allergies  Sarah Fisher has No Known Allergies.  [place]  Drug: Sarah Fisher  reports that she does not use drugs. Alcohol:  reports that she does not drink alcohol. Tobacco:  reports that she has never smoked. She has never used smokeless tobacco. Medical:  has a past medical history of Anemia; Arthritis; Cervical spine degeneration (2010); History of carpal tunnel syndrome; Hyperlipidemia; Hypertension; Sacroiliac joint pain (2010); Scoliosis; and Skin cancer of eyelid. Family: family history includes Alzheimer's disease in her maternal grandfather and paternal grandmother; Arthritis in her maternal aunt and mother; Colon cancer (age of onset: 34) in her maternal grandfather; Diabetes in her maternal grandfather; Hypertension in her father, maternal grandmother, mother, and sister; Kidney cancer in her maternal grandmother; Liver cancer in her father.  Past Surgical History:  Procedure Laterality Date  . BREAST SURGERY  1986   reduction  . cervical facet block  2010  . HAND SURGERY Bilateral 2000 and 2016   benign tumor removal   . [place]   Constitutional Exam  General appearance: Well nourished, well developed, and well hydrated. In no apparent acute distress Vitals:   12/08/16 1422  BP: 119/76  Pulse: 72  Resp: 16  Temp:  98.5 F (36.9 C)  SpO2: 96%  Weight: 147 lb (66.7 kg)  Height: 5' (1.524 m)   BMI Assessment: Estimated body mass index is 28.71 kg/m as calculated from the following:   Height as of this encounter: 5' (1.524 m).   Weight as of this encounter: 147 lb (66.7 kg).  BMI interpretation table: BMI level Category Range association with higher incidence of chronic pain  <18 kg/m2 Underweight   18.5-24.9 kg/m2 Ideal body weight    25-29.9 kg/m2 Overweight Increased incidence by 20%  30-34.9 kg/m2 Obese (Class I) Increased incidence by 68%  35-39.9 kg/m2 Severe obesity (Class II) Increased incidence by 136%  >40 kg/m2 Extreme obesity (Class III) Increased incidence by 254%   BMI Readings from Last 4 Encounters:  12/08/16 28.71 kg/m  11/14/16 28.51 kg/m  11/05/16 28.51 kg/m  10/21/16 28.12 kg/m   Wt Readings from Last 4 Encounters:  12/08/16 147 lb (66.7 kg)  11/14/16 146 lb (66.2 kg)  11/05/16 146 lb (66.2 kg)  10/21/16 144 lb (65.3 kg)  Psych/Mental status: Alert, oriented x 3 (person, place, & time)       Eyes: PERLA Respiratory: No evidence of acute respiratory distress  Cervical Spine Exam  Inspection: No masses, redness, or swelling Alignment: Symmetrical Functional ROM: Diminished ROM      Stability: No instability detected Muscle strength & Tone: Functionally intact Sensory: Movement-associated discomfort Palpation: Tender              Upper Extremity (UE) Exam    Side: Right upper extremity  Side: Left upper extremity  Inspection: No masses, redness, swelling, or asymmetry. No contractures  Inspection: No masses, redness, swelling, or asymmetry. No contractures  Functional ROM: Unrestricted ROM          Functional ROM: Unrestricted ROM          Muscle strength & Tone: Functionally intact  Muscle strength & Tone: Functionally intact  Sensory: Unimpaired  Sensory: Unimpaired  Palpation: No palpable anomalies              Palpation: No palpable anomalies              Specialized Test(s): Deferred         Specialized Test(s): Deferred          Thoracic Spine Exam  Inspection: No masses, redness, or swelling Alignment: Symmetrical Functional ROM: Unrestricted ROM Stability: No instability detected Sensory: Unimpaired Muscle strength & Tone: No palpable anomalies  Lumbar Spine Exam  Inspection: No masses, redness, or swelling Alignment: Symmetrical Functional ROM: Unrestricted ROM       Stability: No instability detected Muscle strength & Tone: Functionally intact Sensory: Unimpaired Palpation: No palpable anomalies       Provocative Tests: Lumbar Hyperextension and rotation test: evaluation deferred today       Patrick's Maneuver: evaluation deferred today                    Gait & Posture Assessment  Ambulation: Unassisted Gait: Relatively normal for age and body habitus Posture: WNL   Lower Extremity Exam    Side: Right lower extremity  Side: Left lower extremity  Inspection: No masses, redness, swelling, or asymmetry. No contractures  Inspection: No masses, redness, swelling, or asymmetry. No contractures  Functional ROM: Unrestricted ROM          Functional ROM: Unrestricted ROM          Muscle strength & Tone: Functionally intact  Muscle strength &  Tone: Functionally intact  Sensory: Unimpaired  Sensory: Unimpaired  Palpation: No palpable anomalies  Palpation: No palpable anomalies   Assessment  Primary Diagnosis & Pertinent Problem List: The primary encounter diagnosis was Chronic pain syndrome. Diagnoses of Chronic neck pain (Location of Primary Source of Pain) (Bilateral) (L>R), Rheumatoid arthritis, seronegative, shoulder region (Bilateral) (L>R), Cervical central spinal stenosis C5-6 (Right), Cervical spondylosis, Myofascial pain, and Neurogenic pain were also pertinent to this visit.  Status Diagnosis  Persistent Unimproved Stable 1. Chronic pain syndrome   2. Chronic neck pain (Location of Primary Source of Pain) (Bilateral) (L>R)   3. Rheumatoid arthritis, seronegative, shoulder region (Bilateral) (L>R)   4. Cervical central spinal stenosis C5-6 (Right)   5. Cervical spondylosis   6. Myofascial pain   7. Neurogenic pain     Problems updated and reviewed during this visit: No problems updated. Plan of Care  Pharmacotherapy (Medications Ordered): No orders of the defined types were placed in this encounter.  New Prescriptions   No medications  on file   Medications administered today: Sarah Fisher had no medications administered during this visit. Lab-work, procedure(s), and/or referral(s): No orders of the defined types were placed in this encounter.  Imaging and/or referral(s): None  Interventional therapies: Planned, scheduled, and/or pending:   Not at this time.   Considering:   Diagnostic bilateral cervical facet block  Possible bilateral cervical facet RFA Diagnostic bilateral greater occipital nerve block Possible bilateral greater occipital RFA Diagnostic bilateral C2 +TON nerve block Possible bilateral C2 + TON RFA Possible bilateral occipital nerve stimulator trial Diagnostic bilateral lumbar facet block  Possible bilateral lumbar facet RFA Diagnostic bilateral intra-articular knee joint injection with local anesthetic and steroid  Possible bilateral series of 5 intra-articular Hyalgan knee injections  Diagnostic bilateral Genicular nerve block  Possible bilateral Genicular nerve RFA Diagnostic costochondral/xiphoid local anesthetic and steroid injections    Results:   Diagnostic left cervical epidural steroid injection: (10/21/16) No apparent benefit.   Palliative PRN treatment(s):   None at this time.    Provider-requested follow-up: Return in about 5 weeks (around 01/15/2017) for Med-Mgmt, w/ NP.  Future Appointments Date Time Provider [place]  03/17/2017 9:00 AM Vevelyn Francois, NP [facility] [address] None   Primary Care Physician: Ma Hillock, DO Location: [facility] Note by: Kathlen Brunswick. Dossie Arbour, M.D, DABA, DABAPM, DABPM, DABIPP, FIPP Date: 12/08/2016; Time: 5:16 PM  Patient instructions provided during this appointment: There are no Patient Instructions on file for this visit.

## 2017-01-20 ENCOUNTER — Ambulatory Visit: Payer: BLUE CROSS/BLUE SHIELD | Admitting: Pain Medicine

## 2017-02-13 ENCOUNTER — Other Ambulatory Visit: Payer: Self-pay

## 2017-02-13 MED ORDER — SIMVASTATIN 10 MG PO TABS: 10.0000 mg | ORAL_TABLET | Freq: Every day | ORAL | 0 refills | Status: DC

## 2017-03-17 ENCOUNTER — Encounter: Payer: BLUE CROSS/BLUE SHIELD | Admitting: Nurse Practitioner

## 2017-06-02 DIAGNOSIS — Z1231 Encounter for screening mammogram for malignant neoplasm of breast: Secondary | ICD-10-CM | POA: Diagnosis not present

## 2017-06-02 LAB — HM MAMMOGRAPHY

## 2017-06-03 ENCOUNTER — Encounter: Payer: Self-pay | Admitting: Family Medicine

## 2017-06-03 ENCOUNTER — Ambulatory Visit: Payer: BLUE CROSS/BLUE SHIELD | Admitting: Family Medicine

## 2017-06-03 VITALS — BP 118/76 | HR 63 | Temp 97.8°F | Resp 18 | Wt 143.8 lb

## 2017-06-03 DIAGNOSIS — M67449 Ganglion, unspecified hand: Secondary | ICD-10-CM

## 2017-06-03 DIAGNOSIS — Z23 Encounter for immunization: Secondary | ICD-10-CM

## 2017-06-03 DIAGNOSIS — I10 Essential (primary) hypertension: Secondary | ICD-10-CM

## 2017-06-03 MED ORDER — LOSARTAN POTASSIUM 50 MG PO TABS: 50.0000 mg | ORAL_TABLET | Freq: Every day | ORAL | 1 refills | Status: DC

## 2017-06-03 MED ORDER — SIMVASTATIN 10 MG PO TABS: 10.0000 mg | ORAL_TABLET | Freq: Every day | ORAL | 1 refills | Status: DC

## 2017-06-03 NOTE — Patient Instructions (Addendum)
I have refilled your hypertension medications today.  Blood pressure is normal.  Follow up 6 months (can  Be your physical if due  with fasting labs)  Referral placed to hand surgery for finger cyst and they will call to schedule you .    Please help [place] help you:  We are honored you have chosen [facility] for your Primary Care home. Below you will find basic instructions that you may need to access in the future. Please help [place] help you by reading the instructions, which cover many of the frequent questions we experience.   Prescription refills and request:  -In order to allow more efficient response time, please call your pharmacy for all refills. They will forward the request electronically to [place]. This allows for the quickest possible response. Request left on a nurse line can take longer to refill, since these are checked as time allows between office patients and other phone calls.  - refill request can take up to 3-5 working days to complete.  - If request is sent electronically and request is appropiate, it is usually completed in 1-2 business days.  - all patients will need to be seen routinely for all chronic medical conditions requiring prescription medications (see follow-up below). If you are overdue for follow up on your condition, you will be asked to make an appointment and we will call in enough medication to cover you until your appointment (up to 30 days).  - all controlled substances will require a face to face visit to request/refill.  - if you desire your prescriptions to go through a new pharmacy, and have an active script at original pharmacy, you will need to call your pharmacy and have scripts transferred to new pharmacy. This is completed between the pharmacy locations and not by your provider.    Results: If any images or labs were ordered, it can take up to 1 week to get results depending on the test ordered and the lab/facility running and resulting the test. -  Normal or stable results, which do not need further discussion, may be released to your mychart immediately with attached note to you. A call may not be generated for normal results. Please make certain to sign up for mychart. If you have questions on how to activate your mychart you can call the front office.  - If your results need further discussion, our office will attempt to contact you via phone, and if unable to reach you after 2 attempts, we will release your abnormal result to your mychart with instructions.  - All results will be automatically released in mychart after 1 week.  - Your provider will provide you with explanation and instruction on all relevant material in your results. Please keep in mind, results and labs may appear confusing or abnormal to the untrained eye, but it does not mean they are actually abnormal for you personally. If you have any questions about your results that are not covered, or you desire more detailed explanation than what was provided, you should make an appointment with your provider to do so.   Our office handles many outgoing and incoming calls daily. If we have not contacted you within 1 week about your results, please check your mychart to see if there is a message first and if not, then contact our office.  In helping with this matter, you help decrease call volume, and therefore allow [place] to be able to respond to patients needs more efficiently.  Acute office visits (sick visit):  An acute visit is intended for a new problem and are scheduled in shorter time slots to allow schedule openings for patients with new problems. This is the appropriate visit to discuss a new problem. In order to provide you with excellent quality medical care with proper time for you to explain your problem, have an exam and receive treatment with instructions, these appointments should be limited to one new problem per visit. If you experience a new problem, in which you desire  to be addressed, please make an acute office visit, we save openings on the schedule to accommodate you. Please do not save your new problem for any other type of visit, let us take care of it properly and quickly for you.   Follow up visits:  Depending on your condition(s) your provider will need to see you routinely in order to provide you with quality care and prescribe medication(s). Most chronic conditions (Example: hypertension, Diabetes, depression/anxiety... etc), require visits a couple times a year. Your provider will instruct you on proper follow up for your personal medical conditions and history. Please make certain to make follow up appointments for your condition as instructed. Failing to do so could result in lapse in your medication treatment/refills. If you request a refill, and are overdue to be seen on a condition, we will always provide you with a 30 day script (once) to allow you time to schedule.    Medicare wellness (well visit): - we have a wonderful Nurse Maudie Mercury), that will meet with you and provide you will yearly medicare wellness visits. These visits should occur yearly (can not be scheduled less than 1 calendar year apart) and cover preventive health, immunizations, advance directives and screenings you are entitled to yearly through your medicare benefits. Do not miss out on your entitled benefits, this is when medicare will pay for these benefits to be ordered for you.  These are strongly encouraged by your provider and is the appropriate type of visit to make certain you are up to date with all preventive health benefits. If you have not had your medicare wellness exam in the last 12 months, please make certain to schedule one by calling the office and schedule your medicare wellness with Maudie Mercury as soon as possible.   Yearly physical (well visit):  - Adults are recommended to be seen yearly for physicals. Check with your insurance and date of your last physical, most insurances  require one calendar year between physicals. Physicals include all preventive health topics, screenings, medical exam and labs that are appropriate for gender/age and history. You may have fasting labs needed at this visit. This is a well visit (not a sick visit), new problems should not be covered during this visit (see acute visit).  - Pediatric patients are seen more frequently when they are younger. Your provider will advise you on well child visit timing that is appropriate for your their age. - This is not a medicare wellness visit. Medicare wellness exams do not have an exam portion to the visit. Some medicare companies allow for a physical, some do not allow a yearly physical. If your medicare allows a yearly physical you can schedule the medicare wellness with our nurse Maudie Mercury and have your physical with your provider after, on the same day. Please check with insurance for your full benefits.   Late Policy/No Shows:  - all new patients should arrive 15-30 minutes earlier than appointment to allow [place] time  to  obtain all personal demographics,  insurance information and for you to complete office paperwork. - All established patients should arrive 10-15 minutes earlier than appointment time to update all information and be checked in .  - In our best efforts to run on time, if you are late for your appointment you will be asked to either reschedule or if able, we will work you back into the schedule. There will be a wait time to work you back in the schedule,  depending on availability.  - If you are unable to make it to your appointment as scheduled, please call 24 hours ahead of time to allow [place] to fill the time slot with someone else who needs to be seen. If you do not cancel your appointment ahead of time, you may be charged a no show fee.

## 2017-06-03 NOTE — Progress Notes (Signed)
Sarah Fisher , 1961/06/02, 56 y.o., female MRN: [id number] Patient Care Team    Relationship Specialty Notifications Start End  Ma Hillock, DO PCP - General Family Medicine  04/14/16     Chief Complaint  Patient presents with  . finger problem    middle left     Subjective: Pt presents for an OV with complaints of middle finger cyst with discomfort. The cyst has been present for a few years. It has become progressivly larger and more uncomfortable. It has started to cause a nail defect from the pressure on proximal nailbed. She has two other similar cysts removed in the past from other fingers and would like a referral to get this removed.  Hypertension/hyperlipidemia:  Pt reports compliance with Losartan 50 mg QD. Blood pressures ranges at home WNL. Patient denies chest pain, shortness of breath, dizziness or lower extremity edema. Pt is  prescribed statin. BMP: UTD 09/2016 CBC: UTD 09/2016 Lipid: 05/15/2016 Diet: low sodium Exercise: Not able to do exercises 2/2 chronic pain RF: HLD, HTN  Depression screen [facility] 2/9 12/08/2016 11/05/2016 10/21/2016 10/01/2016 05/15/2016  Decreased Interest 0 0 0 0 0  Down, Depressed, Hopeless 0 0 0 0 0  PHQ - 2 Score 0 0 0 0 0    No Known Allergies Social History   Tobacco Use  . Smoking status: Never Smoker  . Smokeless tobacco: Never Used  Substance Use Topics  . Alcohol use: No    Comment: a glass of wine occasionally   Past Medical History:  Diagnosis Date  . Anemia   . Arthritis   . Cervical spine degeneration 2010   C5-6  . History of carpal tunnel syndrome   . Hyperlipidemia   . Hypertension   . Sacroiliac joint pain 2010   right  . Scoliosis   . Skin cancer of eyelid    Past Surgical History:  Procedure Laterality Date  . BREAST SURGERY  1986   reduction  . cervical facet block  2010  . HAND SURGERY Bilateral 2000 and 2016   benign tumor removal   . WISDOM TOOTH EXTRACTION  1979   Family History  Problem  Relation Age of Onset  . Hypertension Mother   . Arthritis Mother   . Hypertension Father   . Liver cancer Father   . Hypertension Sister   . Arthritis Maternal Aunt   . Hypertension Maternal Grandmother   . Kidney cancer Maternal Grandmother   . Alzheimer's disease Maternal Grandfather   . Colon cancer Maternal Grandfather 57  . Diabetes Maternal Grandfather   . Alzheimer's disease Paternal Grandmother   . Esophageal cancer Neg Hx   . Rectal cancer Neg Hx   . Stomach cancer Neg Hx    Allergies as of 06/03/2017   No Known Allergies     Medication List        Accurate as of 06/03/17 10:58 AM. Always use your most recent med list.          gabapentin 100 MG capsule Commonly known as:  NEURONTIN Take 1-3 capsules (100-300 mg total) by mouth every 8 (eight) hours.   Green Tea 315 MG Caps Take by mouth daily.   ibuprofen 200 MG tablet Commonly known as:  ADVIL,MOTRIN Take 400 mg by mouth every 4 (four) hours as needed.   losartan 50 MG tablet Commonly known as:  COZAAR Take 1 tablet (50 mg total) by mouth daily.   multivitamin tablet Take 1 tablet by  mouth daily. With vit D   Omega 3 1000 MG Caps Take 1 capsule by mouth daily.   simvastatin 10 MG tablet Commonly known as:  ZOCOR Take 1 tablet (10 mg total) by mouth daily.   tizanidine 2 MG capsule Commonly known as:  ZANAFLEX Take 1 capsule (2 mg total) by mouth 3 (three) times daily as needed for muscle spasms.   traMADol 50 MG tablet Commonly known as:  ULTRAM Take 1 tablet (50 mg total) by mouth every 6 (six) hours as needed for severe pain.       All past medical history, surgical history, allergies, family history, immunizations andmedications were updated in the EMR today and reviewed under the history and medication portions of their EMR.     ROS: Negative, with the exception of above mentioned in HPI   Objective:  BP 118/76 (BP Location: Left Arm, Patient Position: Sitting, Cuff Size: Normal)    Pulse 63   Temp 97.8 F (36.6 C)   Resp 18   Wt 143 lb 12 oz (65.2 kg)   SpO2 98%   BMI 28.07 kg/m  Body mass index is 28.07 kg/m. Gen: Afebrile. No acute distress. Nontoxic in appearance, well developed, well nourished.  HENT: AT. [place].  MMM Eyes:Pupils Equal Round Reactive to light, Extraocular movements intact,  Conjunctiva without redness, discharge or icterus. CV: RRR no murmur, no edema Chest: CTAB, no wheeze or crackles. Good air movement, normal resp effort.  Abd: Soft. NTND. BS present. no Masses palpated. No rebound or guarding.  MSK: left middle finger proximal joint cyst ~62mm, fluid filled. Discomfort with finger flexion.  Skin: no rashes, purpura or petechiae.  Neuro: Normal gait. PERLA. EOMi. Alert. Oriented x3    No exam data present No results found. No results found for this or any previous visit (from the past 24 hour(s)).  Assessment/Plan: YIDES SAIDI is a 56 y.o. female present for OV for  Influenza vaccine administered - Flu Vaccine QUAD 6+ mos PF IM (Fluarix Quad PF)  Essential hypertension - stable.  - refills provided on losartan 50 mg QD.  - low salt diet, exercise.  - f/u 6 months  Ganglion cyst of finger - new - cyst has become more painful as it has grown. Nailbed deformity from pressure.  Pt desires removal. Referred to Hand surgery.  - Ambulatory referral to Hand Surgery   Reviewed expectations re: course of current medical issues.  Discussed self-management of symptoms.  Outlined signs and symptoms indicating need for more acute intervention.  Patient verbalized understanding and all questions were answered.  Patient received an After-Visit Summary.    Orders Placed This Encounter  Procedures  . Flu Vaccine QUAD 6+ mos PF IM (Fluarix Quad PF)     Note is dictated utilizing voice recognition software. Although note has been proof read prior to signing, occasional typographical errors still can be missed. If any questions  arise, please do not hesitate to call for verification.   electronically signed by:  Howard Pouch, DO  [place]

## 2017-06-05 ENCOUNTER — Encounter: Payer: Self-pay | Admitting: *Deleted

## 2017-06-08 ENCOUNTER — Telehealth: Payer: Self-pay | Admitting: Family Medicine

## 2017-06-08 NOTE — Telephone Encounter (Signed)
Please inform pt her mammogram was normal.

## 2017-06-08 NOTE — Telephone Encounter (Signed)
Detailed Message left on voice mail, okay per DPR.

## 2017-06-09 DIAGNOSIS — M6740 Ganglion, unspecified site: Secondary | ICD-10-CM | POA: Diagnosis not present

## 2017-06-09 DIAGNOSIS — M79641 Pain in right hand: Secondary | ICD-10-CM | POA: Diagnosis not present

## 2017-06-09 DIAGNOSIS — M542 Cervicalgia: Secondary | ICD-10-CM | POA: Diagnosis not present

## 2017-06-09 DIAGNOSIS — M19042 Primary osteoarthritis, left hand: Secondary | ICD-10-CM | POA: Diagnosis not present

## 2017-06-09 DIAGNOSIS — G5603 Carpal tunnel syndrome, bilateral upper limbs: Secondary | ICD-10-CM | POA: Diagnosis not present

## 2017-06-09 DIAGNOSIS — M79642 Pain in left hand: Secondary | ICD-10-CM | POA: Diagnosis not present

## 2017-06-09 DIAGNOSIS — M67442 Ganglion, left hand: Secondary | ICD-10-CM | POA: Diagnosis not present

## 2017-06-10 ENCOUNTER — Encounter: Payer: Self-pay | Admitting: Family Medicine

## 2017-06-10 ENCOUNTER — Ambulatory Visit (INDEPENDENT_AMBULATORY_CARE_PROVIDER_SITE_OTHER): Payer: BLUE CROSS/BLUE SHIELD | Admitting: Family Medicine

## 2017-06-10 VITALS — BP 111/79 | HR 69 | Temp 98.0°F | Resp 18 | Ht 60.0 in | Wt 140.0 lb

## 2017-06-10 DIAGNOSIS — E785 Hyperlipidemia, unspecified: Secondary | ICD-10-CM | POA: Diagnosis not present

## 2017-06-10 DIAGNOSIS — I10 Essential (primary) hypertension: Secondary | ICD-10-CM

## 2017-06-10 DIAGNOSIS — Z0000 Encounter for general adult medical examination without abnormal findings: Secondary | ICD-10-CM

## 2017-06-10 DIAGNOSIS — E663 Overweight: Secondary | ICD-10-CM | POA: Diagnosis not present

## 2017-06-10 DIAGNOSIS — E559 Vitamin D deficiency, unspecified: Secondary | ICD-10-CM

## 2017-06-10 LAB — CBC WITH DIFFERENTIAL/PLATELET
BASOS PCT: 0.8 % (ref 0.0–3.0)
Basophils Absolute: 0 10*3/uL (ref 0.0–0.1)
EOS ABS: 0.1 10*3/uL (ref 0.0–0.7)
Eosinophils Relative: 2 % (ref 0.0–5.0)
HCT: 38.8 % (ref 36.0–46.0)
HEMOGLOBIN: 12.7 g/dL (ref 12.0–15.0)
LYMPHS ABS: 1.5 10*3/uL (ref 0.7–4.0)
Lymphocytes Relative: 26.8 % (ref 12.0–46.0)
MCHC: 32.8 g/dL (ref 30.0–36.0)
MCV: 92.6 fl (ref 78.0–100.0)
MONO ABS: 0.5 10*3/uL (ref 0.1–1.0)
Monocytes Relative: 8.2 % (ref 3.0–12.0)
NEUTROS PCT: 62.2 % (ref 43.0–77.0)
Neutro Abs: 3.5 10*3/uL (ref 1.4–7.7)
PLATELETS: 322 10*3/uL (ref 150.0–400.0)
RBC: 4.19 Mil/uL (ref 3.87–5.11)
RDW: 12.6 % (ref 11.5–15.5)
WBC: 5.6 10*3/uL (ref 4.0–10.5)

## 2017-06-10 LAB — COMPREHENSIVE METABOLIC PANEL
ALT: 16 U/L (ref 0–35)
AST: 17 U/L (ref 0–37)
Albumin: 4.7 g/dL (ref 3.5–5.2)
Alkaline Phosphatase: 63 U/L (ref 39–117)
BUN: 18 mg/dL (ref 6–23)
CHLORIDE: 103 meq/L (ref 96–112)
CO2: 30 meq/L (ref 19–32)
CREATININE: 0.67 mg/dL (ref 0.40–1.20)
Calcium: 9.6 mg/dL (ref 8.4–10.5)
GFR: 96.52 mL/min (ref >60.00)
GLUCOSE: 89 mg/dL (ref 70–99)
Potassium: 4.2 mEq/L (ref 3.5–5.1)
SODIUM: 142 meq/L (ref 135–145)
Total Bilirubin: 0.5 mg/dL (ref 0.2–1.2)
Total Protein: 7.1 g/dL (ref 6.0–8.3)

## 2017-06-10 LAB — LIPID PANEL
CHOL/HDL RATIO: 3
Cholesterol: 205 mg/dL — ABNORMAL HIGH (ref 0–200)
HDL: 68.3 mg/dL (ref >39.00)
LDL CALC: 107 mg/dL — AB (ref 0–99)
NonHDL: 137.09
Triglycerides: 150 mg/dL — ABNORMAL HIGH (ref 0.0–149.0)
VLDL: 30 mg/dL (ref 0.0–40.0)

## 2017-06-10 LAB — VITAMIN D 25 HYDROXY (VIT D DEFICIENCY, FRACTURES): VITD: 27.56 ng/mL — AB (ref 30.00–100.00)

## 2017-06-10 LAB — HEMOGLOBIN A1C: Hgb A1c MFr Bld: 5.4 % (ref 4.6–6.5)

## 2017-06-10 LAB — TSH: TSH: 2.03 u[IU]/mL (ref 0.35–4.50)

## 2017-06-10 NOTE — Progress Notes (Signed)
Patient ID: Sarah Fisher, female  DOB: Mar 25, 1961, 56 y.o.   MRN: [id number] Patient Care Team    Relationship Specialty Notifications Start End  Ma Hillock, DO PCP - General Family Medicine  04/14/16     Subjective:  Sarah Fisher is a 56 y.o.  female present for CPE All past medical history, surgical history, allergies, family history, immunizations, medications and social history were updated and entered  in the electronic medical record today. All recent labs, ED visits and hospitalizations within the last year were reviewed.  She has now seen ortho for her finger cyst and carpal tunnel.   Health maintenance:  Colonoscopy: Never, fhx present.  Mammogram: completed: 05/2017, birads 1; normal. SOlLIS.  Cervical cancer screening: last pap: 2016, results: normal per pt. F/U in 2019 Immunizations: tdap 12/2014 UTD, Influenza 2018 UTD (encouraged yearly) Infectious disease screening: HIV and HEP c completed  2017 DEXA: never Last eye exam: 2013 Last foot exam: 2015   Immunization History  Administered Date(s) Administered  . Influenza,inj,Quad PF,6+ Mos 04/14/2016, 06/03/2017  . Tdap 12/06/2014    Past Medical History:  Diagnosis Date  . Anemia   . Arthritis   . Cervical spine degeneration 2010   C5-6  . History of carpal tunnel syndrome   . Hyperlipidemia   . Hypertension   . Sacroiliac joint pain 2010   right  . Scoliosis   . Skin cancer of eyelid    No Known Allergies Past Surgical History:  Procedure Laterality Date  . BREAST SURGERY  1986   reduction  . cervical facet block  2010  . HAND SURGERY Bilateral 2000 and 2016   benign tumor removal   . WISDOM TOOTH EXTRACTION  1979   Family History  Problem Relation Age of Onset  . Hypertension Mother   . Arthritis Mother   . Hypertension Father   . Liver cancer Father   . Hypertension Sister   . Arthritis Maternal Aunt   . Hypertension Maternal Grandmother   . Kidney cancer Maternal  Grandmother   . Alzheimer's disease Maternal Grandfather   . Colon cancer Maternal Grandfather 39  . Diabetes Maternal Grandfather   . Alzheimer's disease Paternal Grandmother   . Esophageal cancer Neg Hx   . Rectal cancer Neg Hx   . Stomach cancer Neg Hx    Social History   Socioeconomic History  . Marital status: Divorced    Spouse name: Not on file  . Number of children: 3  . Years of education: 64  . Highest education level: Not on file  Social Needs  . Financial resource strain: Not on file  . Food insecurity - worry: Not on file  . Food insecurity - inability: Not on file  . Transportation needs - medical: Not on file  . Transportation needs - non-medical: Not on file  Occupational History  . Occupation: housewife  Tobacco Use  . Smoking status: Never Smoker  . Smokeless tobacco: Never Used  Substance and Sexual Activity  . Alcohol use: No    Comment: a glass of wine occasionally  . Drug use: No  . Sexual activity: Yes    Partners: Male    Birth control/protection: None  Other Topics Concern  . Not on file  Social History Narrative   Divorced. 3 children, one lives with [name]).   She is from [place], moved in 2016.    Housewife. B.A. Degree.    Drinks caffeine, uses  herbal remedies. Takes a daily vitamin.    Wears her seatbelt, bicycle helmet.Smoke detector in the home.    Exercises routinely.    Feels safe in her relationships.    Allergies as of 06/10/2017   No Known Allergies     Medication List        Accurate as of 06/10/17 10:08 AM. Always use your most recent med list.          gabapentin 100 MG capsule Commonly known as:  NEURONTIN Take 1-3 capsules (100-300 mg total) by mouth every 8 (eight) hours.   Ginkgo Biloba 40 MG Tabs Take by mouth.   GLUCOS-CHONDROIT-CA-MG-C-D PO Take by mouth daily.   Green Tea 315 MG Caps Take by mouth daily.   Green Tea-Hoodia 315-12.5 MG Caps Take by mouth.   ibuprofen 200 MG tablet Commonly  known as:  ADVIL,MOTRIN Take 400 mg by mouth every 4 (four) hours as needed.   losartan 50 MG tablet Commonly known as:  COZAAR Take 1 tablet (50 mg total) by mouth daily.   multivitamin tablet Take 1 tablet by mouth daily. With vit D   Omega 3 1000 MG Caps Take 1 capsule by mouth daily.   simvastatin 10 MG tablet Commonly known as:  ZOCOR Take 1 tablet (10 mg total) by mouth daily.   tizanidine 2 MG capsule Commonly known as:  ZANAFLEX Take 1 capsule (2 mg total) by mouth 3 (three) times daily as needed for muscle spasms.   traMADol 50 MG tablet Commonly known as:  ULTRAM Take 1 tablet (50 mg total) by mouth every 6 (six) hours as needed for severe pain.        Recent Results (from the past 2160 hour(s))  HM MAMMOGRAPHY     Status: None   Collection Time: 06/02/17 12:00 AM  Result Value Ref Range   HM Mammogram 0-4 Bi-Rad 0-4 Bi-Rad, Self Reported Normal    Comment: normal    No results found.   ROS: 14 pt review of systems performed and negative (unless mentioned in an HPI)  Objective: BP 111/79 (BP Location: Right Arm, Patient Position: Sitting, Cuff Size: Normal)   Pulse 69   Temp 98 F (36.7 C)   Resp 18   Ht 5' (1.524 m)   Wt 140 lb (63.5 kg)   SpO2 99%   BMI 27.34 kg/m  Gen: Afebrile. No acute distress. Nontoxic in appearance, well developed well nourished HENT: AT. [place]. Bilateral TM visualized and normal in appearance. MMM. Bilateral nares without erythema or drainage. Throat without erythema or exudates. No cough or hoarseness Eyes:Pupils Equal Round Reactive to light, Extraocular movements intact,  Conjunctiva without redness, discharge or icterus. Neck/lymp/endocrine: Supple,no lymphadenopathy, no thyromegaly CV: RRR no murmur, no edema, +2/4 P posterior tibialis pulses Chest: CTAB, no wheeze or crackles Abd: Soft. flat. NTND. BS present. no Masses palpated.  Skin: no rashes, purpura or petechiae.  Neuro/MSK:  Normal gait. PERLA. EOMi. Alert.  Oriented. Cranial nerves II through XII intact. Muscle strength equal bilateral  extremities. DTRs equal bilaterally. Psych: Normal affect, dress and demeanor. Normal speech. Normal thought content and judgment.   Assessment/plan: Sarah Fisher is a 56 y.o. female present for CPE.  Hyperlipidemia, unspecified hyperlipidemia type/HTN/overweight - stable. Continue losartan.  Continue statin and fish oil  - CBC w/Diff - Comp Met (CMET) - Lipid panel - HgB A1c - TSH Vitamin D deficiency - currently not taking vit d - Vitamin D (25 hydroxy) Encounter for preventive  health examination Patient was encouraged to exercise greater than 150 minutes a week. Patient was encouraged to choose a diet filled with fresh fruits and vegetables, and lean meats. AVS provided to patient today for education/recommendation on gender specific health and safety maintenance. PAP due next year with CPR Repeat colonoscopy due 2023 ID screenings complete Tdap and influenza UTD   Return in about 1 year (around 06/10/2018) for CPE w/ PAP. with fasting labs.  Electronically signed by: Howard Pouch, DO [place]

## 2017-06-10 NOTE — Patient Instructions (Signed)

## 2017-06-24 DIAGNOSIS — M67442 Ganglion, left hand: Secondary | ICD-10-CM | POA: Diagnosis not present

## 2017-06-24 DIAGNOSIS — G5601 Carpal tunnel syndrome, right upper limb: Secondary | ICD-10-CM | POA: Diagnosis not present

## 2017-06-24 DIAGNOSIS — G5603 Carpal tunnel syndrome, bilateral upper limbs: Secondary | ICD-10-CM | POA: Diagnosis not present

## 2017-06-24 DIAGNOSIS — M19042 Primary osteoarthritis, left hand: Secondary | ICD-10-CM | POA: Diagnosis not present

## 2017-06-24 DIAGNOSIS — M4712 Other spondylosis with myelopathy, cervical region: Secondary | ICD-10-CM | POA: Diagnosis not present

## 2017-06-24 DIAGNOSIS — M542 Cervicalgia: Secondary | ICD-10-CM | POA: Diagnosis not present

## 2017-06-26 ENCOUNTER — Other Ambulatory Visit: Payer: Self-pay | Admitting: Orthopedic Surgery

## 2017-06-26 DIAGNOSIS — M542 Cervicalgia: Secondary | ICD-10-CM

## 2017-07-06 ENCOUNTER — Ambulatory Visit (INDEPENDENT_AMBULATORY_CARE_PROVIDER_SITE_OTHER): Payer: BLUE CROSS/BLUE SHIELD

## 2017-07-06 DIAGNOSIS — M47892 Other spondylosis, cervical region: Secondary | ICD-10-CM | POA: Diagnosis not present

## 2017-07-06 DIAGNOSIS — M4802 Spinal stenosis, cervical region: Secondary | ICD-10-CM | POA: Diagnosis not present

## 2017-07-06 DIAGNOSIS — M542 Cervicalgia: Secondary | ICD-10-CM

## 2017-07-08 ENCOUNTER — Other Ambulatory Visit: Payer: Self-pay | Admitting: Orthopedic Surgery

## 2017-08-03 ENCOUNTER — Encounter (HOSPITAL_BASED_OUTPATIENT_CLINIC_OR_DEPARTMENT_OTHER): Payer: Self-pay | Admitting: *Deleted

## 2017-08-03 ENCOUNTER — Other Ambulatory Visit: Payer: Self-pay

## 2017-08-07 HISTORY — PX: OTHER SURGICAL HISTORY: SHX169

## 2017-08-10 ENCOUNTER — Ambulatory Visit (HOSPITAL_BASED_OUTPATIENT_CLINIC_OR_DEPARTMENT_OTHER): Payer: BLUE CROSS/BLUE SHIELD | Admitting: Anesthesiology

## 2017-08-10 ENCOUNTER — Encounter (HOSPITAL_BASED_OUTPATIENT_CLINIC_OR_DEPARTMENT_OTHER): Payer: Self-pay | Admitting: Emergency Medicine

## 2017-08-10 ENCOUNTER — Other Ambulatory Visit: Payer: Self-pay

## 2017-08-10 ENCOUNTER — Ambulatory Visit (HOSPITAL_BASED_OUTPATIENT_CLINIC_OR_DEPARTMENT_OTHER)
Admission: RE | Admit: 2017-08-10 | Discharge: 2017-08-10 | Disposition: A | Discharge status: Home / Self Care | Payer: BLUE CROSS/BLUE SHIELD | Source: Ambulatory Visit | Attending: Orthopedic Surgery | Admitting: Orthopedic Surgery

## 2017-08-10 ENCOUNTER — Encounter (HOSPITAL_BASED_OUTPATIENT_CLINIC_OR_DEPARTMENT_OTHER)
Admission: RE | Disposition: A | Discharge status: Home / Self Care | Payer: Self-pay | Source: Ambulatory Visit | Attending: Orthopedic Surgery

## 2017-08-10 DIAGNOSIS — E785 Hyperlipidemia, unspecified: Secondary | ICD-10-CM | POA: Diagnosis not present

## 2017-08-10 DIAGNOSIS — M67442 Ganglion, left hand: Secondary | ICD-10-CM | POA: Insufficient documentation

## 2017-08-10 DIAGNOSIS — Z85828 Personal history of other malignant neoplasm of skin: Secondary | ICD-10-CM | POA: Diagnosis not present

## 2017-08-10 DIAGNOSIS — M71342 Other bursal cyst, left hand: Secondary | ICD-10-CM | POA: Diagnosis not present

## 2017-08-10 DIAGNOSIS — M25741 Osteophyte, right hand: Secondary | ICD-10-CM | POA: Diagnosis not present

## 2017-08-10 DIAGNOSIS — M19042 Primary osteoarthritis, left hand: Secondary | ICD-10-CM | POA: Insufficient documentation

## 2017-08-10 DIAGNOSIS — I10 Essential (primary) hypertension: Secondary | ICD-10-CM | POA: Insufficient documentation

## 2017-08-10 DIAGNOSIS — M25742 Osteophyte, left hand: Secondary | ICD-10-CM | POA: Diagnosis not present

## 2017-08-10 DIAGNOSIS — Z79899 Other long term (current) drug therapy: Secondary | ICD-10-CM | POA: Insufficient documentation

## 2017-08-10 HISTORY — PX: I & D EXTREMITY: SHX5045

## 2017-08-10 HISTORY — PX: MASS EXCISION: SHX2000

## 2017-08-10 SURGERY — EXCISION MASS
Anesthesia: Monitor Anesthesia Care | Laterality: Left

## 2017-08-10 MED ORDER — HYDROCODONE-ACETAMINOPHEN 5-325 MG PO TABS: ORAL_TABLET | ORAL | 0 refills | Status: DC

## 2017-08-10 MED ORDER — CHLORHEXIDINE GLUCONATE 4 % EX LIQD: 60.0000 mL | Freq: Once | CUTANEOUS | Status: DC

## 2017-08-10 MED ORDER — PROPOFOL 10 MG/ML IV BOLUS
INTRAVENOUS | Status: DC | PRN
  Administered 2017-08-10 (×2): 20 mg via INTRAVENOUS
  Administered 2017-08-10: 40 mg via INTRAVENOUS

## 2017-08-10 MED ORDER — CEFAZOLIN SODIUM-DEXTROSE 2-4 GM/100ML-% IV SOLN
INTRAVENOUS | Status: AC
  Filled 2017-08-10: qty 100

## 2017-08-10 MED ORDER — ONDANSETRON HCL 4 MG/2ML IJ SOLN
INTRAMUSCULAR | Status: DC | PRN
  Administered 2017-08-10: 4 mg via INTRAVENOUS

## 2017-08-10 MED ORDER — CEFAZOLIN SODIUM-DEXTROSE 2-4 GM/100ML-% IV SOLN
2.0000 g | INTRAVENOUS | Status: AC
  Administered 2017-08-10: 2 g via INTRAVENOUS

## 2017-08-10 MED ORDER — LIDOCAINE HCL (PF) 0.5 % IJ SOLN
INTRAMUSCULAR | Status: DC | PRN
  Administered 2017-08-10: 30 mL via INTRAVENOUS

## 2017-08-10 MED ORDER — FENTANYL CITRATE (PF) 100 MCG/2ML IJ SOLN
INTRAMUSCULAR | Status: AC
  Filled 2017-08-10: qty 2

## 2017-08-10 MED ORDER — LACTATED RINGERS IV SOLN
INTRAVENOUS | Status: DC
  Administered 2017-08-10 (×2): via INTRAVENOUS

## 2017-08-10 MED ORDER — MIDAZOLAM HCL 2 MG/2ML IJ SOLN
INTRAMUSCULAR | Status: AC
  Filled 2017-08-10: qty 2

## 2017-08-10 MED ORDER — FENTANYL CITRATE (PF) 100 MCG/2ML IJ SOLN
50.0000 ug | INTRAMUSCULAR | Status: DC | PRN
  Administered 2017-08-10 (×2): 50 ug via INTRAVENOUS

## 2017-08-10 MED ORDER — SCOPOLAMINE 1 MG/3DAYS TD PT72: 1.0000 | MEDICATED_PATCH | Freq: Once | TRANSDERMAL | Status: DC | PRN

## 2017-08-10 MED ORDER — BUPIVACAINE HCL (PF) 0.25 % IJ SOLN
INTRAMUSCULAR | Status: AC
  Filled 2017-08-10: qty 30

## 2017-08-10 MED ORDER — BUPIVACAINE HCL (PF) 0.25 % IJ SOLN
INTRAMUSCULAR | Status: DC | PRN
  Administered 2017-08-10: 9 mL

## 2017-08-10 MED ORDER — MIDAZOLAM HCL 2 MG/2ML IJ SOLN
1.0000 mg | INTRAMUSCULAR | Status: DC | PRN
  Administered 2017-08-10: 2 mg via INTRAVENOUS

## 2017-08-10 SURGICAL SUPPLY — 60 items
BAG DECANTER FOR FLEXI CONT (MISCELLANEOUS) IMPLANT
BANDAGE ACE 3X5.8 VEL STRL LF (GAUZE/BANDAGES/DRESSINGS) IMPLANT
BANDAGE COBAN STERILE 2 (GAUZE/BANDAGES/DRESSINGS) IMPLANT
BENZOIN TINCTURE PRP APPL 2/3 (GAUZE/BANDAGES/DRESSINGS) IMPLANT
BLADE MINI RND TIP GREEN BEAV (BLADE) IMPLANT
BLADE SURG 15 STRL LF DISP TIS (BLADE) ×2 IMPLANT
BLADE SURG 15 STRL SS (BLADE) ×2
BNDG COHESIVE 1X5 TAN STRL LF (GAUZE/BANDAGES/DRESSINGS) ×2 IMPLANT
BNDG CONFORM 2 STRL LF (GAUZE/BANDAGES/DRESSINGS) IMPLANT
BNDG ELASTIC 2X5.8 VLCR STR LF (GAUZE/BANDAGES/DRESSINGS) IMPLANT
BNDG ESMARK 4X9 LF (GAUZE/BANDAGES/DRESSINGS) ×2 IMPLANT
BNDG GAUZE 1X2.1 STRL (MISCELLANEOUS) IMPLANT
BNDG GAUZE ELAST 4 BULKY (GAUZE/BANDAGES/DRESSINGS) IMPLANT
BNDG PLASTER X FAST 3X3 WHT LF (CAST SUPPLIES) IMPLANT
BRUSH SCRUB EZ PLAIN DRY (MISCELLANEOUS) ×2 IMPLANT
CHLORAPREP W/TINT 26ML (MISCELLANEOUS) ×2 IMPLANT
CORD BIPOLAR FORCEPS 12FT (ELECTRODE) ×2 IMPLANT
COVER BACK TABLE 60X90IN (DRAPES) ×2 IMPLANT
COVER MAYO STAND STRL (DRAPES) ×2 IMPLANT
CUFF TOURNIQUET SINGLE 18IN (TOURNIQUET CUFF) ×2 IMPLANT
DRAPE EXTREMITY T 121X128X90 (DRAPE) ×2 IMPLANT
DRAPE SURG 17X23 STRL (DRAPES) ×2 IMPLANT
GAUZE PACKING IODOFORM 1/4X15 (GAUZE/BANDAGES/DRESSINGS) IMPLANT
GAUZE SPONGE 4X4 12PLY STRL (GAUZE/BANDAGES/DRESSINGS) ×2 IMPLANT
GAUZE XEROFORM 1X8 LF (GAUZE/BANDAGES/DRESSINGS) ×2 IMPLANT
GLOVE BIO SURGEON STRL SZ7.5 (GLOVE) ×2 IMPLANT
GLOVE BIOGEL PI IND STRL 8 (GLOVE) ×2 IMPLANT
GLOVE BIOGEL PI INDICATOR 8 (GLOVE) ×2
GOWN STRL REUS W/ TWL LRG LVL3 (GOWN DISPOSABLE) ×1 IMPLANT
GOWN STRL REUS W/TWL LRG LVL3 (GOWN DISPOSABLE) ×1
GOWN STRL REUS W/TWL XL LVL3 (GOWN DISPOSABLE) ×2 IMPLANT
LOOP VESSEL MAXI BLUE (MISCELLANEOUS) IMPLANT
NEEDLE BLD DRAW 23GX1   MC LAB (NEEDLE) ×1
NEEDLE BLD DRAW 23GX1  MC LAB (NEEDLE) ×1 IMPLANT
NEEDLE HYPO 25X1 1.5 SAFETY (NEEDLE) ×2 IMPLANT
NS IRRIG 1000ML POUR BTL (IV SOLUTION) ×2 IMPLANT
PACK BASIN DAY SURGERY FS (CUSTOM PROCEDURE TRAY) ×2 IMPLANT
PAD CAST 3X4 CTTN HI CHSV (CAST SUPPLIES) IMPLANT
PAD CAST 4YDX4 CTTN HI CHSV (CAST SUPPLIES) IMPLANT
PADDING CAST ABS 4INX4YD NS (CAST SUPPLIES) ×1
PADDING CAST ABS COTTON 4X4 ST (CAST SUPPLIES) ×1 IMPLANT
PADDING CAST COTTON 3X4 STRL (CAST SUPPLIES)
PADDING CAST COTTON 4X4 STRL (CAST SUPPLIES)
SPLINT PLASTER CAST XFAST 3X15 (CAST SUPPLIES) IMPLANT
SPLINT PLASTER XTRA FASTSET 3X (CAST SUPPLIES)
STOCKINETTE 4X48 STRL (DRAPES) ×2 IMPLANT
STRIP CLOSURE SKIN 1/2X4 (GAUZE/BANDAGES/DRESSINGS) IMPLANT
SUT ETHILON 3 0 PS 1 (SUTURE) IMPLANT
SUT ETHILON 4 0 PS 2 18 (SUTURE) ×2 IMPLANT
SUT ETHILON 5 0 P 3 18 (SUTURE)
SUT NYLON ETHILON 5-0 P-3 1X18 (SUTURE) IMPLANT
SUT VIC AB 4-0 P2 18 (SUTURE) IMPLANT
SWAB COLLECTION DEVICE MRSA (MISCELLANEOUS) IMPLANT
SWAB CULTURE ESWAB REG 1ML (MISCELLANEOUS) IMPLANT
SYR BULB 3OZ (MISCELLANEOUS) ×2 IMPLANT
SYR CONTROL 10ML LL (SYRINGE) ×2 IMPLANT
TOWEL OR 17X24 6PK STRL BLUE (TOWEL DISPOSABLE) ×4 IMPLANT
TRAY DSU PREP LF (CUSTOM PROCEDURE TRAY) ×2 IMPLANT
TUBE FEEDING ENTERAL 5FR 16IN (TUBING) IMPLANT
UNDERPAD 30X30 (UNDERPADS AND DIAPERS) ×2 IMPLANT

## 2017-08-10 NOTE — Op Note (Signed)
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  PROCEDURE:   1. Right long finger excision of mucoid cyst  2. Right long finger debridement DIP joint including distal phalanx osteophyte  3. Right long finger rotation flap for skin coverage less than 10 sq cm.  SURGEON:  Leanora Cover, MD.  ASSISTANT:  None.  ANESTHESIA:  Bier block with sedation.  IV FLUIDS:  Per anesthesia flow sheet.  ESTIMATED BLOOD LOSS:  Minimal.  COMPLICATIONS:  None.  SPECIMENS:  None.  TOURNIQUET TIME:  33 minutes.  DISPOSITION:  Stable to PACU.  INDICATIONS:  Sarah Fisher is a 57 year old female, who has noted a mass on the left long finger.  This is bothersome to her.  She wished to have it excised.  Risks, benefits, and alternatives of surgery were discussed including the risk of blood loss; infection; damage to nerves, vessels, tendons, ligaments, bone; failure for surgery; need for additional surgery; complications with wound healing; continued pain; recurrence of mass.  We also discussed potential need for rotation skin flap for coverage of the cyst appears very thin.  OPERATIVE COURSE:  After being identified preoperatively by myself, the patient and I agreed upon procedure and site of procedure.  Surgical site was marked.  Risks, benefits, and alternatives of surgery were reviewed and she wished to proceed.  Surgical consent had been signed. She was given IV Ancef as preoperative antibiotic prophylaxis.  She was transferred to the  operating room and placed on the operating room table in supine position with the left upper extremity on arm board.  Bier block anesthesia was induced by anesthesiologist.  Left upper extremity was prepped and draped in normal sterile orthopedic fashion.  Surgical pause was performed between surgeons, Anesthesia, and operating room staff; and all were in agreement as to the patient, procedure, and site of procedure.  Tourniquet at the proximal aspect of the forearm had been inflated for the Bier block.  The left upper extremity was prepped and draped in normal sterile orthopedic fashion.  A surgical pause was performed between surgeons, anesthesia, and operating room staff, and all were in agreement as to patient, procedure, and site of procedure. A hockey stick-shaped incision was made on the dorsum of the left long finger and carried into subcutaneous tissues by spreading technique. The mass was identified.  It was removed.  It was filled with clear gelatinous fluid.  It was sent to Pathology for examination.  The DIP joint was entered underneath the extensor tendon and debrided with the synovectomy rongeurs.  There was prominence of osteophyte at the dorsal radial aspect of the distal phalanx at the DIP joint.  This  was taken down with the rongeurs.  The skin covering the cyst was very thin dried layer of skin.  This was easily broken through and left a wound.  It was felt that a rotation flap was appropriate.  The skin surrounding the cyst was excised and the skin incision extended proximally to create the rotation flap.  This was loosened in the subcutaneous tissues with the scissors.  It was rotated into the defect.  The wound was copiously irrigated with sterile saline.  The skin was reapproximated using 4-0 nylon suture in a horizontal mattress fashion.  Good tension free reapproximation was obtained.  The wound was dressed with sterile Xeroform, 4x4s, and wrapped with a Coban  dressing lightly.  Alumafoam splint was placed and wrapped lightly with Coban dressing.  A digital block was performed with 0.25% plain Marcaine to aid in postoperative analgesia.  The tourniquet was deflated at 33 minutes.  Fingertips were pink with brisk capillary refill after deflation of tourniquet.  The operative drapes were broken down and the patient was awoken from anesthesia safely.  She was transferred back to stretcher and taken to PACU in stable condition.  I will see her back in the office in 1 week for postoperative followup.  I will give her Norco 5/325 one to two p.o. q.6 hours p.r.n. pain, dispensed #20.     Leanora Cover, MD     KK/MEDQ  D:  08/10/2017  T:  08/10/2017  Job:  [id number]

## 2017-08-10 NOTE — H&P (Signed)
Sarah Fisher is an 57 y.o. female.   Chief Complaint: left long finger mucoid cyst  HPI: 56 yo female with left long finger mass.  This is bothersome to her.  She wishes to have the cyst removed and the dip joint debrided to try to prevent recurrence.  Allergies: No Known Allergies  Past Medical History:  Diagnosis Date  . Anemia   . Arthritis   . Cervical spine degeneration 2010   C5-6  . History of carpal tunnel syndrome   . Hyperlipidemia   . Hypertension   . Sacroiliac joint pain 2010   right  . Scoliosis   . Skin cancer of eyelid     Past Surgical History:  Procedure Laterality Date  . BREAST SURGERY  1986   reduction  . cervical facet block  2010  . HAND SURGERY Bilateral 2000 and 2016   benign tumor removal   . WISDOM TOOTH EXTRACTION  1979    Family History: Family History  Problem Relation Age of Onset  . Hypertension Mother   . Arthritis Mother   . Hypertension Father   . Liver cancer Father   . Hypertension Sister   . Arthritis Maternal Aunt   . Hypertension Maternal Grandmother   . Kidney cancer Maternal Grandmother   . Alzheimer's disease Maternal Grandfather   . Colon cancer Maternal Grandfather 15  . Diabetes Maternal Grandfather   . Alzheimer's disease Paternal Grandmother   . Esophageal cancer Neg Hx   . Rectal cancer Neg Hx   . Stomach cancer Neg Hx     Social History:   reports that  has never smoked. she has never used smokeless tobacco. She reports that she does not drink alcohol or use drugs.  Medications: Medications Prior to Admission  Medication Sig Dispense Refill  . gabapentin (NEURONTIN) 100 MG capsule Take 100 mg by mouth 3 (three) times daily.    . Ginkgo Biloba 40 MG TABS Take by mouth.    Marland Kitchen GLUCOS-CHONDROIT-CA-MG-C-D PO Take by mouth daily.    Marland Kitchen ibuprofen (ADVIL,MOTRIN) 200 MG tablet Take 400 mg by mouth every 4 (four) hours as needed.     Marland Kitchen losartan (COZAAR) 50 MG tablet Take 1 tablet (50 mg total) by mouth daily. 90  tablet 1  . Multiple Vitamin (MULTIVITAMIN) LIQD Take 5 mLs by mouth daily.    . Multiple Vitamin (MULTIVITAMIN) tablet Take 1 tablet by mouth daily. With vit D    . Omega 3 1000 MG CAPS Take 1 capsule by mouth daily.    . simvastatin (ZOCOR) 10 MG tablet Take 1 tablet (10 mg total) by mouth daily. 90 tablet 1  . gabapentin (NEURONTIN) 100 MG capsule Take 1-3 capsules (100-300 mg total) by mouth every 8 (eight) hours. (Patient taking differently: Take 100-300 mg by mouth 3 (three) times daily. ) 90 capsule 0  . Green Tea 315 MG CAPS Take by mouth daily.    [name] Tea-Hoodia 315-12.5 MG CAPS Take by mouth.    . tizanidine (ZANAFLEX) 2 MG capsule Take 1 capsule (2 mg total) by mouth 3 (three) times daily as needed for muscle spasms. 90 capsule 2  . traMADol (ULTRAM) 50 MG tablet Take 1 tablet (50 mg total) by mouth every 6 (six) hours as needed for severe pain. 120 tablet 2    No results found for this or any previous visit (from the past 48 hour(s)).  No results found.   A comprehensive review of systems was negative.  Height 5' (1.524 m), weight 64.4 kg (142 lb).  General appearance: alert, cooperative and appears stated age Head: Normocephalic, without obvious abnormality, atraumatic Neck: supple, symmetrical, trachea midline Heart: regular rate, rhythm Lungs: clear to auscultation Extremities: Intact sensation and capillary refill all digits.  +epl/fpl/io.  No wounds.  Pulses: 2+ and symmetric Skin: Skin color, texture, turgor normal. No rashes or lesions Neurologic: Grossly normal Incision/Wound:none  Assessment/Plan Right long finger mucoid cyst.  Non operative and operative treatment options were discussed with the patient and patient wishes to proceed with operative treatment. We also discussed the possible need for a rotational skin flap as the cyst has enlarged.  Risks, benefits, and alternatives of surgery were discussed and the patient agrees with the plan of  care.   Corvette Orser R 08/10/2017, 12:10 PM

## 2017-08-10 NOTE — Anesthesia Preprocedure Evaluation (Signed)
Anesthesia Evaluation  Patient identified by MRN, date of birth, ID band Patient awake    Reviewed: Allergy & Precautions, H&P , NPO status , Patient's Chart, lab work & pertinent test results  Airway Mallampati: II   Neck ROM: full    Dental   Pulmonary neg pulmonary ROS,    breath sounds clear to auscultation       Cardiovascular hypertension,  Rhythm:regular Rate:Normal     Neuro/Psych  Headaches,  Neuromuscular disease    GI/Hepatic   Endo/Other    Renal/GU      Musculoskeletal  (+) Arthritis ,   Abdominal   Peds  Hematology   Anesthesia Other Findings   Reproductive/Obstetrics                             Anesthesia Physical Anesthesia Plan  ASA: II  Anesthesia Plan: MAC and Bier Block and Bier Block-LIDOCAINE ONLY   Post-op Pain Management:    Induction: Intravenous  PONV Risk Score and Plan: 2 and Ondansetron, Midazolam, Propofol infusion and Treatment may vary due to age or medical condition  Airway Management Planned: Simple Face Mask  Additional Equipment:   Intra-op Plan:   Post-operative Plan:   Informed Consent: I have reviewed the patients History and Physical, chart, labs and discussed the procedure including the risks, benefits and alternatives for the proposed anesthesia with the patient or authorized representative who has indicated his/her understanding and acceptance.     Plan Discussed with: CRNA, Anesthesiologist and Surgeon  Anesthesia Plan Comments:         Anesthesia Quick Evaluation

## 2017-08-10 NOTE — Op Note (Signed)
[id number] 

## 2017-08-10 NOTE — Brief Op Note (Signed)
08/10/2017  1:52 PM  PATIENT:  Sarah Fisher  57 y.o. female  PRE-OPERATIVE DIAGNOSIS:  left long finger mucoid cyst and DIP joint arthritis  POST-OPERATIVE DIAGNOSIS:  left long finger mucoid cyst and DIP joint arthritis  PROCEDURE:  Procedure(s): LEFT LONG FINGER EXCISION MASS (Left) DEBRIDEMENT DISTAL INTERPHALANGEAL JOINT (Left)  SURGEON:  Surgeon(s) and Role:    Leanora Cover, MD - Primary  PHYSICIAN ASSISTANT:   ASSISTANTS: none   ANESTHESIA:   Bier block with sedation  EBL:  5 mL   BLOOD ADMINISTERED:none  DRAINS: none   LOCAL MEDICATIONS USED:  MARCAINE     SPECIMEN:  Source of Specimen:  left long finger  DISPOSITION OF SPECIMEN:  PATHOLOGY  COUNTS:  YES  TOURNIQUET:   Total Tourniquet Time Documented: Upper Arm (Left) - 33 minutes Total: Upper Arm (Left) - 33 minutes   DICTATION: .Other Dictation: Dictation Number [id number]  PLAN OF CARE: Discharge to home after PACU  PATIENT DISPOSITION:  PACU - hemodynamically stable.

## 2017-08-10 NOTE — Anesthesia Postprocedure Evaluation (Signed)
Anesthesia Post Note  Patient: Sarah Fisher  Procedure(s) Performed: LEFT LONG FINGER EXCISION MASS (Left ) DEBRIDEMENT DISTAL INTERPHALANGEAL JOINT (Left )     Patient location during evaluation: PACU Anesthesia Type: MAC and Bier Block Level of consciousness: awake and alert Pain management: pain level controlled Vital Signs Assessment: post-procedure vital signs reviewed and stable Respiratory status: spontaneous breathing, nonlabored ventilation, respiratory function stable and patient connected to nasal cannula oxygen Cardiovascular status: stable and blood pressure returned to baseline Postop Assessment: no apparent nausea or vomiting Anesthetic complications: no    Last Vitals:  Vitals:   08/10/17 1415 08/10/17 1430  BP: 106/77 123/83  Pulse: (!) 58 76  Resp: 12 16  Temp:  36.7 C  SpO2: 97% 98%    Last Pain:  Vitals:   08/10/17 1430  TempSrc:   PainSc: 0-No pain                 [name]

## 2017-08-10 NOTE — Transfer of Care (Signed)
Immediate Anesthesia Transfer of Care Note  Patient: Sarah Fisher  Procedure(s) Performed: LEFT LONG FINGER EXCISION MASS (Left ) DEBRIDEMENT DISTAL INTERPHALANGEAL JOINT (Left )  Patient Location: PACU  Anesthesia Type:MAC and Bier block  Level of Consciousness: awake, alert  and oriented  Airway & Oxygen Therapy: Patient Spontanous Breathing and Patient connected to face mask oxygen  Post-op Assessment: Report given to RN and Post -op Vital signs reviewed and stable  Post vital signs: Reviewed and stable  Last Vitals:  Vitals:   08/10/17 1225  BP: 138/88  Pulse: 76  Resp: 14  Temp: 36.7 C  SpO2: 100%    Last Pain:  Vitals:   08/10/17 1225  TempSrc: Oral         Complications: No apparent anesthesia complications

## 2017-08-10 NOTE — Discharge Instructions (Addendum)

## 2017-08-12 ENCOUNTER — Encounter (HOSPITAL_BASED_OUTPATIENT_CLINIC_OR_DEPARTMENT_OTHER): Payer: Self-pay | Admitting: Orthopedic Surgery

## 2017-09-01 ENCOUNTER — Other Ambulatory Visit: Payer: Self-pay

## 2017-09-01 ENCOUNTER — Encounter: Payer: Self-pay | Admitting: Nurse Practitioner

## 2017-09-01 ENCOUNTER — Ambulatory Visit: Payer: BLUE CROSS/BLUE SHIELD | Attending: Nurse Practitioner | Admitting: Nurse Practitioner

## 2017-09-01 VITALS — BP 134/96 | HR 71 | Temp 98.0°F | Resp 16 | Ht 60.0 in | Wt 146.0 lb

## 2017-09-01 DIAGNOSIS — M4692 Unspecified inflammatory spondylopathy, cervical region: Secondary | ICD-10-CM | POA: Diagnosis not present

## 2017-09-01 DIAGNOSIS — M47812 Spondylosis without myelopathy or radiculopathy, cervical region: Secondary | ICD-10-CM | POA: Diagnosis not present

## 2017-09-01 DIAGNOSIS — G894 Chronic pain syndrome: Secondary | ICD-10-CM

## 2017-09-01 DIAGNOSIS — M170 Bilateral primary osteoarthritis of knee: Secondary | ICD-10-CM | POA: Insufficient documentation

## 2017-09-01 DIAGNOSIS — Z79891 Long term (current) use of opiate analgesic: Secondary | ICD-10-CM | POA: Insufficient documentation

## 2017-09-01 DIAGNOSIS — Z5181 Encounter for therapeutic drug level monitoring: Secondary | ICD-10-CM | POA: Insufficient documentation

## 2017-09-01 DIAGNOSIS — M4802 Spinal stenosis, cervical region: Secondary | ICD-10-CM | POA: Diagnosis not present

## 2017-09-01 DIAGNOSIS — M792 Neuralgia and neuritis, unspecified: Secondary | ICD-10-CM | POA: Diagnosis not present

## 2017-09-01 DIAGNOSIS — M7918 Myalgia, other site: Secondary | ICD-10-CM

## 2017-09-01 DIAGNOSIS — G5600 Carpal tunnel syndrome, unspecified upper limb: Secondary | ICD-10-CM | POA: Diagnosis not present

## 2017-09-01 DIAGNOSIS — M542 Cervicalgia: Secondary | ICD-10-CM | POA: Diagnosis present

## 2017-09-01 DIAGNOSIS — M25511 Pain in right shoulder: Secondary | ICD-10-CM | POA: Diagnosis not present

## 2017-09-01 DIAGNOSIS — R51 Headache: Secondary | ICD-10-CM | POA: Insufficient documentation

## 2017-09-01 DIAGNOSIS — M545 Low back pain, unspecified: Secondary | ICD-10-CM

## 2017-09-01 DIAGNOSIS — E785 Hyperlipidemia, unspecified: Secondary | ICD-10-CM | POA: Diagnosis not present

## 2017-09-01 DIAGNOSIS — M546 Pain in thoracic spine: Secondary | ICD-10-CM | POA: Diagnosis not present

## 2017-09-01 DIAGNOSIS — M4722 Other spondylosis with radiculopathy, cervical region: Secondary | ICD-10-CM | POA: Insufficient documentation

## 2017-09-01 DIAGNOSIS — M069 Rheumatoid arthritis, unspecified: Secondary | ICD-10-CM | POA: Diagnosis not present

## 2017-09-01 DIAGNOSIS — G8929 Other chronic pain: Secondary | ICD-10-CM | POA: Diagnosis not present

## 2017-09-01 DIAGNOSIS — Z79899 Other long term (current) drug therapy: Secondary | ICD-10-CM | POA: Insufficient documentation

## 2017-09-01 DIAGNOSIS — I10 Essential (primary) hypertension: Secondary | ICD-10-CM | POA: Diagnosis not present

## 2017-09-01 DIAGNOSIS — M5030 Other cervical disc degeneration, unspecified cervical region: Secondary | ICD-10-CM | POA: Insufficient documentation

## 2017-09-01 DIAGNOSIS — M25512 Pain in left shoulder: Secondary | ICD-10-CM

## 2017-09-01 DIAGNOSIS — M5412 Radiculopathy, cervical region: Secondary | ICD-10-CM | POA: Diagnosis not present

## 2017-09-01 MED ORDER — TIZANIDINE HCL 2 MG PO CAPS: 2.0000 mg | ORAL_CAPSULE | Freq: Three times a day (TID) | ORAL | 5 refills | Status: DC | PRN

## 2017-09-01 MED ORDER — GABAPENTIN 100 MG PO CAPS: 100.0000 mg | ORAL_CAPSULE | Freq: Four times a day (QID) | ORAL | 5 refills | Status: DC

## 2017-09-01 NOTE — Progress Notes (Addendum)
Patient's Name: Sarah Fisher  MRN: [id number]  Referring Provider: Ma Hillock, DO  DOB: 10-28-1960  PCP: Ma Hillock, DO  DOS: 09/01/2017  Note by: Vevelyn Francois NP  Service setting: Ambulatory outpatient  Specialty: Interventional Pain Management  Location: [facility] (AMB) Pain Management Facility    Patient type: Established    Primary Reason(s) for Visit: Encounter for prescription drug management. (Level of risk: moderate)  CC: Neck Pain (left)  HPI  Ms. Vandehei is a 57 y.o. year old, female patient, who comes today for a medication management evaluation. She has Hypertension; Hyperlipidemia; Overweight; Vitamin D deficiency; Benign paroxysmal positional vertigo of left ear; Adenomatous polyp of colon; Chronic pain syndrome; Osteoarthritis of cervical spine; Cervical DDD (degenerative disc disease) (C5-6); Cervical central spinal stenosis C5-6 (Right); Chronic neck pain (Location of Primary Source of Pain) (Bilateral) (L>R); Cervicogenic headache (Bilateral) (R>L); Occipital headache (Bilateral) (R>L); Neurogenic pain; Myofascial pain; Chronic shoulder pain (Location of Tertiary source of pain) (Bilateral) (L>R); Chronic upper back pain (Location of Secondary source of pain) (Bilateral) (L>R); Chronic low back pain (Bilateral) (L>R); Cervical spondylosis; Numbness of upper extremity (Bilateral) (L>R); Chronic cervical radiculopathy (Bilateral) (L>R); Chronic knee pain (Bilateral) (R>L); Osteoarthritis of knee (Bilateral) (R>L); Rheumatoid arthritis, seronegative, shoulder region (Bilateral) (L>R); Chronic idiopathic constipation; and Long term (current) use of opiate analgesic on their problem list. Her primarily concern today is the Neck Pain (left)  Pain Assessment: Location: Left Neck Radiating: radiates into left arm and fingers Onset: More than a month ago Duration: Chronic pain Quality: Throbbing, Numbness, Sharp, Aching Severity: 6 /10 (self-reported pain score)  Note: Reported  level is compatible with observation. Clinically the patient looks like a 3/10 A 3/10 is viewed as "Moderate" and described as significantly interfering with activities of daily living (ADL). It becomes difficult to feed, bathe, get dressed, get on and off the toilet or to perform personal hygiene functions. Difficult to get in and out of bed or a chair without assistance. Very distracting. With effort, it can be ignored when deeply involved in activities. Information on the proper use of the pain scale provided to the patient today. When using our objective Pain Scale, levels between 6 and 10/10 are said to belong in an emergency room, as it progressively worsens from a 6/10, described as severely limiting, requiring emergency care not usually available at an outpatient pain management facility. At a 6/10 level, communication becomes difficult and requires great effort. Assistance to reach the emergency department may be required. Facial flushing and profuse sweating along with potentially dangerous increases in heart rate and blood pressure will be evident. Timing: Constant Modifying factors: heat helps  Ms. Lahm was last scheduled for an appointment on Visit date not found for medication management. During today's appointment we reviewed Ms. Brashears's chronic pain status, as well as her outpatient medication regimen. She is heaviness in her arms along with the numbness and tingling. She admits that the left is greater than the right but both are painful. She has started increasing Gabapentin slowly and is currently up to 3 tabs QHS. She states that her pain is not improving. She would like to have an injection. She amdits that the heating pad does help some with the upper back pain. She is not using the Tramadol.  The patient  reports that she does not use drugs. Her body mass index is 28.51 kg/m.  Further details on both, my assessment(s), as well as the proposed treatment plan, please see  below.  Controlled Substance Pharmacotherapy Assessment REMS (Risk Evaluation and Mitigation Strategy)  Analgesic: Tramadol 50 mg 1 tablet by mouth 4 times a day (200 mg/day of tramadol) MME/day: 20 mg/day.   Dewayne Shorter, RN  09/01/2017 10:13 AM  Signed Safety precautions to be maintained throughout the outpatient stay will include: orient to surroundings, keep bed in low position, maintain call bell within reach at all times, provide assistance with transfer out of bed and ambulation.   Pharmacokinetics: Liberation and absorption (onset of action): WNL Distribution (time to peak effect): WNL Metabolism and excretion (duration of action): WNL         Pharmacodynamics: Desired effects: Analgesia: Ms. Jemmott reports >50% benefit. Functional ability: Patient reports that medication allows her to accomplish basic ADLs Clinically meaningful improvement in function (CMIF): Sustained CMIF goals met Perceived effectiveness: Described as relatively effective, allowing for increase in activities of daily living (ADL) Undesirable effects: Side-effects or Adverse reactions: None reported Monitoring: [place] PMP: Online review of the past 58-monthperiod conducted. Compliant with practice rules and regulations Last UDS on record: No results found for: SUMMARY UDS interpretation: Compliant          Medication Assessment Form: Reviewed. Patient indicates being compliant with therapy Treatment compliance: Compliant Risk Assessment Profile: Aberrant behavior: See prior evaluations. None observed or detected today Comorbid factors increasing risk of overdose: See prior notes. No additional risks detected today Risk of substance use disorder (SUD): Low Opioid Risk Tool - 09/01/17 1012      Family History of Substance Abuse   Alcohol  Negative    Illegal Drugs  Negative    Rx Drugs  Negative      Personal History of Substance Abuse   Alcohol  Negative    Illegal Drugs  Negative    Rx Drugs   Negative      Age   Age between 128-57years   No      History of Preadolescent Sexual Abuse   History of Preadolescent Sexual Abuse  Negative or Female      Psychological Disease   Psychological Disease  Negative    Depression  Negative      Total Score   Opioid Risk Tool Scoring  0    Opioid Risk Interpretation  Low Risk      ORT Scoring interpretation table:  Score <3 = Low Risk for SUD  Score between 4-7 = Moderate Risk for SUD  Score >8 = High Risk for Opioid Abuse   Risk Mitigation Strategies:  Patient Counseling: Covered Patient-Prescriber Agreement (PPA): Present and active  Notification to other healthcare providers: Done  Pharmacologic Plan: No change in therapy, at this time.             Laboratory Chemistry  Inflammation Markers (CRP: Acute Phase) (ESR: Chronic Phase) Lab Results  Component Value Date   CRP <0.8 10/01/2016   ESRSEDRATE 21 10/01/2016                         Rheumatology Markers Lab Results  Component Value Date   RF <10.0 10/01/2016   ANA Negative 10/01/2016                Renal Function Markers Lab Results  Component Value Date   BUN 18 06/10/2017   CREATININE 0.67 06/10/2017   GFRAA >60 10/01/2016   GFRNONAA >60 10/01/2016  Hepatic Function Markers Lab Results  Component Value Date   AST 17 06/10/2017   ALT 16 06/10/2017   ALBUMIN 4.7 06/10/2017   ALKPHOS 63 06/10/2017   HCVAB NEGATIVE 05/15/2016                 Electrolytes Lab Results  Component Value Date   NA 142 06/10/2017   K 4.2 06/10/2017   CL 103 06/10/2017   CALCIUM 9.6 06/10/2017   MG 2.2 10/01/2016                        Neuropathy Markers Lab Results  Component Value Date   DXIPJASN05 397 10/01/2016   HGBA1C 5.4 06/10/2017   HIV NONREACTIVE 05/15/2016                 Bone Pathology Markers Lab Results  Component Value Date   VD25OH 27.56 (L) 06/10/2017   25OHVITD1 40 10/01/2016   25OHVITD2 18 10/01/2016   25OHVITD3 22  10/01/2016                         Coagulation Parameters Lab Results  Component Value Date   PLT 322.0 06/10/2017                 Cardiovascular Markers Lab Results  Component Value Date   HGB 12.7 06/10/2017   HCT 38.8 06/10/2017                 CA Markers No results found for: CEA, CA125, LABCA2               Note: Lab results reviewed.  Recent Diagnostic Imaging Results  MR CERVICAL SPINE WO CONTRAST CLINICAL DATA:  Recurring neck pain radiating into both shoulders and arms for 14 years. Occasional tingling and numbness in both hands. No acute injury or prior relevant surgery.  EXAM: MRI CERVICAL SPINE WITHOUT CONTRAST  TECHNIQUE: Multiplanar, multisequence MR imaging of the cervical spine was performed. No intravenous contrast was administered.  COMPARISON:  Radiographs 10/01/2016.  Cervical MRI 10/21/2007.  FINDINGS: Alignment: Straightening without angulation or listhesis.  Vertebrae: No acute or suspicious osseous findings.  Cord: Normal in signal and caliber.  Posterior Fossa, vertebral arteries, paraspinal tissues: Visualized portions of the posterior fossa and paraspinal soft tissues appear unremarkable. Bilateral vertebral artery flow voids. The right vertebral artery is small, but appears unchanged.  Disc levels:  C2-3: The disc appears normal. Asymmetric facet hypertrophy on the left without resulting spinal stenosis.  C3-4: The disc appears normal. There is left-greater-than-right facet hypertrophy without significant foraminal compromise.  C4-5: The disc appears normal. Mild bilateral facet hypertrophy. No spinal stenosis or nerve root encroachment.  C5-6: Mildly progressive loss of disc height with endplate osteophytes covering diffusely bulging disc material. The AP diameter of the canal is 9 mm. There is no cord deformity. There is a left foraminal disc osteophyte complex which may contribute to left C6 nerve root encroachment. The  right foramen appears only mildly narrowed.  C6-7: Mild disc bulging and uncinate spurring. The left vertebral artery is tortuous, extending into the left foramen. No cord deformity.  C7-T1: The disc appears normal. Mild bilateral facet hypertrophy. No spinal stenosis or nerve root encroachment.  IMPRESSION: 1. Progressive spondylosis at C5-6 compared with previous MRI from 2009. Left foraminal disc osteophyte complex may contribute to left C6 nerve root encroachment. 2. Dominant left vertebral artery is tortuous, extending into left foramen  at C6-7 (and to a lesser degree at C5-6). This could contribute to left-sided nerve root encroachment. 3. Mild spinal stenosis at C5-6 without cord deformity. No abnormal cord signal.  Electronically Signed   By: Richardean Sale M.D.   On: 07/06/2017 13:59  Complexity Note: Imaging results reviewed. Results shared with Ms. Geddes, using Layman's terms.                         Meds   Current Outpatient Medications:  .  cholecalciferol (VITAMIN D) 1000 units tablet, Take 1,000 Units by mouth daily., Disp: , Rfl:  .  gabapentin (NEURONTIN) 100 MG capsule, Take 1-3 capsules (100-300 mg total) by mouth 4 (four) times daily., Disp: 120 capsule, Rfl: 5 .  GLUCOS-CHONDROIT-CA-MG-C-D PO, Take by mouth daily., Disp: , Rfl:  .  Green Tea 315 MG CAPS, Take by mouth daily., Disp: , Rfl:  .  ibuprofen (ADVIL,MOTRIN) 200 MG tablet, Take 400 mg by mouth every 4 (four) hours as needed. , Disp: , Rfl:  .  losartan (COZAAR) 50 MG tablet, Take 1 tablet (50 mg total) by mouth daily., Disp: 90 tablet, Rfl: 1 .  Multiple Vitamin (MULTIVITAMIN) LIQD, Take 5 mLs by mouth daily., Disp: , Rfl:  .  Omega 3 1000 MG CAPS, Take 1 capsule by mouth daily., Disp: , Rfl:  .  simvastatin (ZOCOR) 10 MG tablet, Take 1 tablet (10 mg total) by mouth daily., Disp: 90 tablet, Rfl: 1 .  traMADol (ULTRAM) 50 MG tablet, Take 1 tablet (50 mg total) by mouth every 6 (six) hours as  needed for severe pain., Disp: 120 tablet, Rfl: 2 .  tizanidine (ZANAFLEX) 2 MG capsule, Take 1 capsule (2 mg total) by mouth 3 (three) times daily as needed for muscle spasms., Disp: 90 capsule, Rfl: 5  ROS  Constitutional: Denies any fever or chills Gastrointestinal: No reported hemesis, hematochezia, vomiting, or acute GI distress Musculoskeletal: Denies any acute onset joint swelling, redness, loss of ROM, or weakness Neurological: No reported episodes of acute onset apraxia, aphasia, dysarthria, agnosia, amnesia, paralysis, loss of coordination, or loss of consciousness  Allergies  Ms. Watrous has No Known Allergies.  [place]  Drug: Ms. Shambley  reports that she does not use drugs. Alcohol:  reports that she does not drink alcohol. Tobacco:  reports that  has never smoked. she has never used smokeless tobacco. Medical:  has a past medical history of Anemia, Arthritis, Cervical spine degeneration (2010), History of carpal tunnel syndrome, Hyperlipidemia, Hypertension, Sacroiliac joint pain (2010), Scoliosis, and Skin cancer of eyelid. Surgical: Ms. Mckay  has a past surgical history that includes Breast surgery (1986); Hand surgery (Bilateral, 2000 and 2016); cervical facet block (2010); Wisdom tooth extraction (1979); Mass excision (Left, 08/10/2017); I&D extremity (Left, 08/10/2017); and left finger surgery (Left, 08/2017). Family: family history includes Alzheimer's disease in her maternal grandfather and paternal grandmother; Arthritis in her maternal aunt and mother; Colon cancer (age of onset: 66) in her maternal grandfather; Diabetes in her maternal grandfather; Hypertension in her father, maternal grandmother, mother, and sister; Kidney cancer in her maternal grandmother; Liver cancer in her father.  Constitutional Exam  General appearance: Well nourished, well developed, and well hydrated. In no apparent acute distress Vitals:   09/01/17 1003  BP: (!) 134/96  Pulse: 71  Resp: 16   Temp: 98 F (36.7 C)  SpO2: 99%  Weight: 146 lb (66.2 kg)  Height: 5' (1.524 m)   BMI Assessment:  Estimated body mass index is 28.51 kg/m as calculated from the following:   Height as of this encounter: 5' (1.524 m).   Weight as of this encounter: 146 lb (66.2 kg). Psych/Mental status: Alert, oriented x 3 (person, place, & time)       Eyes: PERLA Respiratory: No evidence of acute respiratory distress  Cervical Spine Area Exam  Skin & Axial Inspection: No masses, redness, edema, swelling, or associated skin lesions Alignment: Symmetrical Functional ROM: Unrestricted ROM      Stability: No instability detected Muscle Tone/Strength: Functionally intact. No obvious neuro-muscular anomalies detected. Sensory (Neurological): Unimpaired Palpation: Complains of area being tender to palpation              Upper Extremity (UE) Exam    Side: Right upper extremity  Side: Left upper extremity  Skin & Extremity Inspection: Skin color, temperature, and hair growth are WNL. No peripheral edema or cyanosis. No masses, redness, swelling, asymmetry, or associated skin lesions. No contractures.  Skin & Extremity Inspection: Skin color, temperature, and hair growth are WNL. No peripheral edema or cyanosis. No masses, redness, swelling, asymmetry, or associated skin lesions. No contractures.  Functional ROM: Unrestricted ROM          Functional ROM: Unrestricted ROM          Muscle Tone/Strength: Functionally intact. No obvious neuro-muscular anomalies detected.  Muscle Tone/Strength: Functionally intact. No obvious neuro-muscular anomalies detected.  Sensory (Neurological): Unimpaired          Sensory (Neurological): Unimpaired          Palpation: No palpable anomalies              Palpation: No palpable anomalies              Specialized Test(s): Deferred         Specialized Test(s): Deferred          Thoracic Spine Area Exam  Skin & Axial Inspection: No masses, redness, or swelling Alignment:  Symmetrical Functional ROM: Unrestricted ROM Stability: No instability detected Muscle Tone/Strength: Functionally intact. No obvious neuro-muscular anomalies detected. Sensory (Neurological): Unimpaired Muscle strength & Tone: No palpable anomalies  Lumbar Spine Area Exam  Skin & Axial Inspection: No masses, redness, or swelling Alignment: Symmetrical Functional ROM: Unrestricted ROM      Stability: No instability detected Muscle Tone/Strength: Functionally intact. No obvious neuro-muscular anomalies detected. Sensory (Neurological): Unimpaired Palpation: No palpable anomalies       Provocative Tests: Lumbar Hyperextension and rotation test: evaluation deferred today       Lumbar Lateral bending test: evaluation deferred today       Patrick's Maneuver: evaluation deferred today                    Gait & Posture Assessment  Ambulation: Unassisted Gait: Relatively normal for age and body habitus Posture: WNL   Lower Extremity Exam    Side: Right lower extremity  Side: Left lower extremity  Skin & Extremity Inspection: Skin color, temperature, and hair growth are WNL. No peripheral edema or cyanosis. No masses, redness, swelling, asymmetry, or associated skin lesions. No contractures.  Skin & Extremity Inspection: Skin color, temperature, and hair growth are WNL. No peripheral edema or cyanosis. No masses, redness, swelling, asymmetry, or associated skin lesions. No contractures.  Functional ROM: Unrestricted ROM          Functional ROM: Unrestricted ROM          Muscle Tone/Strength: Functionally intact. No  obvious neuro-muscular anomalies detected.  Muscle Tone/Strength: Functionally intact. No obvious neuro-muscular anomalies detected.  Sensory (Neurological): Unimpaired  Sensory (Neurological): Unimpaired  Palpation: No palpable anomalies  Palpation: No palpable anomalies   Assessment  Primary Diagnosis & Pertinent Problem List: The primary encounter diagnosis was Cervical  spondylosis. Diagnoses of Chronic upper back pain (Location of Secondary source of pain) (Bilateral) (L>R), Chronic shoulder pain (Location of Tertiary source of pain) (Bilateral) (L>R), Myofascial pain, Chronic low back pain (Bilateral) (L>R), Chronic pain syndrome, and Neurogenic pain were also pertinent to this visit.  Status Diagnosis  Worsening Worsening Persistent 1. Cervical spondylosis   2. Chronic upper back pain (Location of Secondary source of pain) (Bilateral) (L>R)   3. Chronic shoulder pain (Location of Tertiary source of pain) (Bilateral) (L>R)   4. Myofascial pain   5. Chronic low back pain (Bilateral) (L>R)   6. Chronic pain syndrome   7. Neurogenic pain     Problems updated and reviewed during this visit: No problems updated. Plan of Care  Pharmacotherapy (Medications Ordered): Meds ordered this encounter  Medications  . tizanidine (ZANAFLEX) 2 MG capsule    Sig: Take 1 capsule (2 mg total) by mouth 3 (three) times daily as needed for muscle spasms.    Dispense:  90 capsule    Refill:  5    Do not place this medication, or any other prescription from our practice, on "Automatic Refill". Patient may have prescription filled one day early if pharmacy is closed on scheduled refill date.    Order Specific Question:   Supervising Provider    Answer:   Milinda Pointer [phone]  . gabapentin (NEURONTIN) 100 MG capsule    Sig: Take 1-3 capsules (100-300 mg total) by mouth 4 (four) times daily.    Dispense:  120 capsule    Refill:  5    Do not place this medication, or any other prescription from our practice, on "Automatic Refill". Patient may have prescription filled one day early if pharmacy is closed on scheduled refill date.    Order Specific Question:   Supervising Provider    Answer:   SHERILYN, WINDHORST [[id number]]   New Prescriptions   No medications on file   Medications administered today: [name] had no medications administered during this  visit. Lab-work, procedure(s), and/or referral(s): No orders of the defined types were placed in this encounter.  Imaging and/or referral(s): None  Interventional therapies: Planned, scheduled, and/or pending:   Bilateral CESI with sedation   Considering:   Diagnostic bilateral cervical facet block Possible bilateral cervical facet RFA Diagnostic bilateral greater occipital nerve block Possible bilateral greater occipital RFA Diagnostic bilateral C2 +TON nerve block Possible bilateral C2 + TON RFA Possible bilateral occipital nerve stimulator trial Diagnostic bilateral lumbar facet block Possible bilateral lumbar facet RFA Diagnostic bilateral intra-articular knee jointinjection with local anesthetic and steroid  Possible bilateral series of 5 intra-articular Hyalgan knee injections Diagnostic bilateral Genicular nerve block Possible bilateral Genicular nerve RFA Diagnostic costochondral/xiphoid local anesthetic and steroid injections   Results:   Diagnostic left cervical epidural steroid injection: (10/21/16) No apparent benefit.   Palliative PRN treatment(s):   None at this time.     Provider-requested follow-up: Return in about 6 months (around 03/01/2018) for MedMgmt with Me Dionisio David), Procedure(w/Sedation), w/ Dr. Holley Raring.  Future Appointments  Date Time Provider [place]  09/09/2017  9:30 AM Gillis Santa, MD [facility] None  02/24/2018  9:45 AM Vevelyn Francois, NP [facility] None  Primary Care Physician: Ma Hillock, DO Location: [facility] Note by: Vevelyn Francois NP Date: 09/01/2017; Time: 11:22 AM  Pain Score Disclaimer: We use the NRS-11 scale. This is a self-reported, subjective measurement of pain severity with only modest accuracy. It is used primarily to identify changes within a particular patient. It must be understood that outpatient pain scales are significantly less accurate that those used for  research, where they can be applied under ideal controlled circumstances with minimal exposure to variables. In reality, the score is likely to be a combination of pain intensity and pain affect, where pain affect describes the degree of emotional arousal or changes in action readiness caused by the sensory experience of pain. Factors such as social and work situation, setting, emotional state, anxiety levels, expectation, and prior pain experience may influence pain perception and show large inter-individual differences that may also be affected by time variables.  Patient instructions provided during this appointment: Patient Instructions   ____________________________________________________________________________________________  Medication Rules  Applies to: All patients receiving prescriptions (written or electronic).  Pharmacy of record: Pharmacy where electronic prescriptions will be sent. If written prescriptions are taken to a different pharmacy, please inform the nursing staff. The pharmacy listed in the electronic medical record should be the one where you would like electronic prescriptions to be sent.  Prescription refills: Only during scheduled appointments. Applies to both, written and electronic prescriptions.  NOTE: The following applies primarily to controlled substances (Opioid* Pain Medications).   Patient's responsibilities: 1. Pain Pills: Bring all pain pills to every appointment (except for procedure appointments). 2. Pill Bottles: Bring pills in original pharmacy bottle. Always bring newest bottle. Bring bottle, even if empty. 3. Medication refills: You are responsible for knowing and keeping track of what medications you need refilled. The day before your appointment, write a list of all prescriptions that need to be refilled. Bring that list to your appointment and give it to the admitting nurse. Prescriptions will be written only during appointments. If you forget a  medication, it will not be "Called in", "Faxed", or "electronically sent". You will need to get another appointment to get these prescribed. 4. Prescription Accuracy: You are responsible for carefully inspecting your prescriptions before leaving our office. Have the discharge nurse carefully go over each prescription with you, before taking them home. Make sure that your name is accurately spelled, that your address is correct. Check the name and dose of your medication to make sure it is accurate. Check the number of pills, and the written instructions to make sure they are clear and accurate. Make sure that you are given enough medication to last until your next medication refill appointment. 5. Taking Medication: Take medication as prescribed. Never take more pills than instructed. Never take medication more frequently than prescribed. Taking less pills or less frequently is permitted and encouraged, when it comes to controlled substances (written prescriptions).  6. Inform other Doctors: Always inform, all of your healthcare providers, of all the medications you take. 7. Pain Medication from other Providers: You are not allowed to accept any additional pain medication from any other Doctor or Healthcare provider. There are two exceptions to this rule. (see below) In the event that you require additional pain medication, you are responsible for notifying us, as stated below. 8. Medication Agreement: You are responsible for carefully reading and following our Medication Agreement. This must be signed before receiving any prescriptions from our practice. Safely store a copy of your  signed Agreement. Violations to the Agreement will result in no further prescriptions. (Additional copies of our Medication Agreement are available upon request.) 9. Laws, Rules, & Regulations: All patients are expected to follow all Federal and [employer], [employer], Rules, [employer]. Ignorance of the Laws does not constitute a  valid excuse. The use of any illegal substances is prohibited. 10. Adopted CDC guidelines & recommendations: Target dosing levels will be at or below 60 MME/day. Use of benzodiazepines** is not recommended.  Exceptions: There are only two exceptions to the rule of not receiving pain medications from other Healthcare Providers. 1. Exception #1 (Emergencies): In the event of an emergency (i.e.: accident requiring emergency care), you are allowed to receive additional pain medication. However, you are responsible for: As soon as you are able, call our office [phone], at any time of the day or night, and leave a message stating your name, the date and nature of the emergency, and the name and dose of the medication prescribed. In the event that your call is answered by a member of our staff, make sure to document and save the date, time, and the name of the person that took your information.  2. Exception #2 (Planned Surgery): In the event that you are scheduled by another doctor or dentist to have any type of surgery or procedure, you are allowed (for a period no longer than 30 days), to receive additional pain medication, for the acute post-op pain. However, in this case, you are responsible for picking up a copy of our "Post-op Pain Management for Surgeons" handout, and giving it to your surgeon or dentist. This document is available at our office, and does not require an appointment to obtain it. Simply go to our office during business hours (Monday-Thursday from 8:00 AM to 4:00 PM) (Friday 8:00 AM to 12:00 Noon) or if you have a scheduled appointment with [place], prior to your surgery, and ask for it by name. In addition, you will need to provide [place] with your name, name of your surgeon, type of surgery, and date of procedure or surgery.  *Opioid medications include: morphine, codeine, oxycodone, oxymorphone, hydrocodone, hydromorphone, meperidine, tramadol, tapentadol, buprenorphine, fentanyl,  methadone. **Benzodiazepine medications include: diazepam (Valium), alprazolam (Xanax), clonazepam (Klonopine), lorazepam (Ativan), clorazepate (Tranxene), chlordiazepoxide (Librium), estazolam (Prosom), oxazepam (Serax), temazepam (Restoril), triazolam (Halcion) ____________________________________________________________________________________________  ____________________________________________________________________________________________  Pain Scale  Introduction: The pain score used by this practice is the Verbal Numerical Rating Scale (VNRS-11). This is an 11-point scale. It is for adults and children 10 years or older. There are significant differences in how the pain score is reported, used, and applied. Forget everything you learned in the past and learn this scoring system.  General Information: The scale should reflect your current level of pain. Unless you are specifically asked for the level of your worst pain, or your average pain. If you are asked for one of these two, then it should be understood that it is over the past 24 hours.  Basic Activities of Daily Living (ADL): Personal hygiene, dressing, eating, transferring, and using restroom.  Instructions: Most patients tend to report their level of pain as a combination of two factors, their physical pain and their psychosocial pain. This last one is also known as "suffering" and it is reflection of how physical pain affects you socially and psychologically. From now on, report them separately. From this point on, when asked to report your pain level, report only your physical pain. Use the following table for  reference.  Pain Clinic Pain Levels (0-5/10)  Pain Level Score  Description  No Pain 0   Mild pain 1 Nagging, annoying, but does not interfere with basic activities of daily living (ADL). Patients are able to eat, bathe, get dressed, toileting (being able to get on and off the toilet and perform personal hygiene  functions), transfer (move in and out of bed or a chair without assistance), and maintain continence (able to control bladder and bowel functions). Blood pressure and heart rate are unaffected. A normal heart rate for a healthy adult ranges from 60 to 100 bpm (beats per minute).   Mild to moderate pain 2 Noticeable and distracting. Impossible to hide from other people. More frequent flare-ups. Still possible to adapt and function close to normal. It can be very annoying and may have occasional stronger flare-ups. With discipline, patients may get used to it and adapt.   Moderate pain 3 Interferes significantly with activities of daily living (ADL). It becomes difficult to feed, bathe, get dressed, get on and off the toilet or to perform personal hygiene functions. Difficult to get in and out of bed or a chair without assistance. Very distracting. With effort, it can be ignored when deeply involved in activities.   Moderately severe pain 4 Impossible to ignore for more than a few minutes. With effort, patients may still be able to manage work or participate in some social activities. Very difficult to concentrate. Signs of autonomic nervous system discharge are evident: dilated pupils (mydriasis); mild sweating (diaphoresis); sleep interference. Heart rate becomes elevated (>115 bpm). Diastolic blood pressure (lower number) rises above 100 mmHg. Patients find relief in laying down and not moving.   Severe pain 5 Intense and extremely unpleasant. Associated with frowning face and frequent crying. Pain overwhelms the senses.  Ability to do any activity or maintain social relationships becomes significantly limited. Conversation becomes difficult. Pacing back and forth is common, as getting into a comfortable position is nearly impossible. Pain wakes you up from deep sleep. Physical signs will be obvious: pupillary dilation; increased sweating; goosebumps; brisk reflexes; cold, clammy hands and feet; nausea,  vomiting or dry heaves; loss of appetite; significant sleep disturbance with inability to fall asleep or to remain asleep. When persistent, significant weight loss is observed due to the complete loss of appetite and sleep deprivation.  Blood pressure and heart rate becomes significantly elevated. Caution: If elevated blood pressure triggers a pounding headache associated with blurred vision, then the patient should immediately seek attention at an urgent or emergency care unit, as these may be signs of an impending stroke.    Emergency Department Pain Levels (6-10/10)  Emergency Room Pain 6 Severely limiting. Requires emergency care and should not be seen or managed at an outpatient pain management facility. Communication becomes difficult and requires great effort. Assistance to reach the emergency department may be required. Facial flushing and profuse sweating along with potentially dangerous increases in heart rate and blood pressure will be evident.   Distressing pain 7 Self-care is very difficult. Assistance is required to transport, or use restroom. Assistance to reach the emergency department will be required. Tasks requiring coordination, such as bathing and getting dressed become very difficult.   Disabling pain 8 Self-care is no longer possible. At this level, pain is disabling. The individual is unable to do even the most "basic" activities such as walking, eating, bathing, dressing, transferring to a bed, or toileting. Fine motor skills are lost. It is difficult to think clearly.  Incapacitating pain 9 Pain becomes incapacitating. Thought processing is no longer possible. Difficult to remember your own name. Control of movement and coordination are lost.   The worst pain imaginable 10 At this level, most patients pass out from pain. When this level is reached, collapse of the autonomic nervous system occurs, leading to a sudden drop in blood pressure and heart rate. This in turn results  in a temporary and dramatic drop in blood flow to the brain, leading to a loss of consciousness. Fainting is one of the body's self defense mechanisms. Passing out puts the brain in a calmed state and causes it to shut down for a while, in order to begin the healing process.    Summary: 1. Refer to this scale when providing [place] with your pain level. 2. Be accurate and careful when reporting your pain level. This will help with your care. 3. Over-reporting your pain level will lead to loss of credibility. 4. Even a level of 1/10 means that there is pain and will be treated at our facility. 5. High, inaccurate reporting will be documented as "Symptom Exaggeration", leading to loss of credibility and suspicions of possible secondary gains such as obtaining more narcotics, or wanting to appear disabled, for fraudulent reasons. 6. Only pain levels of 5 or below will be seen at our facility. 7. Pain levels of 6 and above will be sent to the Emergency Department and the appointment cancelled. ____________________________________________________________________________________________    Epidural Steroid Injection An epidural steroid injection is a shot of steroid medicine and numbing medicine that is given into the space between the spinal cord and the bones in your back (epidural space). The shot helps relieve pain caused by an irritated or swollen nerve root. The amount of pain relief you get from the injection depends on what is causing the nerve to be swollen and irritated, and how long your pain lasts. You are more likely to benefit from this injection if your pain is strong and comes on suddenly rather than if you have had pain for a long time. Tell a health care provider about:  Any allergies you have.  All medicines you are taking, including vitamins, herbs, eye drops, creams, and over-the-counter medicines.  Any problems you or family members have had with anesthetic medicines.  Any blood  disorders you have.  Any surgeries you have had.  Any medical conditions you have.  Whether you are pregnant or may be pregnant. What are the risks? Generally, this is a safe procedure. However, problems may occur, including:  Headache.  Bleeding.  Infection.  Allergic reaction to medicines.  Damage to your nerves.  What happens before the procedure? Staying hydrated Follow instructions from your health care provider about hydration, which may include:  Up to 2 hours before the procedure - you may continue to drink clear liquids, such as water, clear fruit juice, black coffee, and plain tea.  Eating and drinking restrictions Follow instructions from your health care provider about eating and drinking, which may include:  8 hours before the procedure - stop eating heavy meals or foods such as meat, fried foods, or fatty foods.  6 hours before the procedure - stop eating light meals or foods, such as toast or cereal.  6 hours before the procedure - stop drinking milk or drinks that contain milk.  2 hours before the procedure - stop drinking clear liquids.  Medicine  You may be given medicines to lower anxiety.  Ask your health care  provider about: ? Changing or stopping your regular medicines. This is especially important if you are taking diabetes medicines or blood thinners. ? Taking medicines such as aspirin and ibuprofen. These medicines can thin your blood. Do not take these medicines before your procedure if your health care provider instructs you not to. General instructions  Plan to have someone take you home from the hospital or clinic. What happens during the procedure?  You may receive a medicine to help you relax (sedative).  You will be asked to lie on your abdomen.  The injection site will be cleaned.  A numbing medicine (local anesthetic) will be used to numb the injection site.  A needle will be inserted through your skin into the epidural space.  You may feel some discomfort when this happens. An X-ray machine will be used to make sure the needle is put as close as possible to the affected nerve.  A steroid medicine and a local anesthetic will be injected into the epidural space.  The needle will be removed.  A bandage (dressing) will be put over the injection site. What happens after the procedure?  Your blood pressure, heart rate, breathing rate, and blood oxygen level will be monitored until the medicines you were given have worn off.  Your arm or leg may feel weak or numb for a few hours.  The injection site may feel sore.  Do not drive for 24 hours if you received a sedative. This information is not intended to replace advice given to you by your health care provider. Make sure you discuss any questions you have with your health care provider. Document Released: 09/30/2007 Document Revised: 12/05/2015 Document Reviewed: 10/09/2015 [employer] Interactive Patient Education  [employer].

## 2017-09-01 NOTE — Patient Instructions (Addendum)
____________________________________________________________________________________________  Medication Rules  Applies to: All patients receiving prescriptions (written or electronic).  Pharmacy of record: Pharmacy where electronic prescriptions will be sent. If written prescriptions are taken to a different pharmacy, please inform the nursing staff. The pharmacy listed in the electronic medical record should be the one where you would like electronic prescriptions to be sent.  Prescription refills: Only during scheduled appointments. Applies to both, written and electronic prescriptions.  NOTE: The following applies primarily to controlled substances (Opioid* Pain Medications).   Patient's responsibilities: 1. Pain Pills: Bring all pain pills to every appointment (except for procedure appointments). 2. Pill Bottles: Bring pills in original pharmacy bottle. Always bring newest bottle. Bring bottle, even if empty. 3. Medication refills: You are responsible for knowing and keeping track of what medications you need refilled. The day before your appointment, write a list of all prescriptions that need to be refilled. Bring that list to your appointment and give it to the admitting nurse. Prescriptions will be written only during appointments. If you forget a medication, it will not be "Called in", "Faxed", or "electronically sent". You will need to get another appointment to get these prescribed. 4. Prescription Accuracy: You are responsible for carefully inspecting your prescriptions before leaving our office. Have the discharge nurse carefully go over each prescription with you, before taking them home. Make sure that your name is accurately spelled, that your address is correct. Check the name and dose of your medication to make sure it is accurate. Check the number of pills, and the written instructions to make sure they are clear and accurate. Make sure that you are given enough medication to last  until your next medication refill appointment. 5. Taking Medication: Take medication as prescribed. Never take more pills than instructed. Never take medication more frequently than prescribed. Taking less pills or less frequently is permitted and encouraged, when it comes to controlled substances (written prescriptions).  6. Inform other Doctors: Always inform, all of your healthcare providers, of all the medications you take. 7. Pain Medication from other Providers: You are not allowed to accept any additional pain medication from any other Doctor or Healthcare provider. There are two exceptions to this rule. (see below) In the event that you require additional pain medication, you are responsible for notifying us, as stated below. 8. Medication Agreement: You are responsible for carefully reading and following our Medication Agreement. This must be signed before receiving any prescriptions from our practice. Safely store a copy of your signed Agreement. Violations to the Agreement will result in no further prescriptions. (Additional copies of our Medication Agreement are available upon request.) 9. Laws, Rules, & Regulations: All patients are expected to follow all Federal and State Laws, Statutes, Rules, & Regulations. Ignorance of the Laws does not constitute a valid excuse. The use of any illegal substances is prohibited. 10. Adopted CDC guidelines & recommendations: Target dosing levels will be at or below 60 MME/day. Use of benzodiazepines** is not recommended.  Exceptions: There are only two exceptions to the rule of not receiving pain medications from other Healthcare Providers. 1. Exception #1 (Emergencies): In the event of an emergency (i.e.: accident requiring emergency care), you are allowed to receive additional pain medication. However, you are responsible for: As soon as you are able, call our office [phone], at any time of the day or night, and leave a message stating your name, the  date and nature of the emergency, and the name and dose of the medication   prescribed. In the event that your call is answered by a member of our staff, make sure to document and save the date, time, and the name of the person that took your information.  2. Exception #2 (Planned Surgery): In the event that you are scheduled by another doctor or dentist to have any type of surgery or procedure, you are allowed (for a period no longer than 30 days), to receive additional pain medication, for the acute post-op pain. However, in this case, you are responsible for picking up a copy of our "Post-op Pain Management for Surgeons" handout, and giving it to your surgeon or dentist. This document is available at our office, and does not require an appointment to obtain it. Simply go to our office during business hours (Monday-Thursday from 8:00 AM to 4:00 PM) (Friday 8:00 AM to 12:00 Noon) or if you have a scheduled appointment with [place], prior to your surgery, and ask for it by name. In addition, you will need to provide [place] with your name, name of your surgeon, type of surgery, and date of procedure or surgery.  *Opioid medications include: morphine, codeine, oxycodone, oxymorphone, hydrocodone, hydromorphone, meperidine, tramadol, tapentadol, buprenorphine, fentanyl, methadone. **Benzodiazepine medications include: diazepam (Valium), alprazolam (Xanax), clonazepam (Klonopine), lorazepam (Ativan), clorazepate (Tranxene), chlordiazepoxide (Librium), estazolam (Prosom), oxazepam (Serax), temazepam (Restoril), triazolam (Halcion) ____________________________________________________________________________________________  ____________________________________________________________________________________________  Pain Scale  Introduction: The pain score used by this practice is the Verbal Numerical Rating Scale (VNRS-11). This is an 11-point scale. It is for adults and children 10 years or older. There are significant  differences in how the pain score is reported, used, and applied. Forget everything you learned in the past and learn this scoring system.  General Information: The scale should reflect your current level of pain. Unless you are specifically asked for the level of your worst pain, or your average pain. If you are asked for one of these two, then it should be understood that it is over the past 24 hours.  Basic Activities of Daily Living (ADL): Personal hygiene, dressing, eating, transferring, and using restroom.  Instructions: Most patients tend to report their level of pain as a combination of two factors, their physical pain and their psychosocial pain. This last one is also known as "suffering" and it is reflection of how physical pain affects you socially and psychologically. From now on, report them separately. From this point on, when asked to report your pain level, report only your physical pain. Use the following table for reference.  Pain Clinic Pain Levels (0-5/10)  Pain Level Score  Description  No Pain 0   Mild pain 1 Nagging, annoying, but does not interfere with basic activities of daily living (ADL). Patients are able to eat, bathe, get dressed, toileting (being able to get on and off the toilet and perform personal hygiene functions), transfer (move in and out of bed or a chair without assistance), and maintain continence (able to control bladder and bowel functions). Blood pressure and heart rate are unaffected. A normal heart rate for a healthy adult ranges from 60 to 100 bpm (beats per minute).   Mild to moderate pain 2 Noticeable and distracting. Impossible to hide from other people. More frequent flare-ups. Still possible to adapt and function close to normal. It can be very annoying and may have occasional stronger flare-ups. With discipline, patients may get used to it and adapt.   Moderate pain 3 Interferes significantly with activities of daily living (ADL). It becomes  difficult to  feed, bathe, get dressed, get on and off the toilet or to perform personal hygiene functions. Difficult to get in and out of bed or a chair without assistance. Very distracting. With effort, it can be ignored when deeply involved in activities.   Moderately severe pain 4 Impossible to ignore for more than a few minutes. With effort, patients may still be able to manage work or participate in some social activities. Very difficult to concentrate. Signs of autonomic nervous system discharge are evident: dilated pupils (mydriasis); mild sweating (diaphoresis); sleep interference. Heart rate becomes elevated (>115 bpm). Diastolic blood pressure (lower number) rises above 100 mmHg. Patients find relief in laying down and not moving.   Severe pain 5 Intense and extremely unpleasant. Associated with frowning face and frequent crying. Pain overwhelms the senses.  Ability to do any activity or maintain social relationships becomes significantly limited. Conversation becomes difficult. Pacing back and forth is common, as getting into a comfortable position is nearly impossible. Pain wakes you up from deep sleep. Physical signs will be obvious: pupillary dilation; increased sweating; goosebumps; brisk reflexes; cold, clammy hands and feet; nausea, vomiting or dry heaves; loss of appetite; significant sleep disturbance with inability to fall asleep or to remain asleep. When persistent, significant weight loss is observed due to the complete loss of appetite and sleep deprivation.  Blood pressure and heart rate becomes significantly elevated. Caution: If elevated blood pressure triggers a pounding headache associated with blurred vision, then the patient should immediately seek attention at an urgent or emergency care unit, as these may be signs of an impending stroke.    Emergency Department Pain Levels (6-10/10)  Emergency Room Pain 6 Severely limiting. Requires emergency care and should not be seen or  managed at an outpatient pain management facility. Communication becomes difficult and requires great effort. Assistance to reach the emergency department may be required. Facial flushing and profuse sweating along with potentially dangerous increases in heart rate and blood pressure will be evident.   Distressing pain 7 Self-care is very difficult. Assistance is required to transport, or use restroom. Assistance to reach the emergency department will be required. Tasks requiring coordination, such as bathing and getting dressed become very difficult.   Disabling pain 8 Self-care is no longer possible. At this level, pain is disabling. The individual is unable to do even the most "basic" activities such as walking, eating, bathing, dressing, transferring to a bed, or toileting. Fine motor skills are lost. It is difficult to think clearly.   Incapacitating pain 9 Pain becomes incapacitating. Thought processing is no longer possible. Difficult to remember your own name. Control of movement and coordination are lost.   The worst pain imaginable 10 At this level, most patients pass out from pain. When this level is reached, collapse of the autonomic nervous system occurs, leading to a sudden drop in blood pressure and heart rate. This in turn results in a temporary and dramatic drop in blood flow to the brain, leading to a loss of consciousness. Fainting is one of the body's self defense mechanisms. Passing out puts the brain in a calmed state and causes it to shut down for a while, in order to begin the healing process.    Summary: 1. Refer to this scale when providing [place] with your pain level. 2. Be accurate and careful when reporting your pain level. This will help with your care. 3. Over-reporting your pain level will lead to loss of credibility. 4. Even a level of 1/10  means that there is pain and will be treated at our facility. 5. High, inaccurate reporting will be documented as "Symptom  Exaggeration", leading to loss of credibility and suspicions of possible secondary gains such as obtaining more narcotics, or wanting to appear disabled, for fraudulent reasons. 6. Only pain levels of 5 or below will be seen at our facility. 7. Pain levels of 6 and above will be sent to the Emergency Department and the appointment cancelled. ____________________________________________________________________________________________    Epidural Steroid Injection An epidural steroid injection is a shot of steroid medicine and numbing medicine that is given into the space between the spinal cord and the bones in your back (epidural space). The shot helps relieve pain caused by an irritated or swollen nerve root. The amount of pain relief you get from the injection depends on what is causing the nerve to be swollen and irritated, and how long your pain lasts. You are more likely to benefit from this injection if your pain is strong and comes on suddenly rather than if you have had pain for a long time. Tell a health care provider about:  Any allergies you have.  All medicines you are taking, including vitamins, herbs, eye drops, creams, and over-the-counter medicines.  Any problems you or family members have had with anesthetic medicines.  Any blood disorders you have.  Any surgeries you have had.  Any medical conditions you have.  Whether you are pregnant or may be pregnant. What are the risks? Generally, this is a safe procedure. However, problems may occur, including:  Headache.  Bleeding.  Infection.  Allergic reaction to medicines.  Damage to your nerves.  What happens before the procedure? Staying hydrated Follow instructions from your health care provider about hydration, which may include:  Up to 2 hours before the procedure - you may continue to drink clear liquids, such as water, clear fruit juice, black coffee, and plain tea.  Eating and drinking  restrictions Follow instructions from your health care provider about eating and drinking, which may include:  8 hours before the procedure - stop eating heavy meals or foods such as meat, fried foods, or fatty foods.  6 hours before the procedure - stop eating light meals or foods, such as toast or cereal.  6 hours before the procedure - stop drinking milk or drinks that contain milk.  2 hours before the procedure - stop drinking clear liquids.  Medicine  You may be given medicines to lower anxiety.  Ask your health care provider about: ? Changing or stopping your regular medicines. This is especially important if you are taking diabetes medicines or blood thinners. ? Taking medicines such as aspirin and ibuprofen. These medicines can thin your blood. Do not take these medicines before your procedure if your health care provider instructs you not to. General instructions  Plan to have someone take you home from the hospital or clinic. What happens during the procedure?  You may receive a medicine to help you relax (sedative).  You will be asked to lie on your abdomen.  The injection site will be cleaned.  A numbing medicine (local anesthetic) will be used to numb the injection site.  A needle will be inserted through your skin into the epidural space. You may feel some discomfort when this happens. An X-ray machine will be used to make sure the needle is put as close as possible to the affected nerve.  A steroid medicine and a local anesthetic will be injected into  the epidural space.  The needle will be removed.  A bandage (dressing) will be put over the injection site. What happens after the procedure?  Your blood pressure, heart rate, breathing rate, and blood oxygen level will be monitored until the medicines you were given have worn off.  Your arm or leg may feel weak or numb for a few hours.  The injection site may feel sore.  Do not drive for 24 hours if you  received a sedative. This information is not intended to replace advice given to you by your health care provider. Make sure you discuss any questions you have with your health care provider. Document Released: 09/30/2007 Document Revised: 12/05/2015 Document Reviewed: 10/09/2015 [employer] Interactive Patient Education  [employer].

## 2017-09-01 NOTE — Progress Notes (Signed)
Safety precautions to be maintained throughout the outpatient stay will include: orient to surroundings, keep bed in low position, maintain call bell within reach at all times, provide assistance with transfer out of bed and ambulation.  

## 2017-09-09 ENCOUNTER — Ambulatory Visit (HOSPITAL_BASED_OUTPATIENT_CLINIC_OR_DEPARTMENT_OTHER): Payer: BLUE CROSS/BLUE SHIELD | Admitting: Student in an Organized Health Care Education/Training Program

## 2017-09-09 ENCOUNTER — Ambulatory Visit
Admission: RE | Admit: 2017-09-09 | Discharge: 2017-09-09 | Disposition: A | Discharge status: Home / Self Care | Payer: BLUE CROSS/BLUE SHIELD | Source: Ambulatory Visit | Attending: Student in an Organized Health Care Education/Training Program | Admitting: Student in an Organized Health Care Education/Training Program

## 2017-09-09 ENCOUNTER — Other Ambulatory Visit: Payer: Self-pay

## 2017-09-09 ENCOUNTER — Encounter: Payer: Self-pay | Admitting: Student in an Organized Health Care Education/Training Program

## 2017-09-09 VITALS — BP 109/68 | HR 86 | Temp 98.0°F | Resp 16 | Ht 60.0 in | Wt 146.0 lb

## 2017-09-09 DIAGNOSIS — M5412 Radiculopathy, cervical region: Secondary | ICD-10-CM | POA: Diagnosis not present

## 2017-09-09 MED ORDER — LACTATED RINGERS IV SOLN
1000.0000 mL | Freq: Once | INTRAVENOUS | Status: AC
  Administered 2017-09-09: 1000 mL via INTRAVENOUS

## 2017-09-09 MED ORDER — IOPAMIDOL (ISOVUE-M 200) INJECTION 41%
10.0000 mL | Freq: Once | INTRAMUSCULAR | Status: AC
  Administered 2017-09-09: 10 mL via EPIDURAL

## 2017-09-09 MED ORDER — FENTANYL CITRATE (PF) 100 MCG/2ML IJ SOLN
25.0000 ug | INTRAMUSCULAR | Status: DC | PRN
  Administered 2017-09-09: 50 ug via INTRAVENOUS

## 2017-09-09 MED ORDER — ROPIVACAINE HCL 2 MG/ML IJ SOLN
1.0000 mL | Freq: Once | INTRAMUSCULAR | Status: AC
  Administered 2017-09-09: 10 mL via EPIDURAL

## 2017-09-09 MED ORDER — SODIUM CHLORIDE 0.9 % IJ SOLN
INTRAMUSCULAR | Status: AC
  Filled 2017-09-09: qty 10

## 2017-09-09 MED ORDER — IOPAMIDOL (ISOVUE-M 200) INJECTION 41%
INTRAMUSCULAR | Status: AC
Start: 2017-09-09 — End: ?
  Filled 2017-09-09: qty 10

## 2017-09-09 MED ORDER — ROPIVACAINE HCL 2 MG/ML IJ SOLN
INTRAMUSCULAR | Status: AC
  Filled 2017-09-09: qty 10

## 2017-09-09 MED ORDER — DEXAMETHASONE SODIUM PHOSPHATE 10 MG/ML IJ SOLN
10.0000 mg | Freq: Once | INTRAMUSCULAR | Status: AC
  Administered 2017-09-09: 10 mg

## 2017-09-09 MED ORDER — SODIUM CHLORIDE 0.9% FLUSH
1.0000 mL | Freq: Once | INTRAVENOUS | Status: AC
  Administered 2017-09-09: 10 mL

## 2017-09-09 MED ORDER — DEXAMETHASONE SODIUM PHOSPHATE 10 MG/ML IJ SOLN
INTRAMUSCULAR | Status: AC
  Filled 2017-09-09: qty 1

## 2017-09-09 MED ORDER — FENTANYL CITRATE (PF) 100 MCG/2ML IJ SOLN
INTRAMUSCULAR | Status: AC
  Filled 2017-09-09: qty 2

## 2017-09-09 MED ORDER — LIDOCAINE HCL (PF) 1 % IJ SOLN
4.5000 mL | Freq: Once | INTRAMUSCULAR | Status: AC
  Administered 2017-09-09: 5 mL

## 2017-09-09 MED ORDER — LIDOCAINE HCL (PF) 1 % IJ SOLN
INTRAMUSCULAR | Status: AC
  Filled 2017-09-09: qty 5

## 2017-09-09 NOTE — Patient Instructions (Signed)

## 2017-09-09 NOTE — Progress Notes (Signed)
Patient's Name: Sarah Fisher  MRN: [id number]  Referring Provider: Ma Hillock, DO  DOB: 06/23/1961  PCP: Ma Hillock, DO  DOS: 09/09/2017  Note by: Gillis Santa, MD  Service setting: Ambulatory outpatient  Specialty: Interventional Pain Management  Patient type: Established  Location: [facility] (AMB) Pain Management Facility  Visit type: Interventional Procedure   Primary Reason for Visit: Interventional Pain Management Treatment. CC: Neck Pain (lower)  Procedure:       Anesthesia, Analgesia, Anxiolysis:  Type: Diagnostic, Inter-Laminar, Epidural Steroid Injection          Region: Posterior Cervico-thoracic Region Level: C7-T1 Laterality: Midline Paramedial with slight leftward orientation  Type: Local Anesthesia with Moderate (Conscious) Sedation Local Anesthetic: Lidocaine 1% Route: Intravenous (IV) IV Access: Secured Sedation: Meaningful verbal contact was maintained at all times during the procedure  Indication(s): Analgesia and Anxiety   Indications: 1. Chronic cervical radiculopathy (Bilateral) (L>R)    Pain Score: Pre-procedure: 5 /10 Post-procedure: 0-No pain/10  Pre-op Assessment:  Sarah Fisher is a 57 y.o. (year old), female patient, seen today for interventional treatment. She  has a past surgical history that includes Breast surgery (1986); Hand surgery (Bilateral, 2000 and 2016); cervical facet block (2010); Wisdom tooth extraction (1979); Mass excision (Left, 08/10/2017); I&D extremity (Left, 08/10/2017); and left finger surgery (Left, 08/2017). Sarah Fisher has a current medication list which includes the following prescription(s): cholecalciferol, gabapentin, glucos-chondroit-ca-mg-c-d, green tea, ibuprofen, losartan, multivitamin, omega 3, aloe 10000 & probiotics, simvastatin, tizanidine, and tramadol, and the following Facility-Administered Medications: fentanyl. Her primarily concern today is the Neck Pain (lower)  Initial Vital Signs:  Pulse Rate: 86 Temp: 98 F  (36.7 C) Resp: 13 BP: 134/82 SpO2: 98 %  5 out of 5 strength bilateral upper extremity: Shoulder abduction, elbow flexion, elbow extension, thumb extension.   BMI: Estimated body mass index is 28.51 kg/m as calculated from the following:   Height as of this encounter: 5' (1.524 m).   Weight as of this encounter: 146 lb (66.2 kg).  Risk Assessment: Allergies: Reviewed. She has No Known Allergies.  Allergy Precautions: None required Coagulopathies: Reviewed. None identified.  Blood-thinner therapy: None at this time Active Infection(s): Reviewed. None identified. Sarah Fisher is afebrile  Site Confirmation: Sarah Fisher was asked to confirm the procedure and laterality before marking the site Procedure checklist: Completed Consent: Before the procedure and under the influence of no sedative(s), amnesic(s), or anxiolytics, the patient was informed of the treatment options, risks and possible complications. To fulfill our ethical and legal obligations, as recommended by the American Medical Association's Code of Ethics, I have informed the patient of my clinical impression; the nature and purpose of the treatment or procedure; the risks, benefits, and possible complications of the intervention; the alternatives, including doing nothing; the risk(s) and benefit(s) of the alternative treatment(s) or procedure(s); and the risk(s) and benefit(s) of doing nothing. The patient was provided information about the general risks and possible complications associated with the procedure. These may include, but are not limited to: failure to achieve desired goals, infection, bleeding, organ or nerve damage, allergic reactions, paralysis, and death. In addition, the patient was informed of those risks and complications associated to Spine-related procedures, such as failure to decrease pain; infection (i.e.: Meningitis, epidural or intraspinal abscess); bleeding (i.e.: epidural hematoma, subarachnoid  hemorrhage, or any other type of intraspinal or peri-dural bleeding); organ or nerve damage (i.e.: Any type of peripheral nerve, nerve root, or spinal cord injury) with subsequent damage to sensory, motor,  and/or autonomic systems, resulting in permanent pain, numbness, and/or weakness of one or several areas of the body; allergic reactions; (i.e.: anaphylactic reaction); and/or death. Furthermore, the patient was informed of those risks and complications associated with the medications. These include, but are not limited to: allergic reactions (i.e.: anaphylactic or anaphylactoid reaction(s)); adrenal axis suppression; blood sugar elevation that in diabetics may result in ketoacidosis or comma; water retention that in patients with history of congestive heart failure may result in shortness of breath, pulmonary edema, and decompensation with resultant heart failure; weight gain; swelling or edema; medication-induced neural toxicity; particulate matter embolism and blood vessel occlusion with resultant organ, and/or nervous system infarction; and/or aseptic necrosis of one or more joints. Finally, the patient was informed that Medicine is not an exact science; therefore, there is also the possibility of unforeseen or unpredictable risks and/or possible complications that may result in a catastrophic outcome. The patient indicated having understood very clearly. We have given the patient no guarantees and we have made no promises. Enough time was given to the patient to ask questions, all of which were answered to the patient's satisfaction. Sarah Fisher has indicated that she wanted to continue with the procedure. Attestation: I, the ordering provider, attest that I have discussed with the patient the benefits, risks, side-effects, alternatives, likelihood of achieving goals, and potential problems during recovery for the procedure that I have provided informed consent. Date  Time: 09/09/2017  9:44  AM  Pre-Procedure Preparation:  Monitoring: As per clinic protocol. Respiration, ETCO2, SpO2, BP, heart rate and rhythm monitor placed and checked for adequate function Safety Precautions: Patient was assessed for positional comfort and pressure points before starting the procedure. Time-out: I initiated and conducted the "Time-out" before starting the procedure, as per protocol. The patient was asked to participate by confirming the accuracy of the "Time Out" information. Verification of the correct person, site, and procedure were performed and confirmed by me, the nursing staff, and the patient. "Time-out" conducted as per Joint Commission's Universal Protocol (UP.01.01.01). Time: 1032  Description of Procedure Process:   Position: Prone with head of the table was raised to facilitate breathing. Target Area: For Epidural Steroid injections the target is the interlaminar space, initially targeting the lower border of the superior vertebral body lamina. Approach: Paramedial approach. Area Prepped: Entire PosteriorCervical Region Prepping solution: ChloraPrep (2% chlorhexidine gluconate and 70% isopropyl alcohol) Safety Precautions: Aspiration looking for blood return was conducted prior to all injections. At no point did we inject any substances, as a needle was being advanced. No attempts were made at seeking any paresthesias. Safe injection practices and needle disposal techniques used. Medications properly checked for expiration dates. SDV (single dose vial) medications used. Description of the Procedure: Protocol guidelines were followed. The procedure needle was introduced through the skin, ipsilateral to the reported pain, and advanced to the target area. Bone was contacted and the needle walked caudad, until the lamina was cleared. The epidural space was identified using "loss-of-resistance technique" with 2-3 ml of PF-NaCl (0.9% NSS), in a 5cc LOR glass syringe. Vitals:   09/09/17 1046  09/09/17 1054 09/09/17 1103 09/09/17 1113  BP: (!) 141/100 132/69 137/74 109/68  Pulse:      Resp: 16  16 16   Temp:      SpO2: 96% 97% 98% 98%  Weight:      Height:        Start Time: 1033 hrs. End Time: 1046 hrs. Materials:  Needle(s) Type: Epidural needle Gauge: 17G  Length: 3.5-in Medication(s): Please see orders for medications and dosing details. 4 cc solution made of 2.5 cc of preservative-free saline, 0.5 cc of 0.2% ropivacaine, 1 cc of Decadron 10 mg/cc. Imaging Guidance (Spinal):  Type of Imaging Technique: Fluoroscopy Guidance (Spinal) Indication(s): Assistance in needle guidance and placement for procedures requiring needle placement in or near specific anatomical locations not easily accessible without such assistance. Exposure Time: Please see nurses notes. Contrast: Before injecting any contrast, we confirmed that the patient did not have an allergy to iodine, shellfish, or radiological contrast. Once satisfactory needle placement was completed at the desired level, radiological contrast was injected. Contrast injected under live fluoroscopy. No contrast complications. See chart for type and volume of contrast used. Fluoroscopic Guidance: I was personally present during the use of fluoroscopy. "Tunnel Vision Technique" used to obtain the best possible view of the target area. Parallax error corrected before commencing the procedure. "Direction-depth-direction" technique used to introduce the needle under continuous pulsed fluoroscopy. Once target was reached, antero-posterior, oblique, and lateral fluoroscopic projection used confirm needle placement in all planes. Images permanently stored in EMR. Interpretation: I personally interpreted the imaging intraoperatively. Adequate needle placement confirmed in multiple planes. Appropriate spread of contrast into desired area was observed. No evidence of afferent or efferent intravascular uptake. No intrathecal or subarachnoid spread  observed. Permanent images saved into the patient's record.  Antibiotic Prophylaxis:   Anti-infectives (From admission, onward)   None     Indication(s): None identified  Post-operative Assessment:  Post-procedure Vital Signs:  Pulse Rate: 86 Temp: 98 F (36.7 C) Resp: 16 BP: 109/68 SpO2: 98 %  EBL: None  Complications: No immediate post-treatment complications observed by team, or reported by patient.  Note: The patient tolerated the entire procedure well. A repeat set of vitals were taken after the procedure and the patient was kept under observation following institutional policy, for this type of procedure. Post-procedural neurological assessment was performed, showing return to baseline, prior to discharge. The patient was provided with post-procedure discharge instructions, including a section on how to identify potential problems. Should any problems arise concerning this procedure, the patient was given instructions to immediately contact us, at any time, without hesitation. In any case, we plan to contact the patient by telephone for a follow-up status report regarding this interventional procedure.  Comments:  No additional relevant information.  5 out of 5 strength bilateral upper extremity: Shoulder abduction, elbow flexion, elbow extension, thumb extension.  Plan of Care    Imaging Orders     DG C-Arm 1-60 Min-No Report Procedure Orders    No procedure(s) ordered today    Medications ordered for procedure: Meds ordered this encounter  Medications  . lactated ringers infusion 1,000 mL  . fentaNYL (SUBLIMAZE) injection 25-100 mcg    Make sure Narcan is available in the pyxis when using this medication. In the event of respiratory depression (RR< 8/min): Titrate NARCAN (naloxone) in increments of 0.1 to 0.2 mg IV at 2-3 minute intervals, until desired degree of reversal.  . iopamidol (ISOVUE-M) 41 % intrathecal injection 10 mL  . dexamethasone (DECADRON)  injection 10 mg  . ropivacaine (PF) 2 mg/mL (0.2%) (NAROPIN) injection 1 mL  . sodium chloride flush (NS) 0.9 % injection 1 mL  . lidocaine (PF) (XYLOCAINE) 1 % injection 4.5 mL   Medications administered: We administered lactated ringers, fentaNYL, iopamidol, dexamethasone, ropivacaine (PF) 2 mg/mL (0.2%), sodium chloride flush, and lidocaine (PF).  See the medical record for exact dosing, route, and  time of administration.  New Prescriptions   No medications on file   Disposition: Discharge home  Discharge Date & Time: 09/09/2017; 1115 hrs.   Physician-requested Follow-up: Return in about 3 weeks (around 09/30/2017) for Post Procedure Evaluation.  Future Appointments  Date Time Provider [place]  09/28/2017  9:30 AM Gillis Santa, MD [facility] None  02/24/2018  9:45 AM Vevelyn Francois, NP [facility] None   Primary Care Physician: Ma Hillock, DO Location: [facility] Note by: Gillis Santa, MD Date: 09/09/2017; Time: 12:52 PM  Disclaimer:  Medicine is not an exact science. The only guarantee in medicine is that nothing is guaranteed. It is important to note that the decision to proceed with this intervention was based on the information collected from the patient. The Data and conclusions were drawn from the patient's questionnaire, the interview, and the physical examination. Because the information was provided in large part by the patient, it cannot be guaranteed that it has not been purposely or unconsciously manipulated. Every effort has been made to obtain as much relevant data as possible for this evaluation. It is important to note that the conclusions that lead to this procedure are derived in large part from the available data. Always take into account that the treatment will also be dependent on availability of resources and existing treatment guidelines, considered by other Pain Management Practitioners as being common knowledge and practice,  at the time of the intervention. For Medico-Legal purposes, it is also important to point out that variation in procedural techniques and pharmacological choices are the acceptable norm. The indications, contraindications, technique, and results of the above procedure should only be interpreted and judged by a Board-Certified Interventional Pain Specialist with extensive familiarity and expertise in the same exact procedure and technique.

## 2017-09-10 ENCOUNTER — Telehealth: Payer: Self-pay

## 2017-09-10 NOTE — Telephone Encounter (Signed)
Post procedure phone call. Patient states she is doing good.  

## 2017-09-21 ENCOUNTER — Ambulatory Visit: Payer: BLUE CROSS/BLUE SHIELD | Admitting: Student in an Organized Health Care Education/Training Program

## 2017-09-28 ENCOUNTER — Encounter: Payer: Self-pay | Admitting: Student in an Organized Health Care Education/Training Program

## 2017-09-28 ENCOUNTER — Other Ambulatory Visit: Payer: Self-pay

## 2017-09-28 ENCOUNTER — Ambulatory Visit
Payer: BLUE CROSS/BLUE SHIELD | Attending: Student in an Organized Health Care Education/Training Program | Admitting: Student in an Organized Health Care Education/Training Program

## 2017-09-28 VITALS — BP 114/80 | HR 77 | Temp 97.9°F | Ht 60.0 in | Wt 146.0 lb

## 2017-09-28 DIAGNOSIS — D649 Anemia, unspecified: Secondary | ICD-10-CM | POA: Diagnosis not present

## 2017-09-28 DIAGNOSIS — Z79899 Other long term (current) drug therapy: Secondary | ICD-10-CM | POA: Insufficient documentation

## 2017-09-28 DIAGNOSIS — I10 Essential (primary) hypertension: Secondary | ICD-10-CM | POA: Diagnosis not present

## 2017-09-28 DIAGNOSIS — M4802 Spinal stenosis, cervical region: Secondary | ICD-10-CM

## 2017-09-28 DIAGNOSIS — M170 Bilateral primary osteoarthritis of knee: Secondary | ICD-10-CM | POA: Diagnosis not present

## 2017-09-28 DIAGNOSIS — Z8261 Family history of arthritis: Secondary | ICD-10-CM | POA: Insufficient documentation

## 2017-09-28 DIAGNOSIS — R51 Headache: Secondary | ICD-10-CM | POA: Diagnosis not present

## 2017-09-28 DIAGNOSIS — G8929 Other chronic pain: Secondary | ICD-10-CM | POA: Diagnosis not present

## 2017-09-28 DIAGNOSIS — E559 Vitamin D deficiency, unspecified: Secondary | ICD-10-CM | POA: Diagnosis not present

## 2017-09-28 DIAGNOSIS — M5412 Radiculopathy, cervical region: Secondary | ICD-10-CM

## 2017-09-28 DIAGNOSIS — K5909 Other constipation: Secondary | ICD-10-CM | POA: Diagnosis not present

## 2017-09-28 DIAGNOSIS — Z8051 Family history of malignant neoplasm of kidney: Secondary | ICD-10-CM | POA: Insufficient documentation

## 2017-09-28 DIAGNOSIS — Z8249 Family history of ischemic heart disease and other diseases of the circulatory system: Secondary | ICD-10-CM | POA: Insufficient documentation

## 2017-09-28 DIAGNOSIS — Z79891 Long term (current) use of opiate analgesic: Secondary | ICD-10-CM | POA: Insufficient documentation

## 2017-09-28 DIAGNOSIS — M419 Scoliosis, unspecified: Secondary | ICD-10-CM | POA: Insufficient documentation

## 2017-09-28 DIAGNOSIS — Z9889 Other specified postprocedural states: Secondary | ICD-10-CM | POA: Diagnosis not present

## 2017-09-28 DIAGNOSIS — M542 Cervicalgia: Secondary | ICD-10-CM

## 2017-09-28 DIAGNOSIS — E785 Hyperlipidemia, unspecified: Secondary | ICD-10-CM | POA: Insufficient documentation

## 2017-09-28 DIAGNOSIS — Z791 Long term (current) use of non-steroidal anti-inflammatories (NSAID): Secondary | ICD-10-CM | POA: Diagnosis not present

## 2017-09-28 DIAGNOSIS — M792 Neuralgia and neuritis, unspecified: Secondary | ICD-10-CM | POA: Diagnosis not present

## 2017-09-28 DIAGNOSIS — G894 Chronic pain syndrome: Secondary | ICD-10-CM | POA: Diagnosis not present

## 2017-09-28 DIAGNOSIS — G5600 Carpal tunnel syndrome, unspecified upper limb: Secondary | ICD-10-CM | POA: Insufficient documentation

## 2017-09-28 DIAGNOSIS — Z833 Family history of diabetes mellitus: Secondary | ICD-10-CM | POA: Diagnosis not present

## 2017-09-28 DIAGNOSIS — M7918 Myalgia, other site: Secondary | ICD-10-CM | POA: Insufficient documentation

## 2017-09-28 DIAGNOSIS — M069 Rheumatoid arthritis, unspecified: Secondary | ICD-10-CM | POA: Insufficient documentation

## 2017-09-28 DIAGNOSIS — Z800 Family history of malignant neoplasm of digestive organs: Secondary | ICD-10-CM | POA: Diagnosis not present

## 2017-09-28 NOTE — Progress Notes (Signed)
Patient's Name: Sarah Fisher  MRN: [id number]  Referring Provider: Ma Hillock, DO  DOB: Nov 04, 1960  PCP: Sarah Hillock, DO  DOS: 09/28/2017  Note by: Sarah Santa, MD  Service setting: Ambulatory outpatient  Specialty: Interventional Pain Management  Location: [facility] (AMB) Pain Management Facility    Patient type: Established   Primary Reason(s) for Visit: Encounter for post-procedure evaluation of chronic illness with mild to moderate exacerbation CC: Neck Pain (pain on the left )  HPI  Sarah Fisher is a 57 y.o. year old, female patient, who comes today for a post-procedure evaluation. She has Hypertension; Hyperlipidemia; Overweight; Vitamin D deficiency; Benign paroxysmal positional vertigo of left ear; Adenomatous polyp of colon; Chronic pain syndrome; Osteoarthritis of cervical spine; Cervical DDD (degenerative disc disease) (C5-6); Cervical central spinal stenosis C5-6 (Right); Chronic neck pain (Location of Primary Source of Pain) (Bilateral) (L>R); Cervicogenic headache (Bilateral) (R>L); Occipital headache (Bilateral) (R>L); Neurogenic pain; Myofascial pain; Chronic shoulder pain (Location of Tertiary source of pain) (Bilateral) (L>R); Chronic upper back pain (Location of Secondary source of pain) (Bilateral) (L>R); Chronic low back pain (Bilateral) (L>R); Cervical spondylosis; Numbness of upper extremity (Bilateral) (L>R); Chronic cervical radiculopathy (Bilateral) (L>R); Chronic knee pain (Bilateral) (R>L); Osteoarthritis of knee (Bilateral) (R>L); Rheumatoid arthritis, seronegative, shoulder region (Bilateral) (L>R); Chronic idiopathic constipation; and Long term (current) use of opiate analgesic on their problem list. Her primarily concern today is the Neck Pain (pain on the left )  Pain Assessment: Location: Right, Left Neck Radiating: Radiates down to upper arm on the left, right shoulder Onset: More than a month ago Duration: Chronic pain Quality: Tightness, Pressure,  Constant Severity: 5 /10 (self-reported pain score)  Note: Reported level is compatible with observation.                         When using our objective Pain Scale, levels between 6 and 10/10 are said to belong in an emergency room, as it progressively worsens from a 6/10, described as severely limiting, requiring emergency care not usually available at an outpatient pain management facility. At a 6/10 level, communication becomes difficult and requires great effort. Assistance to reach the emergency department may be required. Facial flushing and profuse sweating along with potentially dangerous increases in heart rate and blood pressure will be evident. Effect on ADL:   Timing: Constant Modifying factors: advil, heating pad, warm bath  Sarah Fisher comes in today for post-procedure evaluation after the treatment done on 09/09/2017.  Improved pain in left arm after cervical ESI, not extending as distally to finger tips.  States that intensity of her pain as well as duration of her pain has improved after the cervical epidural steroid injection.  Further details on both, my assessment(s), as well as the proposed treatment plan, please see below.  Post-Procedure Assessment  09/09/2017 Procedure: Left cervical ESI C7-T11 Pre-procedure pain score:  5/10 Post-procedure pain score: 0/10         Influential Factors: BMI: 28.51 kg/m Intra-procedural challenges: None observed.         Assessment challenges: None detected.              Reported side-effects: None.        Post-procedural adverse reactions or complications: None reported         Sedation: Please see nurses note. When no sedatives are used, the analgesic levels obtained are directly associated to the effectiveness of the local anesthetics. However, when sedation is provided,  the level of analgesia obtained during the initial 1 hour following the intervention, is believed to be the result of a combination of factors. These factors may  include, but are not limited to: 1. The effectiveness of the local anesthetics used. 2. The effects of the analgesic(s) and/or anxiolytic(s) used. 3. The degree of discomfort experienced by the patient at the time of the procedure. 4. The patients ability and reliability in recalling and recording the events. 5. The presence and influence of possible secondary gains and/or psychosocial factors. Reported result: Relief experienced during the 1st hour after the procedure: 100 % (Ultra-Short Term Relief)            Interpretative annotation: Clinically appropriate result. Analgesia during this period is likely to be Local Anesthetic and/or IV Sedative (Analgesic/Anxiolytic) related.          Effects of local anesthetic: The analgesic effects attained during this period are directly associated to the localized infiltration of local anesthetics and therefore cary significant diagnostic value as to the etiological location, or anatomical origin, of the pain. Expected duration of relief is directly dependent on the pharmacodynamics of the local anesthetic used. Long-acting (4-6 hours) anesthetics used.  Reported result: Relief during the next 4 to 6 hour after the procedure: 40 % (Short-Term Relief)            Interpretative annotation: Clinically appropriate result. Analgesia during this period is likely to be Local Anesthetic-related.          Long-term benefit: Defined as the period of time past the expected duration of local anesthetics (1 hour for short-acting and 4-6 hours for long-acting). With the possible exception of prolonged sympathetic blockade from the local anesthetics, benefits during this period are typically attributed to, or associated with, other factors such as analgesic sensory neuropraxia, antiinflammatory effects, or beneficial biochemical changes provided by agents other than the local anesthetics.  Reported result: Extended relief following procedure: 50 % (Long-Term Relief)             Interpretative annotation: Clinically appropriate result. Good relief. No permanent benefit expected. Inflammation plays a part in the etiology to the pain.          Current benefits: Defined as reported results that persistent at this point in time.   Analgesia: 25-50 % Ms. Petros reports that both, extremity and the axial pain improved with the treatment. Function: Somewhat improved ROM: Somewhat improved Interpretative annotation: Recurrence of symptoms. No permanent benefit expected. Effective diagnostic intervention.          Interpretation: Results would suggest a successful diagnostic intervention. We'll proceed with diagnostic intervention #2, as soon as convenient          Plan:  Please see "Plan of Care" for details.                Laboratory Chemistry  Inflammation Markers (CRP: Acute Phase) (ESR: Chronic Phase) Lab Results  Component Value Date   CRP <0.8 10/01/2016   ESRSEDRATE 21 10/01/2016                         Rheumatology Markers Lab Results  Component Value Date   RF <10.0 10/01/2016   ANA Negative 10/01/2016                Renal Function Markers Lab Results  Component Value Date   BUN 18 06/10/2017   CREATININE 0.67 06/10/2017   GFRAA >60 10/01/2016   GFRNONAA >60  10/01/2016                 Hepatic Function Markers Lab Results  Component Value Date   AST 17 06/10/2017   ALT 16 06/10/2017   ALBUMIN 4.7 06/10/2017   ALKPHOS 63 06/10/2017   HCVAB NEGATIVE 05/15/2016                 Electrolytes Lab Results  Component Value Date   NA 142 06/10/2017   K 4.2 06/10/2017   CL 103 06/10/2017   CALCIUM 9.6 06/10/2017   MG 2.2 10/01/2016                        Neuropathy Markers Lab Results  Component Value Date   QIHKVQQV95 638 10/01/2016   HGBA1C 5.4 06/10/2017   HIV NONREACTIVE 05/15/2016                 Bone Pathology Markers Lab Results  Component Value Date   VD25OH 27.56 (L) 06/10/2017   25OHVITD1 40 10/01/2016   25OHVITD2 18  10/01/2016   25OHVITD3 22 10/01/2016                         Coagulation Parameters Lab Results  Component Value Date   PLT 322.0 06/10/2017                 Cardiovascular Markers Lab Results  Component Value Date   HGB 12.7 06/10/2017   HCT 38.8 06/10/2017                 CA Markers No results found for: CEA, CA125, LABCA2               Note: Lab results reviewed.  Recent Diagnostic Imaging Results  DG C-Arm 1-60 Min-No Report Fluoroscopy was utilized by the requesting physician.  No radiographic  interpretation.   Complexity Note: Imaging results reviewed. Results shared with Ms. Lesinski, using Layman's terms.                         Meds   Current Outpatient Medications:  .  cholecalciferol (VITAMIN D) 1000 units tablet, Take 1,000 Units by mouth daily., Disp: , Rfl:  .  gabapentin (NEURONTIN) 100 MG capsule, Take 1-3 capsules (100-300 mg total) by mouth 4 (four) times daily., Disp: 120 capsule, Rfl: 5 .  GLUCOS-CHONDROIT-CA-MG-C-D PO, Take by mouth daily., Disp: , Rfl:  .  Green Tea 315 MG CAPS, Take by mouth daily., Disp: , Rfl:  .  ibuprofen (ADVIL,MOTRIN) 200 MG tablet, Take 400 mg by mouth every 4 (four) hours as needed. , Disp: , Rfl:  .  losartan (COZAAR) 50 MG tablet, Take 1 tablet (50 mg total) by mouth daily., Disp: 90 tablet, Rfl: 1 .  Multiple Vitamin (MULTIVITAMIN) LIQD, Take 5 mLs by mouth daily., Disp: , Rfl:  .  Omega 3 1000 MG CAPS, Take 1 capsule by mouth daily., Disp: , Rfl:  .  Probiotic Product (ALOE 75643 & PROBIOTICS) CAPS, Take by mouth., Disp: , Rfl:  .  simvastatin (ZOCOR) 10 MG tablet, Take 1 tablet (10 mg total) by mouth daily., Disp: 90 tablet, Rfl: 1 .  tizanidine (ZANAFLEX) 2 MG capsule, Take 1 capsule (2 mg total) by mouth 3 (three) times daily as needed for muscle spasms., Disp: 90 capsule, Rfl: 5 .  traMADol (ULTRAM) 50 MG tablet, Take 1 tablet (50 mg total)  by mouth every 6 (six) hours as needed for severe pain., Disp: 120 tablet, Rfl:  2  ROS  Constitutional: Denies any fever or chills Gastrointestinal: No reported hemesis, hematochezia, vomiting, or acute GI distress Musculoskeletal: Denies any acute onset joint swelling, redness, loss of ROM, or weakness Neurological: No reported episodes of acute onset apraxia, aphasia, dysarthria, agnosia, amnesia, paralysis, loss of coordination, or loss of consciousness  Allergies  Ms. Burkemper has No Known Allergies.  [place]  Drug: Ms. Schlauch  reports that she does not use drugs. Alcohol:  reports that she does not drink alcohol. Tobacco:  reports that she has never smoked. She has never used smokeless tobacco. Medical:  has a past medical history of Anemia, Arthritis, Cervical spine degeneration (2010), History of carpal tunnel syndrome, Hyperlipidemia, Hypertension, Sacroiliac joint pain (2010), Scoliosis, and Skin cancer of eyelid. Surgical: Ms. Sproule  has a past surgical history that includes Breast surgery (1986); Hand surgery (Bilateral, 2000 and 2016); cervical facet block (2010); Wisdom tooth extraction (1979); Mass excision (Left, 08/10/2017); I&D extremity (Left, 08/10/2017); and left finger surgery (Left, 08/2017). Family: family history includes Alzheimer's disease in her maternal grandfather and paternal grandmother; Arthritis in her maternal aunt and mother; Colon cancer (age of onset: 15) in her maternal grandfather; Diabetes in her maternal grandfather; Hypertension in her father, maternal grandmother, mother, and sister; Kidney cancer in her maternal grandmother; Liver cancer in her father.  Constitutional Exam  General appearance: Well nourished, well developed, and well hydrated. In no apparent acute distress Vitals:   09/28/17 0945  BP: 114/80  Pulse: 77  Temp: 97.9 F (36.6 C)  SpO2: 99%  Weight: 146 lb (66.2 kg)  Height: 5' (1.524 m)   BMI Assessment: Estimated body mass index is 28.51 kg/m as calculated from the following:   Height as of this encounter: 5'  (1.524 m).   Weight as of this encounter: 146 lb (66.2 kg).  BMI interpretation table: BMI level Category Range association with higher incidence of chronic pain  <18 kg/m2 Underweight   18.5-24.9 kg/m2 Ideal body weight   25-29.9 kg/m2 Overweight Increased incidence by 20%  30-34.9 kg/m2 Obese (Class I) Increased incidence by 68%  35-39.9 kg/m2 Severe obesity (Class II) Increased incidence by 136%  >40 kg/m2 Extreme obesity (Class III) Increased incidence by 254%   BMI Readings from Last 4 Encounters:  09/28/17 28.51 kg/m  09/09/17 28.51 kg/m  09/01/17 28.51 kg/m  08/10/17 28.12 kg/m   Wt Readings from Last 4 Encounters:  09/28/17 146 lb (66.2 kg)  09/09/17 146 lb (66.2 kg)  09/01/17 146 lb (66.2 kg)  08/10/17 144 lb (65.3 kg)  Psych/Mental status: Alert, oriented x 3 (person, place, & time)       Eyes: PERLA Respiratory: No evidence of acute respiratory distress  Cervical Spine Area Exam  Skin & Axial Inspection: No masses, redness, edema, swelling, or associated skin lesions Alignment: Symmetrical Functional ROM: Decreased ROM, bilaterally left greater than right Stability: No instability detected Muscle Tone/Strength: Functionally intact. No obvious neuro-muscular anomalies detected. Sensory (Neurological): Dermatomal pain pattern left greater than right Palpation: Complains of area being tender to palpation              Upper Extremity (UE) Exam    Side: Right upper extremity  Side: Left upper extremity  Skin & Extremity Inspection: Skin color, temperature, and hair growth are WNL. No peripheral edema or cyanosis. No masses, redness, swelling, asymmetry, or associated skin lesions. No contractures.  Skin &  Extremity Inspection: Skin color, temperature, and hair growth are WNL. No peripheral edema or cyanosis. No masses, redness, swelling, asymmetry, or associated skin lesions. No contractures.  Functional ROM: Unrestricted ROM          Functional ROM: Decreased ROM for  shoulder and elbow 2/2 to pain  Muscle Tone/Strength: Functionally intact. No obvious neuro-muscular anomalies detected.  Muscle Tone/Strength: Functionally intact. No obvious neuro-muscular anomalies detected.  Sensory (Neurological): Dermatomal pain pattern          Sensory (Neurological): Dermatomal pain pattern          Palpation: No palpable anomalies              Palpation: No palpable anomalies              Specialized Test(s): Deferred         Specialized Test(s): Deferred          Thoracic Spine Area Exam  Skin & Axial Inspection: No masses, redness, or swelling Alignment: Symmetrical Functional ROM: Unrestricted ROM Stability: No instability detected Muscle Tone/Strength: Functionally intact. No obvious neuro-muscular anomalies detected. Sensory (Neurological): Unimpaired Muscle strength & Tone: No palpable anomalies  Lumbar Spine Area Exam  Skin & Axial Inspection: No masses, redness, or swelling Alignment: Symmetrical Functional ROM: Unrestricted ROM      Stability: No instability detected Muscle Tone/Strength: Functionally intact. No obvious neuro-muscular anomalies detected. Sensory (Neurological): Unimpaired Palpation: No palpable anomalies       Provocative Tests: Lumbar Hyperextension and rotation test: evaluation deferred today       Lumbar Lateral bending test: evaluation deferred today       Patrick's Maneuver: evaluation deferred today                    Gait & Posture Assessment  Ambulation: Unassisted Gait: Relatively normal for age and body habitus Posture: WNL   Lower Extremity Exam    Side: Right lower extremity  Side: Left lower extremity  Skin & Extremity Inspection: Skin color, temperature, and hair growth are WNL. No peripheral edema or cyanosis. No masses, redness, swelling, asymmetry, or associated skin lesions. No contractures.  Skin & Extremity Inspection: Skin color, temperature, and hair growth are WNL. No peripheral edema or cyanosis. No masses,  redness, swelling, asymmetry, or associated skin lesions. No contractures.  Functional ROM: Unrestricted ROM          Functional ROM: Unrestricted ROM          Muscle Tone/Strength: Functionally intact. No obvious neuro-muscular anomalies detected.  Muscle Tone/Strength: Functionally intact. No obvious neuro-muscular anomalies detected.  Sensory (Neurological): Unimpaired  Sensory (Neurological): Unimpaired  Palpation: No palpable anomalies  Palpation: No palpable anomalies   Assessment  Primary Diagnosis & Pertinent Problem List: The primary encounter diagnosis was Chronic cervical radiculopathy (Bilateral) (L>R). Diagnoses of Cervical central spinal stenosis C5-6 (Right), Chronic neck pain (Location of Primary Source of Pain) (Bilateral) (L>R), Neurogenic pain, and Chronic pain syndrome were also pertinent to this visit.  Status Diagnosis  Responding Persistent Responding 1. Chronic cervical radiculopathy (Bilateral) (L>R)   2. Cervical central spinal stenosis C5-6 (Right)   3. Chronic neck pain (Location of Primary Source of Pain) (Bilateral) (L>R)   4. Neurogenic pain   5. Chronic pain syndrome      57 year old female with a history of chronic cervical radiculopathy, left greater than right who presents status post left C7-T1 epidural steroid injection who notes significant benefit after her epidural steroid injection.  She notes  improvement in her range of motion to the left as well as a decrease in the intensity and duration of her left upper extremity radicular pain.  She states that her pain radiation does not extend this distally to her hands anymore and usually stops at her biceps.  Given that the patient had a positive diagnostic response from her left C7-T1 ESI, we discussed repeating the injection.  Risks and benefits were discussed and patient would like to proceed.  Plan: -Left C7-T1 epidural steroid injection #2 with sedation for cervical radiculopathy  Orders Placed This  Encounter  Procedures  . Cervical Epidural Injection   Provider-requested follow-up: Return in about 1 month (around 10/26/2017) for Procedure. Time Note: Greater than 50% of the 25 minute(s) of face-to-face time spent with Ms. Marrufo, was spent in counseling/coordination of care regarding: the treatment plan, treatment alternatives, the risks and possible complications of proposed treatment, going over the informed consent, the results, interpretation and significance of  her recent diagnostic interventional treatment(s) and realistic expectations. Future Appointments  Date Time Provider [place]  10/26/2017  8:45 AM Sarah Santa, MD [facility] None  02/24/2018  9:45 AM Vevelyn Francois, NP [facility] None    Primary Care Physician: Sarah Hillock, DO Location: [facility] Note by: Sarah Fisher, M.D Date: 09/28/2017; Time: 10:47 AM  There are no Patient Instructions on file for this visit.

## 2017-09-29 ENCOUNTER — Telehealth: Payer: Self-pay | Admitting: *Deleted

## 2017-09-29 NOTE — Telephone Encounter (Signed)
Did not get procedure yesterday.

## 2017-09-30 ENCOUNTER — Ambulatory Visit: Payer: BLUE CROSS/BLUE SHIELD | Admitting: Student in an Organized Health Care Education/Training Program

## 2017-10-26 ENCOUNTER — Ambulatory Visit: Payer: BLUE CROSS/BLUE SHIELD | Admitting: Student in an Organized Health Care Education/Training Program

## 2017-11-02 ENCOUNTER — Encounter: Payer: Self-pay | Admitting: Student in an Organized Health Care Education/Training Program

## 2017-11-02 ENCOUNTER — Ambulatory Visit (HOSPITAL_BASED_OUTPATIENT_CLINIC_OR_DEPARTMENT_OTHER): Payer: BLUE CROSS/BLUE SHIELD | Admitting: Student in an Organized Health Care Education/Training Program

## 2017-11-02 ENCOUNTER — Ambulatory Visit
Admission: RE | Admit: 2017-11-02 | Discharge: 2017-11-02 | Disposition: A | Discharge status: Home / Self Care | Payer: BLUE CROSS/BLUE SHIELD | Source: Ambulatory Visit | Attending: Student in an Organized Health Care Education/Training Program | Admitting: Student in an Organized Health Care Education/Training Program

## 2017-11-02 VITALS — BP 129/81 | HR 52 | Resp 16 | Ht 60.0 in | Wt 150.0 lb

## 2017-11-02 DIAGNOSIS — M5412 Radiculopathy, cervical region: Secondary | ICD-10-CM | POA: Insufficient documentation

## 2017-11-02 DIAGNOSIS — M4802 Spinal stenosis, cervical region: Secondary | ICD-10-CM | POA: Insufficient documentation

## 2017-11-02 DIAGNOSIS — M25512 Pain in left shoulder: Secondary | ICD-10-CM | POA: Diagnosis not present

## 2017-11-02 DIAGNOSIS — M542 Cervicalgia: Secondary | ICD-10-CM | POA: Diagnosis not present

## 2017-11-02 MED ORDER — IOPAMIDOL (ISOVUE-M 200) INJECTION 41%
10.0000 mL | Freq: Once | INTRAMUSCULAR | Status: AC
Start: 2017-11-02 — End: 2017-11-02
  Administered 2017-11-02: 10 mL via EPIDURAL
  Filled 2017-11-02: qty 10

## 2017-11-02 MED ORDER — ROPIVACAINE HCL 2 MG/ML IJ SOLN
1.0000 mL | Freq: Once | INTRAMUSCULAR | Status: AC
  Administered 2017-11-02: 10 mL via EPIDURAL
  Filled 2017-11-02: qty 10

## 2017-11-02 MED ORDER — DEXAMETHASONE SODIUM PHOSPHATE 10 MG/ML IJ SOLN
10.0000 mg | Freq: Once | INTRAMUSCULAR | Status: AC
  Administered 2017-11-02: 10 mg
  Filled 2017-11-02: qty 1

## 2017-11-02 MED ORDER — LACTATED RINGERS IV SOLN
1000.0000 mL | Freq: Once | INTRAVENOUS | Status: AC
  Administered 2017-11-02: 1000 mL via INTRAVENOUS

## 2017-11-02 MED ORDER — GABAPENTIN 300 MG PO CAPS: ORAL_CAPSULE | ORAL | 2 refills | Status: DC

## 2017-11-02 MED ORDER — FENTANYL CITRATE (PF) 100 MCG/2ML IJ SOLN
25.0000 ug | INTRAMUSCULAR | Status: DC | PRN
  Administered 2017-11-02: 25 ug via INTRAVENOUS
  Filled 2017-11-02: qty 2

## 2017-11-02 MED ORDER — LIDOCAINE HCL (PF) 1 % IJ SOLN
4.5000 mL | Freq: Once | INTRAMUSCULAR | Status: AC
  Administered 2017-11-02: 5 mL
  Filled 2017-11-02: qty 5

## 2017-11-02 MED ORDER — SODIUM CHLORIDE 0.9% FLUSH
1.0000 mL | Freq: Once | INTRAVENOUS | Status: AC
Start: 2017-11-02 — End: 2017-11-02
  Administered 2017-11-02: 10 mL

## 2017-11-02 NOTE — Progress Notes (Signed)
Patient's Name: Sarah Fisher  MRN: [id number]  Referring Provider: Ma Hillock, DO  DOB: [birth year]  PCP: Ma Hillock, DO  DOS: 11/02/2017  Note by: Gillis Santa, MD  Service setting: Ambulatory outpatient  Specialty: Interventional Pain Management  Patient type: Established  Location: [facility] (AMB) Pain Management Facility  Visit type: Interventional Procedure   Primary Reason for Visit: Interventional Pain Management Treatment. CC: Shoulder Pain (left) and Neck Pain (left is worse than the right)  Procedure:       Anesthesia, Analgesia, Anxiolysis:  Type: Diagnostic, Inter-Laminar, Epidural Steroid Injection #2  Region: Posterior Cervico-thoracic Region Level: C7-T1 Laterality: Midline Paramedial with slight leftward orientation  Type: Local Anesthesia with Moderate (Conscious) Sedation Local Anesthetic: Lidocaine 1% Route: Intravenous (IV) IV Access: Secured Sedation: Meaningful verbal contact was maintained at all times during the procedure  Indication(s): Analgesia and Anxiety   Indications: 1. Chronic cervical radiculopathy (Bilateral) (L>R)   2. Cervical central spinal stenosis C5-6 (Right)    Pain Score: Pre-procedure: 5 /10 Post-procedure: 0-No pain/10  Pre-op Assessment:  Sarah Fisher is a 57 y.o. (year old), female patient, seen today for interventional treatment. She  has a past surgical history that includes Breast surgery (1986); Hand surgery (Bilateral, 2000 and 2016); cervical facet block (2010); Wisdom tooth extraction (1979); Mass excision (Left, 08/10/2017); I&D extremity (Left, 08/10/2017); and left finger surgery (Left, 08/2017). Sarah Fisher has a current medication list which includes the following prescription(s): glucos-chondroit-ca-mg-c-d, green tea, ibuprofen, losartan, multi-vitamin gummies, omega 3, simvastatin, tizanidine, cholecalciferol, gabapentin, multivitamin, aloe 10000 & probiotics, and tramadol, and the following Facility-Administered Medications:  fentanyl. Her primarily concern today is the Shoulder Pain (left) and Neck Pain (left is worse than the right)  Initial Vital Signs:  Pulse Rate: 64 Temp:   Resp: 16 BP: (!) 142/93 SpO2: 100 %  5 out of 5 strength bilateral upper extremity: Shoulder abduction, elbow flexion, elbow extension, thumb extension.   BMI: Estimated body mass index is 29.29 kg/m as calculated from the following:   Height as of this encounter: 5' (1.524 m).   Weight as of this encounter: 150 lb (68 kg).  Risk Assessment: Allergies: Reviewed. She has No Known Allergies.  Allergy Precautions: None required Coagulopathies: Reviewed. None identified.  Blood-thinner therapy: None at this time Active Infection(s): Reviewed. None identified. Sarah Fisher is afebrile  Site Confirmation: Sarah Fisher was asked to confirm the procedure and laterality before marking the site Procedure checklist: Completed Consent: Before the procedure and under the influence of no sedative(s), amnesic(s), or anxiolytics, the patient was informed of the treatment options, risks and possible complications. To fulfill our ethical and legal obligations, as recommended by the American Medical Association's Code of Ethics, I have informed the patient of my clinical impression; the nature and purpose of the treatment or procedure; the risks, benefits, and possible complications of the intervention; the alternatives, including doing nothing; the risk(s) and benefit(s) of the alternative treatment(s) or procedure(s); and the risk(s) and benefit(s) of doing nothing. The patient was provided information about the general risks and possible complications associated with the procedure. These may include, but are not limited to: failure to achieve desired goals, infection, bleeding, organ or nerve damage, allergic reactions, paralysis, and death. In addition, the patient was informed of those risks and complications associated to Spine-related procedures,  such as failure to decrease pain; infection (i.e.: Meningitis, epidural or intraspinal abscess); bleeding (i.e.: epidural hematoma, subarachnoid hemorrhage, or any other type of intraspinal or peri-dural bleeding); organ  or nerve damage (i.e.: Any type of peripheral nerve, nerve root, or spinal cord injury) with subsequent damage to sensory, motor, and/or autonomic systems, resulting in permanent pain, numbness, and/or weakness of one or several areas of the body; allergic reactions; (i.e.: anaphylactic reaction); and/or death. Furthermore, the patient was informed of those risks and complications associated with the medications. These include, but are not limited to: allergic reactions (i.e.: anaphylactic or anaphylactoid reaction(s)); adrenal axis suppression; blood sugar elevation that in diabetics may result in ketoacidosis or comma; water retention that in patients with history of congestive heart failure may result in shortness of breath, pulmonary edema, and decompensation with resultant heart failure; weight gain; swelling or edema; medication-induced neural toxicity; particulate matter embolism and blood vessel occlusion with resultant organ, and/or nervous system infarction; and/or aseptic necrosis of one or more joints. Finally, the patient was informed that Medicine is not an exact science; therefore, there is also the possibility of unforeseen or unpredictable risks and/or possible complications that may result in a catastrophic outcome. The patient indicated having understood very clearly. We have given the patient no guarantees and we have made no promises. Enough time was given to the patient to ask questions, all of which were answered to the patient's satisfaction. Sarah Fisher has indicated that she wanted to continue with the procedure. Attestation: I, the ordering provider, attest that I have discussed with the patient the benefits, risks, side-effects, alternatives, likelihood of achieving  goals, and potential problems during recovery for the procedure that I have provided informed consent. Date  Time: 11/02/2017 10:21 AM  Pre-Procedure Preparation:  Monitoring: As per clinic protocol. Respiration, ETCO2, SpO2, BP, heart rate and rhythm monitor placed and checked for adequate function Safety Precautions: Patient was assessed for positional comfort and pressure points before starting the procedure. Time-out: I initiated and conducted the "Time-out" before starting the procedure, as per protocol. The patient was asked to participate by confirming the accuracy of the "Time Out" information. Verification of the correct person, site, and procedure were performed and confirmed by me, the nursing staff, and the patient. "Time-out" conducted as per Joint Commission's Universal Protocol (UP.01.01.01). Time: 1110  Description of Procedure Process:   Position: Prone with head of the table was raised to facilitate breathing. Target Area: For Epidural Steroid injections the target is the interlaminar space, initially targeting the lower border of the superior vertebral body lamina. Approach: Paramedial approach. Area Prepped: Entire PosteriorCervical Region Prepping solution: ChloraPrep (2% chlorhexidine gluconate and 70% isopropyl alcohol) Safety Precautions: Aspiration looking for blood return was conducted prior to all injections. At no point did we inject any substances, as a needle was being advanced. No attempts were made at seeking any paresthesias. Safe injection practices and needle disposal techniques used. Medications properly checked for expiration dates. SDV (single dose vial) medications used. Description of the Procedure: Protocol guidelines were followed. The procedure needle was introduced through the skin, ipsilateral to the reported pain, and advanced to the target area. Bone was contacted and the needle walked caudad, until the lamina was cleared. The epidural space was  identified using "loss-of-resistance technique" with 2-3 ml of PF-NaCl (0.9% NSS), in a 5cc LOR glass syringe. Vitals:   11/02/17 1120 11/02/17 1125 11/02/17 1137 11/02/17 1146  BP: (!) 152/88 (!) 141/77 131/79 129/81  Pulse: (!) 52     Resp: 15 16 16 16   SpO2: 95% 96% 96% 96%  Weight:      Height:  Start Time: 1110 hrs. End Time:   hrs. Materials:  Needle(s) Type: Epidural needle Gauge: 17G Length: 3.5-in Medication(s): Please see orders for medications and dosing details. 4 cc solution made of 2.5 cc of preservative-free saline, 0.5 cc of 0.2% ropivacaine, 1 cc of Decadron 10 mg/cc. Imaging Guidance (Spinal):  Type of Imaging Technique: Fluoroscopy Guidance (Spinal) Indication(s): Assistance in needle guidance and placement for procedures requiring needle placement in or near specific anatomical locations not easily accessible without such assistance. Exposure Time: Please see nurses notes. Contrast: Before injecting any contrast, we confirmed that the patient did not have an allergy to iodine, shellfish, or radiological contrast. Once satisfactory needle placement was completed at the desired level, radiological contrast was injected. Contrast injected under live fluoroscopy. No contrast complications. See chart for type and volume of contrast used. Fluoroscopic Guidance: I was personally present during the use of fluoroscopy. "Tunnel Vision Technique" used to obtain the best possible view of the target area. Parallax error corrected before commencing the procedure. "Direction-depth-direction" technique used to introduce the needle under continuous pulsed fluoroscopy. Once target was reached, antero-posterior, oblique, and lateral fluoroscopic projection used confirm needle placement in all planes. Images permanently stored in EMR. Interpretation: I personally interpreted the imaging intraoperatively. Adequate needle placement confirmed in multiple planes. Appropriate spread of  contrast into desired area was observed. No evidence of afferent or efferent intravascular uptake. No intrathecal or subarachnoid spread observed. Permanent images saved into the patient's record.  Antibiotic Prophylaxis:   Anti-infectives (From admission, onward)   None     Indication(s): None identified  Post-operative Assessment:  Post-procedure Vital Signs:  Pulse Rate: (!) 52 Temp:   Resp: 16 BP: 129/81 SpO2: 96 %  EBL: None  Complications: No immediate post-treatment complications observed by team, or reported by patient.  Note: The patient tolerated the entire procedure well. A repeat set of vitals were taken after the procedure and the patient was kept under observation following institutional policy, for this type of procedure. Post-procedural neurological assessment was performed, showing return to baseline, prior to discharge. The patient was provided with post-procedure discharge instructions, including a section on how to identify potential problems. Should any problems arise concerning this procedure, the patient was given instructions to immediately contact us, at any time, without hesitation. In any case, we plan to contact the patient by telephone for a follow-up status report regarding this interventional procedure.  Comments:  No additional relevant information.  5 out of 5 strength bilateral upper extremity: Shoulder abduction, elbow flexion, elbow extension, thumb extension.  Plan of Care  Also recommended patient increase her gabapentin.  She is currently taking 500 mg nightly and is not noticing any significant benefit.  Recommend 300 mg daily, 600 mg nightly.  New prescription provided.  Imaging Orders     DG C-Arm 1-60 Min-No Report Procedure Orders    No procedure(s) ordered today    Medications ordered for procedure: Meds ordered this encounter  Medications  . lactated ringers infusion 1,000 mL  . fentaNYL (SUBLIMAZE) injection 25-100 mcg    Make  sure Narcan is available in the pyxis when using this medication. In the event of respiratory depression (RR< 8/min): Titrate NARCAN (naloxone) in increments of 0.1 to 0.2 mg IV at 2-3 minute intervals, until desired degree of reversal.  . iopamidol (ISOVUE-M) 41 % intrathecal injection 10 mL  . dexamethasone (DECADRON) injection 10 mg  . ropivacaine (PF) 2 mg/mL (0.2%) (NAROPIN) injection 1 mL  . sodium chloride flush (  NS) 0.9 % injection 1 mL  . lidocaine (PF) (XYLOCAINE) 1 % injection 4.5 mL  . gabapentin (NEURONTIN) 300 MG capsule    Sig: 300 mg qday, 600 mg qhs    Dispense:  90 capsule    Refill:  2   Medications administered: We administered lactated ringers, fentaNYL, iopamidol, dexamethasone, ropivacaine (PF) 2 mg/mL (0.2%), sodium chloride flush, and lidocaine (PF).  See the medical record for exact dosing, route, and time of administration.  New Prescriptions   GABAPENTIN (NEURONTIN) 300 MG CAPSULE    300 mg qday, 600 mg qhs   Disposition: Discharge home  Discharge Date & Time: 11/02/2017; 1148 hrs.   Physician-requested Follow-up: Return in about 3 weeks (around 11/23/2017) for Post Procedure Evaluation.  Future Appointments  Date Time Provider [place]  11/25/2017  2:00 PM Gillis Santa, MD [facility] None  02/24/2018  9:45 AM Vevelyn Francois, NP [facility] None   Primary Care Physician: Ma Hillock, DO Location: [facility] Note by: Gillis Santa, MD Date: 11/02/2017; Time: 2:07 PM  Disclaimer:  Medicine is not an exact science. The only guarantee in medicine is that nothing is guaranteed. It is important to note that the decision to proceed with this intervention was based on the information collected from the patient. The Data and conclusions were drawn from the patient's questionnaire, the interview, and the physical examination. Because the information was provided in large part by the patient, it cannot be guaranteed that it has  not been purposely or unconsciously manipulated. Every effort has been made to obtain as much relevant data as possible for this evaluation. It is important to note that the conclusions that lead to this procedure are derived in large part from the available data. Always take into account that the treatment will also be dependent on availability of resources and existing treatment guidelines, considered by other Pain Management Practitioners as being common knowledge and practice, at the time of the intervention. For Medico-Legal purposes, it is also important to point out that variation in procedural techniques and pharmacological choices are the acceptable norm. The indications, contraindications, technique, and results of the above procedure should only be interpreted and judged by a Board-Certified Interventional Pain Specialist with extensive familiarity and expertise in the same exact procedure and technique.

## 2017-11-02 NOTE — Progress Notes (Signed)
Safety precautions to be maintained throughout the outpatient stay will include: orient to surroundings, keep bed in low position, maintain call bell within reach at all times, provide assistance with transfer out of bed and ambulation.  

## 2017-11-02 NOTE — Patient Instructions (Signed)

## 2017-11-03 ENCOUNTER — Telehealth: Payer: Self-pay | Admitting: *Deleted

## 2017-11-03 NOTE — Telephone Encounter (Signed)
Spoke with patient re; procedure on yesterday, verbalizes no questions or concerns.  

## 2017-11-25 ENCOUNTER — Other Ambulatory Visit: Payer: Self-pay

## 2017-11-25 ENCOUNTER — Ambulatory Visit
Payer: BLUE CROSS/BLUE SHIELD | Attending: Student in an Organized Health Care Education/Training Program | Admitting: Student in an Organized Health Care Education/Training Program

## 2017-11-25 ENCOUNTER — Encounter: Payer: Self-pay | Admitting: Student in an Organized Health Care Education/Training Program

## 2017-11-25 VITALS — BP 143/111 | HR 92 | Temp 98.1°F | Ht 60.0 in | Wt 147.0 lb

## 2017-11-25 DIAGNOSIS — M542 Cervicalgia: Secondary | ICD-10-CM | POA: Diagnosis not present

## 2017-11-25 DIAGNOSIS — Z85828 Personal history of other malignant neoplasm of skin: Secondary | ICD-10-CM | POA: Diagnosis not present

## 2017-11-25 DIAGNOSIS — M170 Bilateral primary osteoarthritis of knee: Secondary | ICD-10-CM | POA: Insufficient documentation

## 2017-11-25 DIAGNOSIS — E785 Hyperlipidemia, unspecified: Secondary | ICD-10-CM | POA: Diagnosis not present

## 2017-11-25 DIAGNOSIS — E559 Vitamin D deficiency, unspecified: Secondary | ICD-10-CM | POA: Diagnosis not present

## 2017-11-25 DIAGNOSIS — M4722 Other spondylosis with radiculopathy, cervical region: Secondary | ICD-10-CM | POA: Diagnosis not present

## 2017-11-25 DIAGNOSIS — M419 Scoliosis, unspecified: Secondary | ICD-10-CM | POA: Insufficient documentation

## 2017-11-25 DIAGNOSIS — M069 Rheumatoid arthritis, unspecified: Secondary | ICD-10-CM | POA: Diagnosis not present

## 2017-11-25 DIAGNOSIS — R51 Headache: Secondary | ICD-10-CM | POA: Diagnosis not present

## 2017-11-25 DIAGNOSIS — K5909 Other constipation: Secondary | ICD-10-CM | POA: Insufficient documentation

## 2017-11-25 DIAGNOSIS — G5600 Carpal tunnel syndrome, unspecified upper limb: Secondary | ICD-10-CM | POA: Insufficient documentation

## 2017-11-25 DIAGNOSIS — Z79899 Other long term (current) drug therapy: Secondary | ICD-10-CM | POA: Insufficient documentation

## 2017-11-25 DIAGNOSIS — H8112 Benign paroxysmal vertigo, left ear: Secondary | ICD-10-CM | POA: Diagnosis not present

## 2017-11-25 DIAGNOSIS — M4802 Spinal stenosis, cervical region: Secondary | ICD-10-CM | POA: Diagnosis not present

## 2017-11-25 DIAGNOSIS — G8929 Other chronic pain: Secondary | ICD-10-CM | POA: Diagnosis not present

## 2017-11-25 DIAGNOSIS — E663 Overweight: Secondary | ICD-10-CM | POA: Diagnosis not present

## 2017-11-25 DIAGNOSIS — G894 Chronic pain syndrome: Secondary | ICD-10-CM

## 2017-11-25 DIAGNOSIS — M50122 Cervical disc disorder at C5-C6 level with radiculopathy: Secondary | ICD-10-CM | POA: Diagnosis not present

## 2017-11-25 DIAGNOSIS — Z6828 Body mass index (BMI) 28.0-28.9, adult: Secondary | ICD-10-CM | POA: Insufficient documentation

## 2017-11-25 DIAGNOSIS — Z79891 Long term (current) use of opiate analgesic: Secondary | ICD-10-CM | POA: Diagnosis not present

## 2017-11-25 DIAGNOSIS — I10 Essential (primary) hypertension: Secondary | ICD-10-CM | POA: Diagnosis not present

## 2017-11-25 DIAGNOSIS — M47812 Spondylosis without myelopathy or radiculopathy, cervical region: Secondary | ICD-10-CM

## 2017-11-25 DIAGNOSIS — M5412 Radiculopathy, cervical region: Secondary | ICD-10-CM | POA: Diagnosis not present

## 2017-11-25 DIAGNOSIS — R202 Paresthesia of skin: Secondary | ICD-10-CM | POA: Insufficient documentation

## 2017-11-25 MED ORDER — DICLOFENAC SODIUM 75 MG PO TBEC: 75.0000 mg | DELAYED_RELEASE_TABLET | Freq: Two times a day (BID) | ORAL | 1 refills | Status: DC

## 2017-11-25 NOTE — Progress Notes (Signed)
Nursing Pain Medication Assessment:  Safety precautions to be maintained throughout the outpatient stay will include: orient to surroundings, keep bed in low position, maintain call bell within reach at all times, provide assistance with transfer out of bed and ambulation.  Medication Inspection Compliance: Sarah Fisher did not comply with our request to bring her pills to be counted. She was reminded that bringing the medication bottles, even when empty, is a requirement.  Medication: None brought in. Pill/Patch Count: None available to be counted. Bottle Appearance: No container available. Did not bring bottle(s) to appointment. Filled Date: N/A Last Medication intake:  forgot

## 2017-11-25 NOTE — Progress Notes (Signed)
Patient's Name: Sarah Fisher  MRN: [id number]  Referring Provider: Ma Hillock, DO  DOB: October 17, 1960  PCP: Ma Hillock, DO  DOS: 11/25/2017  Note by: Gillis Santa, MD  Service setting: Ambulatory outpatient  Specialty: Interventional Pain Management  Location: [facility] (AMB) Pain Management Facility    Patient type: Established   Primary Reason(s) for Visit: Encounter for post-procedure evaluation of chronic illness with mild to moderate exacerbation CC: Neck Pain (left)  HPI  Sarah Fisher is a 57 y.o. year old, female patient, who comes today for a post-procedure evaluation. She has Hypertension; Hyperlipidemia; Overweight; Vitamin D deficiency; Benign paroxysmal positional vertigo of left ear; Adenomatous polyp of colon; Chronic pain syndrome; Osteoarthritis of cervical spine; Cervical DDD (degenerative disc disease) (C5-6); Cervical central spinal stenosis C5-6 (Right); Chronic neck pain (Location of Primary Source of Pain) (Bilateral) (L>R); Cervicogenic headache (Bilateral) (R>L); Occipital headache (Bilateral) (R>L); Neurogenic pain; Myofascial pain; Chronic shoulder pain (Location of Tertiary source of pain) (Bilateral) (L>R); Chronic upper back pain (Location of Secondary source of pain) (Bilateral) (L>R); Chronic low back pain (Bilateral) (L>R); Cervical spondylosis; Numbness of upper extremity (Bilateral) (L>R); Chronic cervical radiculopathy (Bilateral) (L>R); Chronic knee pain (Bilateral) (R>L); Osteoarthritis of knee (Bilateral) (R>L); Rheumatoid arthritis, seronegative, shoulder region (Bilateral) (L>R); Chronic idiopathic constipation; and Long term (current) use of opiate analgesic on their problem list. Her primarily concern today is the Neck Pain (left)  Pain Assessment: Location: Left, Right Neck Radiating: Pain radiated down right shoulder to hand , hands become numb Onset: More than a month ago Duration: Chronic pain Quality: Constant, Numbness, Tightness, Pressure,  Discomfort Severity: 6 /10 (subjective, self-reported pain score)  Note: Reported level is inconsistent with clinical observations.                         When using our objective Pain Scale, levels between 6 and 10/10 are said to belong in an emergency room, as it progressively worsens from a 6/10, described as severely limiting, requiring emergency care not usually available at an outpatient pain management facility. At a 6/10 level, communication becomes difficult and requires great effort. Assistance to reach the emergency department may be required. Facial flushing and profuse sweating along with potentially dangerous increases in heart rate and blood pressure will be evident. Effect on ADL:   Timing: Constant Modifying factors: heat pad, advil, meds BP: (!) 143/111  HR: 92  Sarah Fisher comes in today for post-procedure evaluation after the treatment done on 11/03/2017.  Further details on both, my assessment(s), as well as the proposed treatment plan, please see below.  Post-Procedure Assessment  11/02/2017 Procedure: left C7-T1 ESI #2 Pre-procedure pain score:  5/10 Post-procedure pain score: 0/10         Influential Factors: BMI: 28.71 kg/m Intra-procedural challenges: None observed.         Assessment challenges: None detected.              Reported side-effects: None.        Post-procedural adverse reactions or complications: None reported         Sedation: Please see nurses note. When no sedatives are used, the analgesic levels obtained are directly associated to the effectiveness of the local anesthetics. However, when sedation is provided, the level of analgesia obtained during the initial 1 hour following the intervention, is believed to be the result of a combination of factors. These factors may include, but are not limited to: 1. The effectiveness  of the local anesthetics used. 2. The effects of the analgesic(s) and/or anxiolytic(s) used. 3. The degree of discomfort  experienced by the patient at the time of the procedure. 4. The patients ability and reliability in recalling and recording the events. 5. The presence and influence of possible secondary gains and/or psychosocial factors. Reported result: Relief experienced during the 1st hour after the procedure: 100 % (Ultra-Short Term Relief)            Interpretative annotation: Clinically appropriate result. Analgesia during this period is likely to be Local Anesthetic and/or IV Sedative (Analgesic/Anxiolytic) related.          Effects of local anesthetic: The analgesic effects attained during this period are directly associated to the localized infiltration of local anesthetics and therefore cary significant diagnostic value as to the etiological location, or anatomical origin, of the pain. Expected duration of relief is directly dependent on the pharmacodynamics of the local anesthetic used. Long-acting (4-6 hours) anesthetics used.  Reported result: Relief during the next 4 to 6 hour after the procedure: 30 % (Short-Term Relief)            Interpretative annotation: Clinically appropriate result. Analgesia during this period is likely to be Local Anesthetic-related.          Long-term benefit: Defined as the period of time past the expected duration of local anesthetics (1 hour for short-acting and 4-6 hours for long-acting). With the possible exception of prolonged sympathetic blockade from the local anesthetics, benefits during this period are typically attributed to, or associated with, other factors such as analgesic sensory neuropraxia, antiinflammatory effects, or beneficial biochemical changes provided by agents other than the local anesthetics.  Reported result: Extended relief following procedure: 25 % (Long-Term Relief)            Interpretative annotation: Clinically appropriate result. Good relief. No permanent benefit expected. Inflammation plays a part in the etiology to the pain.           Current benefits: Defined as reported results that persistent at this point in time.   Analgesia: 25 %            Function: Somewhat improved ROM: Somewhat improved Interpretative annotation: Recurrence of symptoms. No permanent benefit expected. Effective diagnostic intervention.          Interpretation: Results would suggest area to be involved, however, further evaluation and testing may be required.                  Plan:  Please see "Plan of Care" for details.                Laboratory Chemistry  Inflammation Markers (CRP: Acute Phase) (ESR: Chronic Phase) Lab Results  Component Value Date   CRP <0.8 10/01/2016   ESRSEDRATE 21 10/01/2016                         Rheumatology Markers Lab Results  Component Value Date   RF <10.0 10/01/2016   ANA Negative 10/01/2016                        Renal Function Markers Lab Results  Component Value Date   BUN 18 06/10/2017   CREATININE 0.67 06/10/2017   GFRAA >60 10/01/2016   GFRNONAA >60 10/01/2016  Hepatic Function Markers Lab Results  Component Value Date   AST 17 06/10/2017   ALT 16 06/10/2017   ALBUMIN 4.7 06/10/2017   ALKPHOS 63 06/10/2017   HCVAB NEGATIVE 05/15/2016                        Electrolytes Lab Results  Component Value Date   NA 142 06/10/2017   K 4.2 06/10/2017   CL 103 06/10/2017   CALCIUM 9.6 06/10/2017   MG 2.2 10/01/2016                        Neuropathy Markers Lab Results  Component Value Date   RWERXVQM08 676 10/01/2016   HGBA1C 5.4 06/10/2017   HIV NONREACTIVE 05/15/2016                        Bone Pathology Markers Lab Results  Component Value Date   VD25OH 27.56 (L) 06/10/2017   25OHVITD1 40 10/01/2016   25OHVITD2 18 10/01/2016   25OHVITD3 22 10/01/2016                         Coagulation Parameters Lab Results  Component Value Date   PLT 322.0 06/10/2017                        Cardiovascular Markers Lab Results  Component Value Date    HGB 12.7 06/10/2017   HCT 38.8 06/10/2017                         CA Markers No results found for: CEA, CA125, LABCA2                      Note: Lab results reviewed.  Recent Diagnostic Imaging Results  DG C-Arm 1-60 Min-No Report Fluoroscopy was utilized by the requesting physician.  No radiographic  interpretation.   Complexity Note: Imaging results reviewed. Results shared with Sarah Fisher, using Layman's terms.                         Meds   Current Outpatient Medications:  .  cholecalciferol (VITAMIN D) 1000 units tablet, Take 1,000 Units by mouth daily., Disp: , Rfl:  .  gabapentin (NEURONTIN) 300 MG capsule, 300 mg qday, 600 mg qhs, Disp: 90 capsule, Rfl: 2 .  GLUCOS-CHONDROIT-CA-MG-C-D PO, Take by mouth daily., Disp: , Rfl:  .  Green Tea 315 MG CAPS, Take by mouth daily., Disp: , Rfl:  .  ibuprofen (ADVIL,MOTRIN) 200 MG tablet, Take 400 mg by mouth every 4 (four) hours as needed. , Disp: , Rfl:  .  losartan (COZAAR) 50 MG tablet, Take 1 tablet (50 mg total) by mouth daily., Disp: 90 tablet, Rfl: 1 .  Multiple Vitamin (MULTIVITAMIN) LIQD, Take 5 mLs by mouth daily., Disp: , Rfl:  .  Multiple Vitamins-Minerals (MULTI-VITAMIN GUMMIES) CHEW, Chew 1 Units by mouth daily., Disp: , Rfl:  .  Omega 3 1000 MG CAPS, Take 1 capsule by mouth daily., Disp: , Rfl:  .  simvastatin (ZOCOR) 10 MG tablet, Take 1 tablet (10 mg total) by mouth daily., Disp: 90 tablet, Rfl: 1 .  tizanidine (ZANAFLEX) 2 MG capsule, Take 1 capsule (2 mg total) by mouth 3 (three) times daily as needed for muscle spasms., Disp: 90 capsule, Rfl:  5 .  diclofenac (VOLTAREN) 75 MG EC tablet, Take 1 tablet (75 mg total) by mouth 2 (two) times daily after a meal., Disp: 60 tablet, Rfl: 1 .  Probiotic Product (ALOE 84536 & PROBIOTICS) CAPS, Take by mouth., Disp: , Rfl:  .  traMADol (ULTRAM) 50 MG tablet, Take 1 tablet (50 mg total) by mouth every 6 (six) hours as needed for severe pain., Disp: 120 tablet, Rfl: 2  ROS   Constitutional: Denies any fever or chills Gastrointestinal: No reported hemesis, hematochezia, vomiting, or acute GI distress Musculoskeletal: Denies any acute onset joint swelling, redness, loss of ROM, or weakness Neurological: No reported episodes of acute onset apraxia, aphasia, dysarthria, agnosia, amnesia, paralysis, loss of coordination, or loss of consciousness  Allergies  Sarah Fisher has No Known Allergies.  [place]  Drug: Sarah Fisher  reports that she does not use drugs. Alcohol:  reports that she does not drink alcohol. Tobacco:  reports that she has never smoked. She has never used smokeless tobacco. Medical:  has a past medical history of Anemia, Arthritis, Cervical spine degeneration (2010), History of carpal tunnel syndrome, Hyperlipidemia, Hypertension, Sacroiliac joint pain (2010), Scoliosis, and Skin cancer of eyelid. Surgical: Sarah Fisher  has a past surgical history that includes Breast surgery (1986); Hand surgery (Bilateral, 2000 and 2016); cervical facet block (2010); Wisdom tooth extraction (1979); Mass excision (Left, 08/10/2017); I&D extremity (Left, 08/10/2017); and left finger surgery (Left, 08/2017). Family: family history includes Alzheimer's disease in her maternal grandfather and paternal grandmother; Arthritis in her maternal aunt and mother; Colon cancer (age of onset: 43) in her maternal grandfather; Diabetes in her maternal grandfather; Hypertension in her father, maternal grandmother, mother, and sister; Kidney cancer in her maternal grandmother; Liver cancer in her father.  Constitutional Exam  General appearance: Well nourished, well developed, and well hydrated. In no apparent acute distress Vitals:   11/25/17 1151  BP: (!) 143/111  Pulse: 92  Temp: 98.1 F (36.7 C)  SpO2: 98%  Weight: 147 lb (66.7 kg)  Height: 5' (1.524 m)   BMI Assessment: Estimated body mass index is 28.71 kg/m as calculated from the following:   Height as of this encounter: 5'  (1.524 m).   Weight as of this encounter: 147 lb (66.7 kg).  BMI interpretation table: BMI level Category Range association with higher incidence of chronic pain  <18 kg/m2 Underweight   18.5-24.9 kg/m2 Ideal body weight   25-29.9 kg/m2 Overweight Increased incidence by 20%  30-34.9 kg/m2 Obese (Class I) Increased incidence by 68%  35-39.9 kg/m2 Severe obesity (Class II) Increased incidence by 136%  >40 kg/m2 Extreme obesity (Class III) Increased incidence by 254%   Patient's current BMI Ideal Body weight  Body mass index is 28.71 kg/m. Ideal body weight: 45.5 kg (100 lb 4.9 oz) Adjusted ideal body weight: 54 kg (118 lb 15.8 oz)   BMI Readings from Last 4 Encounters:  11/25/17 28.71 kg/m  11/02/17 29.29 kg/m  09/28/17 28.51 kg/m  09/09/17 28.51 kg/m   Wt Readings from Last 4 Encounters:  11/25/17 147 lb (66.7 kg)  11/02/17 150 lb (68 kg)  09/28/17 146 lb (66.2 kg)  09/09/17 146 lb (66.2 kg)  Psych/Mental status: Alert, oriented x 3 (person, place, & time)       Eyes: PERLA Respiratory: No evidence of acute respiratory distress  Cervical Spine Area Exam  Skin & Axial Inspection: No masses, redness, edema, swelling, or associated skin lesions Alignment: Symmetrical Functional ROM: Decreased ROM, to the left  Stability: No instability detected Muscle Tone/Strength: Functionally intact. No obvious neuro-muscular anomalies detected. Sensory (Neurological): Arthropathic arthralgia Palpation: Complains of area being tender to palpation Positive provocative maneuver for for cervical facet disease  Upper Extremity (UE) Exam    Side: Right upper extremity  Side: Left upper extremity  Skin & Extremity Inspection: Skin color, temperature, and hair growth are WNL. No peripheral edema or cyanosis. No masses, redness, swelling, asymmetry, or associated skin lesions. No contractures.  Skin & Extremity Inspection: Skin color, temperature, and hair growth are WNL. No peripheral edema or  cyanosis. No masses, redness, swelling, asymmetry, or associated skin lesions. No contractures.  Functional ROM: Unrestricted ROM          Functional ROM: Unrestricted ROM          Muscle Tone/Strength: Functionally intact. No obvious neuro-muscular anomalies detected.  Muscle Tone/Strength: Functionally intact. No obvious neuro-muscular anomalies detected.  Sensory (Neurological): Unimpaired          Sensory (Neurological): Dermatomal pain pattern affecting all joints of upper extremity  Palpation: No palpable anomalies              Palpation: No palpable anomalies              Provocative Test(s):  Phalen's test: deferred Tinel's test: deferred Apley's scratch test (touch opposite shoulder):  Action 1 (Across chest): deferred Action 2 (Overhead): deferred Action 3 (LB reach): deferred   Provocative Test(s):  Phalen's test: deferred Tinel's test: deferred Apley's scratch test (touch opposite shoulder):  Action 1 (Across chest): Decreased ROM Action 2 (Overhead): Decreased ROM Action 3 (LB reach): Decreased ROM    Thoracic Spine Area Exam  Skin & Axial Inspection: No masses, redness, or swelling Alignment: Symmetrical Functional ROM: Unrestricted ROM Stability: No instability detected Muscle Tone/Strength: Functionally intact. No obvious neuro-muscular anomalies detected. Sensory (Neurological): Unimpaired Muscle strength & Tone: No palpable anomalies  Lumbar Spine Area Exam  Skin & Axial Inspection: No masses, redness, or swelling Alignment: Symmetrical Functional ROM: Unrestricted ROM       Stability: No instability detected Muscle Tone/Strength: Functionally intact. No obvious neuro-muscular anomalies detected. Sensory (Neurological): Unimpaired Palpation: No palpable anomalies       Provocative Tests: Lumbar Hyperextension/rotation test: deferred today       Lumbar quadrant test (Kemp's test): deferred today       Lumbar Lateral bending test: deferred today        Patrick's Maneuver: deferred today                   FABER test: deferred today       Thigh-thrust test: deferred today       S-I compression test: deferred today       S-I distraction test: deferred today        Gait & Posture Assessment  Ambulation: Unassisted Gait: Relatively normal for age and body habitus Posture: WNL   Lower Extremity Exam    Side: Right lower extremity  Side: Left lower extremity  Stability: No instability observed          Stability: No instability observed          Skin & Extremity Inspection: Skin color, temperature, and hair growth are WNL. No peripheral edema or cyanosis. No masses, redness, swelling, asymmetry, or associated skin lesions. No contractures.  Skin & Extremity Inspection: Skin color, temperature, and hair growth are WNL. No peripheral edema or cyanosis. No masses, redness, swelling, asymmetry, or associated skin lesions. No contractures.  Functional ROM: Unrestricted ROM                  Functional ROM: Unrestricted ROM                  Muscle Tone/Strength: Functionally intact. No obvious neuro-muscular anomalies detected.  Muscle Tone/Strength: Functionally intact. No obvious neuro-muscular anomalies detected.  Sensory (Neurological): Arthropathic arthralgia  Sensory (Neurological): Arthropathic arthralgia  Palpation: No palpable anomalies  Palpation: No palpable anomalies  Right greater than left knee pain  Assessment  Primary Diagnosis & Pertinent Problem List: The primary encounter diagnosis was Spondylosis of cervical region without myelopathy or radiculopathy. Diagnoses of Cervical facet syndrome, Chronic neck pain (Location of Primary Source of Pain) (Bilateral) (L>R), Chronic cervical radiculopathy (Bilateral) (L>R), Cervical central spinal stenosis C5-6 (Right), and Chronic pain syndrome were also pertinent to this visit.  Status Diagnosis  Worsening Persistent Persistent 1. Spondylosis of cervical region without myelopathy or  radiculopathy   2. Cervical facet syndrome   3. Chronic neck pain (Location of Primary Source of Pain) (Bilateral) (L>R)   4. Chronic cervical radiculopathy (Bilateral) (L>R)   5. Cervical central spinal stenosis C5-6 (Right)   6. Chronic pain syndrome     General Recommendations: The pain condition that the patient suffers from is best treated with a multidisciplinary approach that involves an increase in physical activity to prevent de-conditioning and worsening of the pain cycle, as well as psychological counseling (formal and/or informal) to address the co-morbid psychological affects of pain. Treatment will often involve judicious use of pain medications and interventional procedures to decrease the pain, allowing the patient to participate in the physical activity that will ultimately produce long-lasting pain reductions. The goal of the multidisciplinary approach is to return the patient to a higher level of overall function and to restore their ability to perform activities of daily living.  57 year old female with a history of chronic cervical radiculopathy, left greater than right who presents status post left C7-T1 epidural steroid injection #2 on 11/02/2017 (#1 done on 09/09/2017)  who notes moderate benefit after her second epidural steroid injection.  Patient feels that her first epidural steroid injection resulted in greater pain relief than her second one.  She does state that her radicular pain on her left side has improved.  Her primary complaint today is left shoulder, neck, periscapular pain.  We reviewed the patient's lumbar MRI from December 2018 which shows facet arthropathy as well as cervical spondylosis on the left at C4, 5, 6, 7.  We discussed cervical facet medial branch nerve blocks on the left.     Sarah Fisher has a history of greater than 3 months of moderate to severe pain which is resulted in functional impairment.  The patient has tried various conservative therapeutic  options such as NSAIDs, Tylenol, muscle relaxants, physical therapy which was inadequately effective.  Patient's pain is predominantly axial with physical exam findings suggestive of facet arthropathy. Cervical facet medial branch nerve blocks were discussed with the patient.  Risks and benefits were reviewed.  Patient would like to proceed with LEFT C4, C5, C6, C7 medial branch nerve block with sedation.  Patient is also endorsing right greater than left knee pain.  She states that it is worse when she is walking downstairs or stands from a sitting position.  Patient's previous knee x-rays were unremarkable for misalignment, fractures, osteophytes.  This could be secondary to myofascial pain or ligamentous injury.  Recommend conservative therapy with a trial of  oral diclofenac to help out with her knee symptoms.  If this is not beneficial, will obtain more detailed imaging in the form of knee MRI to evaluate for meniscal injury versus ligamentous injury.  Plan: -Left C4, 5, 6, 7 cervical facet medial branch nerve block with sedation for cervical spondylosis, cervical facet arthropathy -Diclofenac 75 mg twice daily to be taken after a meal for bilateral, right greater than left, knee pain   Plan of Care  Pharmacotherapy (Medications Ordered): Meds ordered this encounter  Medications  . diclofenac (VOLTAREN) 75 MG EC tablet    Sig: Take 1 tablet (75 mg total) by mouth 2 (two) times daily after a meal.    Dispense:  60 tablet    Refill:  1   Lab-work, procedure(s), and/or referral(s): Orders Placed This Encounter  Procedures  . CERVICAL FACET (MEDIAL BRANCH NERVE BLOCK)    Time Note: Greater than 50% of the 25 minute(s) of face-to-face time spent with Sarah Fisher, was spent in counseling/coordination of care regarding: Sarah Fisher primary cause of pain, the treatment plan, treatment alternatives, the risks and possible complications of proposed treatment, medication side effects, going over  the informed consent, the results, interpretation and significance of  her recent diagnostic interventional treatment(s), the appropriate use of her medications and realistic expectations.  Provider-requested follow-up: Return in about 3 weeks (around 12/16/2017) for Procedure.  Future Appointments  Date Time Provider [place]  12/16/2017  9:30 AM Gillis Santa, MD [facility] None  02/24/2018  9:45 AM Vevelyn Francois, NP [facility] None    Primary Care Physician: Ma Hillock, DO Location: [facility] Note by: Gillis Santa, M.D Date: 11/25/2017; Time: 12:53 PM  There are no Patient Instructions on file for this visit.

## 2017-11-26 ENCOUNTER — Encounter: Payer: Self-pay | Admitting: Family Medicine

## 2017-11-26 ENCOUNTER — Ambulatory Visit: Payer: BLUE CROSS/BLUE SHIELD | Admitting: Family Medicine

## 2017-11-26 VITALS — BP 116/80 | HR 87 | Temp 98.2°F | Resp 20 | Ht 60.0 in | Wt 150.0 lb

## 2017-11-26 DIAGNOSIS — J029 Acute pharyngitis, unspecified: Secondary | ICD-10-CM

## 2017-11-26 LAB — POCT RAPID STREP A (OFFICE): Rapid Strep A Screen: NEGATIVE

## 2017-11-26 MED ORDER — AMOXICILLIN-POT CLAVULANATE 875-125 MG PO TABS: 1.0000 | ORAL_TABLET | Freq: Two times a day (BID) | ORAL | 0 refills | Status: DC

## 2017-11-26 MED ORDER — METHYLPREDNISOLONE ACETATE 80 MG/ML IJ SUSP
80.0000 mg | Freq: Once | INTRAMUSCULAR | Status: AC
  Administered 2017-11-26: 80 mg via INTRAMUSCULAR

## 2017-11-26 MED ORDER — PRAVASTATIN SODIUM 10 MG PO TABS: 10.0000 mg | ORAL_TABLET | Freq: Every day | ORAL | 3 refills | Status: DC

## 2017-11-26 NOTE — Progress Notes (Signed)
Sarah Fisher , [birth year], 57 y.o., female MRN: [id number] Patient Care Team    Relationship Specialty Notifications Start End  Ma Hillock, DO PCP - General Family Medicine  04/14/16   Daryll Brod, MD Consulting Physician Orthopedic Surgery  06/10/17   Mauri Pole, MD Consulting Physician Gastroenterology  06/10/17   Milinda Pointer, MD Referring Physician Pain Medicine  06/10/17     Chief Complaint  Patient presents with  . Sore Throat    swollen glands,ear pain x 5days     Subjective: Pt presents for an OV with complaints of sore throat, swollen glands, itching ears, fever and aching muscles for 6 days. She has tried tylenol, zicam and  salt water gargle.    Depression screen [facility] 2/9 11/25/2017 09/28/2017 09/09/2017 09/01/2017 06/10/2017  Decreased Interest 0 0 0 0 0  Down, Depressed, Hopeless 0 0 0 0 0  PHQ - 2 Score 0 0 0 0 0    No Known Allergies Social History   Tobacco Use  . Smoking status: Never Smoker  . Smokeless tobacco: Never Used  Substance Use Topics  . Alcohol use: No    Comment: a glass of wine occasionally   Past Medical History:  Diagnosis Date  . Anemia   . Arthritis   . Cervical spine degeneration 2010   C5-6  . History of carpal tunnel syndrome   . Hyperlipidemia   . Hypertension   . Sacroiliac joint pain 2010   right  . Scoliosis   . Skin cancer of eyelid    Past Surgical History:  Procedure Laterality Date  . BREAST SURGERY  1986   reduction  . cervical facet block  2010  . HAND SURGERY Bilateral 2000 and 2016   benign tumor removal   . I&D EXTREMITY Left 08/10/2017   Procedure: DEBRIDEMENT DISTAL INTERPHALANGEAL JOINT;  Surgeon: Leanora Cover, MD;  Location: [place];  Service: Orthopedics;  Laterality: Left;  . left finger surgery Left 08/2017  . MASS EXCISION Left 08/10/2017   Procedure: LEFT LONG FINGER EXCISION MASS;  Surgeon: Leanora Cover, MD;  Location: [place];  Service: Orthopedics;   Laterality: Left;  . WISDOM TOOTH EXTRACTION  1979   Family History  Problem Relation Age of Onset  . Hypertension Mother   . Arthritis Mother   . Hypertension Father   . Liver cancer Father   . Hypertension Sister   . Arthritis Maternal Aunt   . Hypertension Maternal Grandmother   . Kidney cancer Maternal Grandmother   . Alzheimer's disease Maternal Grandfather   . Colon cancer Maternal Grandfather 64  . Diabetes Maternal Grandfather   . Alzheimer's disease Paternal Grandmother   . Esophageal cancer Neg Hx   . Rectal cancer Neg Hx   . Stomach cancer Neg Hx    Allergies as of 11/26/2017   No Known Allergies     Medication List        Accurate as of 11/26/17 11:14 AM. Always use your most recent med list.          ALOE 01601 & PROBIOTICS Caps Take by mouth.   cholecalciferol 1000 units tablet Commonly known as:  VITAMIN D Take 1,000 Units by mouth daily.   diclofenac 75 MG EC tablet Commonly known as:  VOLTAREN Take 1 tablet (75 mg total) by mouth 2 (two) times daily after a meal.   gabapentin 300 MG capsule Commonly known as:  NEURONTIN 300 mg qday, 600  mg qhs   GLUCOS-CHONDROIT-CA-MG-C-D PO Take by mouth daily.   Green Tea 315 MG Caps Take by mouth daily.   ibuprofen 200 MG tablet Commonly known as:  ADVIL,MOTRIN Take 400 mg by mouth every 4 (four) hours as needed.   losartan 50 MG tablet Commonly known as:  COZAAR Take 1 tablet (50 mg total) by mouth daily.   MULTI-VITAMIN GUMMIES Chew Chew 1 Units by mouth daily.   multivitamin Liqd Take 5 mLs by mouth daily.   Omega 3 1000 MG Caps Take 1 capsule by mouth daily.   simvastatin 10 MG tablet Commonly known as:  ZOCOR Take 1 tablet (10 mg total) by mouth daily.   tizanidine 2 MG capsule Commonly known as:  ZANAFLEX Take 1 capsule (2 mg total) by mouth 3 (three) times daily as needed for muscle spasms.   traMADol 50 MG tablet Commonly known as:  ULTRAM Take 1 tablet (50 mg total) by mouth  every 6 (six) hours as needed for severe pain.       All past medical history, surgical history, allergies, family history, immunizations andmedications were updated in the EMR today and reviewed under the history and medication portions of their EMR.     ROS: Negative, with the exception of above mentioned in HPI   Objective:  BP 116/80 (BP Location: Right Arm, Patient Position: Sitting, Cuff Size: Normal)   Pulse 87   Temp 98.2 F (36.8 C)   Resp 20   Ht 5' (1.524 m)   Wt 150 lb (68 kg)   LMP  (LMP Unknown) Comment: LMP x 4 years  SpO2 97%   BMI 29.29 kg/m  Body mass index is 29.29 kg/m. Gen: Afebrile. No acute distress. Nontoxic in appearance, well developed, well nourished.  HENT: AT. [place]. Bilateral TM visualized without erythema or buldging. MMM, no oral lesions. Bilateral nares without erythema or drainage. Throat with modaerate erythema, no exudates. No cough. Mild hoarseness Eyes:Pupils Equal Round Reactive to light, Extraocular movements intact,  Conjunctiva without redness, discharge or icterus. Neck/lymp/endocrine: Supple,moderate swollen anterior cervical  Lymphadenopathy with tenderness CV: RRR  Chest: CTAB, no wheeze or crackles.  Abd: Soft. NTND. BS present.  Skin: no rashes, purpura or petechiae.  Neuro:  Normal gait. PERLA. EOMi. Alert. Oriented x3    No exam data present No results found. Results for orders placed or performed in visit on 11/26/17 (from the past 24 hour(s))  POCT rapid strep A     Status: Abnormal   Collection Time: 11/26/17 10:58 AM  Result Value Ref Range   Rapid Strep A Screen Negative Negative    Assessment/Plan: Sarah Fisher is a 57 y.o. female present for OV for  Sore throat/Pharyngitis - POCT rapid strep A--> negative - Upper Respiratory Culture - methylPREDNISolone acetate (DEPO-MEDROL) injection 80 mg Rest, hydrate.   mucinex  augemntin prescribed pt is to wait to start until either called with positive throat culture,  or she is getting worse over the weekend.  If cough present it can last up to 6-8 weeks.  F/U 2 weeks of not improved.    Reviewed expectations re: course of current medical issues.  Discussed self-management of symptoms.  Outlined signs and symptoms indicating need for more acute intervention.  Patient verbalized understanding and all questions were answered.  Patient received an After-Visit Summary.    Orders Placed This Encounter  Procedures  . POCT rapid strep A     Note is dictated utilizing voice recognition software.  Although note has been proof read prior to signing, occasional typographical errors still can be missed. If any questions arise, please do not hesitate to call for verification.   electronically signed by:  Howard Pouch, DO  [place]

## 2017-11-26 NOTE — Patient Instructions (Signed)
Rest, hydrate.  Start  mucinex  Augmentin  prescribed, do not start unless worsening symptoms or we call with positive culture result If cough present it can last up to 6-8 weeks.  F/U 2 weeks of not improved.     Pharyngitis Pharyngitis is a sore throat (pharynx). There is redness, pain, and swelling of your throat. Follow these instructions at home:  Drink enough fluids to keep your pee (urine) clear or pale yellow.  Only take medicine as told by your doctor. ? You may get sick again if you do not take medicine as told. Finish your medicines, even if you start to feel better. ? Do not take aspirin.  Rest.  Rinse your mouth (gargle) with salt water ( tsp of salt per 1 qt of water) every 1-2 hours. This will help the pain.  If you are not at risk for choking, you can suck on hard candy or sore throat lozenges. Contact a doctor if:  You have large, tender lumps on your neck.  You have a rash.  You cough up green, yellow-brown, or bloody spit. Get help right away if:  You have a stiff neck.  You drool or cannot swallow liquids.  You throw up (vomit) or are not able to keep medicine or liquids down.  You have very bad pain that does not go away with medicine.  You have problems breathing (not from a stuffy nose). This information is not intended to replace advice given to you by your health care provider. Make sure you discuss any questions you have with your health care provider. Document Released: 12/10/2007 Document Revised: 11/29/2015 Document Reviewed: 02/28/2013 Elsevier Interactive Patient Education  2017 [employer].

## 2017-11-28 LAB — CULTURE, UPPER RESPIRATORY
MICRO NUMBER:: 90630214
SPECIMEN QUALITY: ADEQUATE

## 2017-12-01 ENCOUNTER — Encounter: Payer: Self-pay | Admitting: *Deleted

## 2017-12-01 ENCOUNTER — Other Ambulatory Visit: Payer: Self-pay | Admitting: *Deleted

## 2017-12-01 MED ORDER — LOSARTAN POTASSIUM 50 MG PO TABS: 50.0000 mg | ORAL_TABLET | Freq: Every day | ORAL | 0 refills | Status: DC

## 2017-12-16 ENCOUNTER — Ambulatory Visit
Admission: RE | Admit: 2017-12-16 | Discharge: 2017-12-16 | Disposition: A | Discharge status: Home / Self Care | Payer: BLUE CROSS/BLUE SHIELD | Source: Ambulatory Visit | Attending: Student in an Organized Health Care Education/Training Program | Admitting: Student in an Organized Health Care Education/Training Program

## 2017-12-16 ENCOUNTER — Other Ambulatory Visit: Payer: Self-pay

## 2017-12-16 ENCOUNTER — Ambulatory Visit (HOSPITAL_BASED_OUTPATIENT_CLINIC_OR_DEPARTMENT_OTHER): Payer: BLUE CROSS/BLUE SHIELD | Admitting: Student in an Organized Health Care Education/Training Program

## 2017-12-16 ENCOUNTER — Encounter: Payer: Self-pay | Admitting: Student in an Organized Health Care Education/Training Program

## 2017-12-16 VITALS — BP 180/98 | HR 69 | Temp 98.0°F | Resp 14 | Ht 60.0 in | Wt 149.0 lb

## 2017-12-16 DIAGNOSIS — Z79899 Other long term (current) drug therapy: Secondary | ICD-10-CM | POA: Diagnosis not present

## 2017-12-16 DIAGNOSIS — Z79891 Long term (current) use of opiate analgesic: Secondary | ICD-10-CM | POA: Diagnosis not present

## 2017-12-16 DIAGNOSIS — Z9889 Other specified postprocedural states: Secondary | ICD-10-CM | POA: Diagnosis not present

## 2017-12-16 DIAGNOSIS — M47812 Spondylosis without myelopathy or radiculopathy, cervical region: Secondary | ICD-10-CM

## 2017-12-16 DIAGNOSIS — Z791 Long term (current) use of non-steroidal anti-inflammatories (NSAID): Secondary | ICD-10-CM | POA: Insufficient documentation

## 2017-12-16 MED ORDER — DIPHENHYDRAMINE HCL 50 MG/ML IJ SOLN
INTRAMUSCULAR | Status: AC
  Filled 2017-12-16: qty 1

## 2017-12-16 MED ORDER — DEXAMETHASONE SODIUM PHOSPHATE 10 MG/ML IJ SOLN
10.0000 mg | Freq: Once | INTRAMUSCULAR | Status: AC
  Administered 2017-12-16: 10 mg
  Filled 2017-12-16: qty 1

## 2017-12-16 MED ORDER — FENTANYL CITRATE (PF) 100 MCG/2ML IJ SOLN
25.0000 ug | INTRAMUSCULAR | Status: DC | PRN
  Administered 2017-12-16: 50 ug via INTRAVENOUS
  Filled 2017-12-16: qty 2

## 2017-12-16 MED ORDER — ROPIVACAINE HCL 2 MG/ML IJ SOLN
10.0000 mL | Freq: Once | INTRAMUSCULAR | Status: AC
  Administered 2017-12-16: 10 mL
  Filled 2017-12-16: qty 10

## 2017-12-16 MED ORDER — LIDOCAINE HCL 1 % IJ SOLN
10.0000 mL | Freq: Once | INTRAMUSCULAR | Status: AC
  Administered 2017-12-16: 5 mL
  Filled 2017-12-16: qty 10

## 2017-12-16 MED ORDER — LACTATED RINGERS IV SOLN
1000.0000 mL | Freq: Once | INTRAVENOUS | Status: AC
  Administered 2017-12-16: 1000 mL via INTRAVENOUS

## 2017-12-16 NOTE — Patient Instructions (Signed)

## 2017-12-16 NOTE — Progress Notes (Signed)
Patient's Name: Sarah Fisher  MRN: [id number]  Referring Provider: Ma Hillock, DO  DOB: Oct 10, 1960  PCP: Ma Hillock, DO  DOS: 12/16/2017  Note by: Gillis Santa, MD  Service setting: Ambulatory outpatient  Specialty: Interventional Pain Management  Patient type: Established  Location: [facility] (AMB) Pain Management Facility  Visit type: Interventional Procedure   Primary Reason for Visit: Interventional Pain Management Treatment. CC: Neck Pain (left sided)  Procedure:       Anesthesia, Analgesia, Anxiolysis:  Type: Cervical Facet Medial Branch Block(s)          Primary Purpose: Diagnostic Region: Posterolateral cervical spine Level: C4, C5, C6, & C7 Medial Branch Level(s). Injecting these levels blocks the  C4-5, C5-6, and C6-7 cervical facet joints. Laterality: Left-Sided Paraspinal  Type: Moderate (Conscious) Sedation combined with Local Anesthesia Indication(s): Analgesia and Anxiety Route: Intravenous (IV) IV Access: Secured Sedation: Meaningful verbal contact was maintained at all times during the procedure  Local Anesthetic: Lidocaine 1%   Indications: 1. Spondylosis of cervical region without myelopathy or radiculopathy    Pain Score: Pre-procedure: 4 /10 Post-procedure: 0-No pain/10  Pre-op Assessment:  Sarah Fisher is a 57 y.o. (year old), female patient, seen today for interventional treatment. She  has a past surgical history that includes Breast surgery (1986); Hand surgery (Bilateral, 2000 and 2016); cervical facet block (2010); Wisdom tooth extraction (1979); Mass excision (Left, 08/10/2017); I&D extremity (Left, 08/10/2017); and left finger surgery (Left, 08/2017). Sarah Fisher has a current medication list which includes the following prescription(s): cholecalciferol, diclofenac, gabapentin, glucos-chondroit-ca-mg-c-d, green tea, ibuprofen, losartan, multivitamin, multi-vitamin gummies, omega 3, pravastatin, aloe 10000 & probiotics, tizanidine, amoxicillin-clavulanate,  and tramadol, and the following Facility-Administered Medications: fentanyl. Her primarily concern today is the Neck Pain (left sided)  Initial Vital Signs:  Pulse/HCG Rate: 69ECG Heart Rate: 64 Temp: 98 F (36.7 C) Resp: 18 BP: (!) 142/103 SpO2: 99 %  BMI: Estimated body mass index is 29.1 kg/m as calculated from the following:   Height as of this encounter: 5' (1.524 m).   Weight as of this encounter: 149 lb (67.6 kg).  Risk Assessment: Allergies: Reviewed. She has No Known Allergies.  Allergy Precautions: None required Coagulopathies: Reviewed. None identified.  Blood-thinner therapy: None at this time Active Infection(s): Reviewed. None identified. Sarah Fisher is afebrile  Site Confirmation: Sarah Fisher was asked to confirm the procedure and laterality before marking the site Procedure checklist: Completed Consent: Before the procedure and under the influence of no sedative(s), amnesic(s), or anxiolytics, the patient was informed of the treatment options, risks and possible complications. To fulfill our ethical and legal obligations, as recommended by the American Medical Association's Code of Ethics, I have informed the patient of my clinical impression; the nature and purpose of the treatment or procedure; the risks, benefits, and possible complications of the intervention; the alternatives, including doing nothing; the risk(s) and benefit(s) of the alternative treatment(s) or procedure(s); and the risk(s) and benefit(s) of doing nothing. The patient was provided information about the general risks and possible complications associated with the procedure. These may include, but are not limited to: failure to achieve desired goals, infection, bleeding, organ or nerve damage, allergic reactions, paralysis, and death. In addition, the patient was informed of those risks and complications associated to Spine-related procedures, such as failure to decrease pain; infection (i.e.:  Meningitis, epidural or intraspinal abscess); bleeding (i.e.: epidural hematoma, subarachnoid hemorrhage, or any other type of intraspinal or peri-dural bleeding); organ or nerve damage (i.e.: Any type  of peripheral nerve, nerve root, or spinal cord injury) with subsequent damage to sensory, motor, and/or autonomic systems, resulting in permanent pain, numbness, and/or weakness of one or several areas of the body; allergic reactions; (i.e.: anaphylactic reaction); and/or death. Furthermore, the patient was informed of those risks and complications associated with the medications. These include, but are not limited to: allergic reactions (i.e.: anaphylactic or anaphylactoid reaction(s)); adrenal axis suppression; blood sugar elevation that in diabetics may result in ketoacidosis or comma; water retention that in patients with history of congestive heart failure may result in shortness of breath, pulmonary edema, and decompensation with resultant heart failure; weight gain; swelling or edema; medication-induced neural toxicity; particulate matter embolism and blood vessel occlusion with resultant organ, and/or nervous system infarction; and/or aseptic necrosis of one or more joints. Finally, the patient was informed that Medicine is not an exact science; therefore, there is also the possibility of unforeseen or unpredictable risks and/or possible complications that may result in a catastrophic outcome. The patient indicated having understood very clearly. We have given the patient no guarantees and we have made no promises. Enough time was given to the patient to ask questions, all of which were answered to the patient's satisfaction. Sarah Fisher has indicated that she wanted to continue with the procedure. Attestation: I, the ordering provider, attest that I have discussed with the patient the benefits, risks, side-effects, alternatives, likelihood of achieving goals, and potential problems during recovery for the  procedure that I have provided informed consent. Date  Time: 12/16/2017  9:43 AM  Pre-Procedure Preparation:  Monitoring: As per clinic protocol. Respiration, ETCO2, SpO2, BP, heart rate and rhythm monitor placed and checked for adequate function Safety Precautions: Patient was assessed for positional comfort and pressure points before starting the procedure. Time-out: I initiated and conducted the "Time-out" before starting the procedure, as per protocol. The patient was asked to participate by confirming the accuracy of the "Time Out" information. Verification of the correct person, site, and procedure were performed and confirmed by me, the nursing staff, and the patient. "Time-out" conducted as per Joint Commission's Universal Protocol (UP.01.01.01). Time: 1019  Description of Procedure:       Position: Prone with head of the table raised to facilitate breathing. Laterality: Left Level:  C4, C5, C6, & C7 Medial Branch Level(s). Area Prepped: Posterior Cervico-thoracic Region Prepping solution: ChloraPrep (2% chlorhexidine gluconate and 70% isopropyl alcohol) Safety Precautions: Aspiration looking for blood return was conducted prior to all injections. At no point did we inject any substances, as a needle was being advanced. Before injecting, the patient was told to immediately notify me if she was experiencing any new onset of "ringing in the ears, or metallic taste in the mouth". No attempts were made at seeking any paresthesias. Safe injection practices and needle disposal techniques used. Medications properly checked for expiration dates. SDV (single dose vial) medications used. After the completion of the procedure, all disposable equipment used was discarded in the proper designated medical waste containers. Local Anesthesia: Protocol guidelines were followed. The patient was positioned over the fluoroscopy table. The area was prepped in the usual manner. The time-out was completed. The  target area was identified using fluoroscopy. A 12-in long, straight, sterile hemostat was used with fluoroscopic guidance to locate the targets for each level blocked. Once located, the skin was marked with an approved surgical skin marker. Once all sites were marked, the skin (epidermis, dermis, and hypodermis), as well as deeper tissues (fat, connective tissue and  muscle) were infiltrated with a small amount of a short-acting local anesthetic, loaded on a 10cc syringe with a 25G, 1.5-in  Needle. An appropriate amount of time was allowed for local anesthetics to take effect before proceeding to the next step. Local Anesthetic: Lidocaine 1.0% The unused portion of the local anesthetic was discarded in the proper designated containers. Technical explanation of process:   C4 Medial Branch Nerve Block (MBB): The target area for the C4 dorsal medial articular branch is the lateral concave waist of the articular pillar of C4. Under fluoroscopic guidance, a Quincke needle was inserted until contact was made with os over the postero-lateral aspect of the articular pillar of C4 (target area). After negative aspiration for blood, 40mL of the nerve block solution was injected without difficulty or complication. The needle was removed intact. C5 Medial Branch Nerve Block (MBB): The target area for the C5 dorsal medial articular branch is the lateral concave waist of the articular pillar of C5. Under fluoroscopic guidance, a Quincke needle was inserted until contact was made with os over the postero-lateral aspect of the articular pillar of C5 (target area). After negative aspiration for blood, 61mL of the nerve block solution was injected without difficulty or complication. The needle was removed intact. C6 Medial Branch Nerve Block (MBB): The target area for the C6 dorsal medial articular branch is the lateral concave waist of the articular pillar of C6. Under fluoroscopic guidance, a Quincke needle was inserted until  contact was made with os over the postero-lateral aspect of the articular pillar of C6 (target area). After negative aspiration for blood, 66mL of the nerve block solution was injected without difficulty or complication. The needle was removed intact. C7 Medial Branch Nerve Block (MBB): The target for the C7 dorsal medial articular branch lies on the superior-medial tip of the C7 transverse process. Under fluoroscopic guidance, a Quincke needle was inserted until contact was made with os over the postero-lateral aspect of the articular pillar of C7 (target area). After negative aspiration for blood, 75mL of the nerve block solution was injected without difficulty or complication. The needle was removed intact. Procedural Needles: 22-gauge, 3.5-inch, Quincke needles used for all levels. Nerve block solution: 4 cc solution made of 3 cc of 0.2% ropivacaine, 1 cc of Decadron 10 mg/cc.  1 cc injected at each level above.  The unused portion of the solution was discarded in the proper designated containers.  Once the entire procedure was completed, the treated area was cleaned, making sure to leave some of the prepping solution back to take advantage of its long term bactericidal properties.  Vitals:   12/16/17 1028 12/16/17 1032 12/16/17 1042 12/16/17 1051  BP: (!) 166/110 (!) 152/115 (!) 151/94 (!) 180/98  Pulse:      Resp: 13 16 16 14   Temp:      TempSrc:      SpO2: 93% 95% 97% 100%  Weight:      Height:        Start Time: 1019 hrs. End Time: 1032 hrs.  Imaging Guidance (Spinal):  Type of Imaging Technique: Fluoroscopy Guidance (Spinal) Indication(s): Assistance in needle guidance and placement for procedures requiring needle placement in or near specific anatomical locations not easily accessible without such assistance. Exposure Time: Please see nurses notes. Contrast: None used. Fluoroscopic Guidance: I was personally present during the use of fluoroscopy. "Tunnel Vision Technique" used to  obtain the best possible view of the target area. Parallax error corrected before commencing the procedure. "  Direction-depth-direction" technique used to introduce the needle under continuous pulsed fluoroscopy. Once target was reached, antero-posterior, oblique, and lateral fluoroscopic projection used confirm needle placement in all planes. Images permanently stored in EMR. Interpretation: No contrast injected. I personally interpreted the imaging intraoperatively. Adequate needle placement confirmed in multiple planes. Permanent images saved into the patient's record.  Antibiotic Prophylaxis:   Anti-infectives (From admission, onward)   None     Indication(s): None identified  Post-operative Assessment:  Post-procedure Vital Signs:  Pulse/HCG Rate: 6961 Temp: 98 F (36.7 C) Resp: 14 BP: (!) 180/98 SpO2: 100 %  EBL: None  Complications: No immediate post-treatment complications observed by team, or reported by patient.  Note: The patient tolerated the entire procedure well. A repeat set of vitals were taken after the procedure and the patient was kept under observation following institutional policy, for this type of procedure. Post-procedural neurological assessment was performed, showing return to baseline, prior to discharge. The patient was provided with post-procedure discharge instructions, including a section on how to identify potential problems. Should any problems arise concerning this procedure, the patient was given instructions to immediately contact us, at any time, without hesitation. In any case, we plan to contact the patient by telephone for a follow-up status report regarding this interventional procedure.  Comments:  No additional relevant information. 5 out of 5 strength L upper extremity: Shoulder abduction, elbow flexion, elbow extension, thumb extension.  Plan of Care   Imaging Orders     DG C-Arm 1-60 Min-No Report Procedure Orders    No procedure(s)  ordered today    Medications ordered for procedure: Meds ordered this encounter  Medications  . lactated ringers infusion 1,000 mL  . fentaNYL (SUBLIMAZE) injection 25-100 mcg    Make sure Narcan is available in the pyxis when using this medication. In the event of respiratory depression (RR< 8/min): Titrate NARCAN (naloxone) in increments of 0.1 to 0.2 mg IV at 2-3 minute intervals, until desired degree of reversal.  . lidocaine (XYLOCAINE) 1 % (with pres) injection 10 mL  . ropivacaine (PF) 2 mg/mL (0.2%) (NAROPIN) injection 10 mL  . dexamethasone (DECADRON) injection 10 mg   Medications administered: We administered lactated ringers, fentaNYL, lidocaine, ropivacaine (PF) 2 mg/mL (0.2%), and dexamethasone.  See the medical record for exact dosing, route, and time of administration.  New Prescriptions   No medications on file   Disposition: Discharge home  Discharge Date & Time: 12/16/2017;   hrs.   Physician-requested Follow-up: Return in about 5 weeks (around 01/20/2018) for Post Procedure Evaluation.  Future Appointments  Date Time Provider [place]  01/20/2018 12:30 PM Gillis Santa, MD [facility] None  02/24/2018  9:45 AM Vevelyn Francois, NP [facility] None   Primary Care Physician: Ma Hillock, DO Location: [facility] Note by: Gillis Santa, MD Date: 12/16/2017; Time: 2:56 PM  Disclaimer:  Medicine is not an exact science. The only guarantee in medicine is that nothing is guaranteed. It is important to note that the decision to proceed with this intervention was based on the information collected from the patient. The Data and conclusions were drawn from the patient's questionnaire, the interview, and the physical examination. Because the information was provided in large part by the patient, it cannot be guaranteed that it has not been purposely or unconsciously manipulated. Every effort has been made to obtain as much relevant data as  possible for this evaluation. It is important to note that the conclusions that lead to  this procedure are derived in large part from the available data. Always take into account that the treatment will also be dependent on availability of resources and existing treatment guidelines, considered by other Pain Management Practitioners as being common knowledge and practice, at the time of the intervention. For Medico-Legal purposes, it is also important to point out that variation in procedural techniques and pharmacological choices are the acceptable norm. The indications, contraindications, technique, and results of the above procedure should only be interpreted and judged by a Board-Certified Interventional Pain Specialist with extensive familiarity and expertise in the same exact procedure and technique.

## 2017-12-16 NOTE — Progress Notes (Signed)
Safety precautions to be maintained throughout the outpatient stay will include: orient to surroundings, keep bed in low position, maintain call bell within reach at all times, provide assistance with transfer out of bed and ambulation.  

## 2017-12-16 NOTE — Progress Notes (Signed)
C/o itching and tingling in right arm.  Non raised rash noted to right hand.  Dr Holley Raring notified.  Benadryl 25 mg IV given by D. [employer]. 1054, states right arm feels better.  Rash has decreased.

## 2017-12-17 ENCOUNTER — Telehealth: Payer: Self-pay | Admitting: *Deleted

## 2017-12-17 NOTE — Telephone Encounter (Signed)
No problems post procedure. 

## 2017-12-28 ENCOUNTER — Other Ambulatory Visit: Payer: Self-pay | Admitting: Family Medicine

## 2018-01-20 ENCOUNTER — Ambulatory Visit: Payer: BLUE CROSS/BLUE SHIELD | Admitting: Student in an Organized Health Care Education/Training Program

## 2018-01-27 ENCOUNTER — Other Ambulatory Visit: Payer: Self-pay | Admitting: Student in an Organized Health Care Education/Training Program

## 2018-02-09 ENCOUNTER — Other Ambulatory Visit: Payer: Self-pay

## 2018-02-09 ENCOUNTER — Encounter: Payer: Self-pay | Admitting: Student in an Organized Health Care Education/Training Program

## 2018-02-09 ENCOUNTER — Ambulatory Visit
Payer: BLUE CROSS/BLUE SHIELD | Attending: Student in an Organized Health Care Education/Training Program | Admitting: Student in an Organized Health Care Education/Training Program

## 2018-02-09 VITALS — BP 134/94 | HR 70 | Temp 98.4°F | Resp 18 | Ht 60.0 in | Wt 152.0 lb

## 2018-02-09 DIAGNOSIS — M06011 Rheumatoid arthritis without rheumatoid factor, right shoulder: Secondary | ICD-10-CM | POA: Insufficient documentation

## 2018-02-09 DIAGNOSIS — M06012 Rheumatoid arthritis without rheumatoid factor, left shoulder: Secondary | ICD-10-CM | POA: Insufficient documentation

## 2018-02-09 DIAGNOSIS — M419 Scoliosis, unspecified: Secondary | ICD-10-CM | POA: Insufficient documentation

## 2018-02-09 DIAGNOSIS — M47812 Spondylosis without myelopathy or radiculopathy, cervical region: Secondary | ICD-10-CM | POA: Diagnosis not present

## 2018-02-09 DIAGNOSIS — Z79899 Other long term (current) drug therapy: Secondary | ICD-10-CM | POA: Insufficient documentation

## 2018-02-09 DIAGNOSIS — Z86010 Personal history of colonic polyps: Secondary | ICD-10-CM | POA: Insufficient documentation

## 2018-02-09 DIAGNOSIS — Z8249 Family history of ischemic heart disease and other diseases of the circulatory system: Secondary | ICD-10-CM | POA: Diagnosis not present

## 2018-02-09 DIAGNOSIS — Z79891 Long term (current) use of opiate analgesic: Secondary | ICD-10-CM | POA: Insufficient documentation

## 2018-02-09 DIAGNOSIS — E559 Vitamin D deficiency, unspecified: Secondary | ICD-10-CM | POA: Insufficient documentation

## 2018-02-09 DIAGNOSIS — G8929 Other chronic pain: Secondary | ICD-10-CM

## 2018-02-09 DIAGNOSIS — M7918 Myalgia, other site: Secondary | ICD-10-CM

## 2018-02-09 DIAGNOSIS — M545 Low back pain: Secondary | ICD-10-CM | POA: Insufficient documentation

## 2018-02-09 DIAGNOSIS — M542 Cervicalgia: Secondary | ICD-10-CM

## 2018-02-09 DIAGNOSIS — E785 Hyperlipidemia, unspecified: Secondary | ICD-10-CM | POA: Diagnosis not present

## 2018-02-09 DIAGNOSIS — Z8261 Family history of arthritis: Secondary | ICD-10-CM | POA: Insufficient documentation

## 2018-02-09 DIAGNOSIS — M170 Bilateral primary osteoarthritis of knee: Secondary | ICD-10-CM | POA: Diagnosis not present

## 2018-02-09 DIAGNOSIS — M50122 Cervical disc disorder at C5-C6 level with radiculopathy: Secondary | ICD-10-CM | POA: Diagnosis not present

## 2018-02-09 DIAGNOSIS — Z85828 Personal history of other malignant neoplasm of skin: Secondary | ICD-10-CM | POA: Diagnosis not present

## 2018-02-09 DIAGNOSIS — M25511 Pain in right shoulder: Secondary | ICD-10-CM | POA: Insufficient documentation

## 2018-02-09 DIAGNOSIS — M4802 Spinal stenosis, cervical region: Secondary | ICD-10-CM | POA: Insufficient documentation

## 2018-02-09 DIAGNOSIS — Z6829 Body mass index (BMI) 29.0-29.9, adult: Secondary | ICD-10-CM | POA: Insufficient documentation

## 2018-02-09 DIAGNOSIS — E663 Overweight: Secondary | ICD-10-CM | POA: Insufficient documentation

## 2018-02-09 DIAGNOSIS — M25512 Pain in left shoulder: Secondary | ICD-10-CM | POA: Diagnosis not present

## 2018-02-09 DIAGNOSIS — K5904 Chronic idiopathic constipation: Secondary | ICD-10-CM | POA: Insufficient documentation

## 2018-02-09 DIAGNOSIS — M533 Sacrococcygeal disorders, not elsewhere classified: Secondary | ICD-10-CM | POA: Insufficient documentation

## 2018-02-09 DIAGNOSIS — M5412 Radiculopathy, cervical region: Secondary | ICD-10-CM | POA: Diagnosis not present

## 2018-02-09 DIAGNOSIS — M4722 Other spondylosis with radiculopathy, cervical region: Secondary | ICD-10-CM | POA: Insufficient documentation

## 2018-02-09 DIAGNOSIS — G894 Chronic pain syndrome: Secondary | ICD-10-CM | POA: Diagnosis not present

## 2018-02-09 DIAGNOSIS — Z800 Family history of malignant neoplasm of digestive organs: Secondary | ICD-10-CM | POA: Insufficient documentation

## 2018-02-09 DIAGNOSIS — I10 Essential (primary) hypertension: Secondary | ICD-10-CM | POA: Insufficient documentation

## 2018-02-09 MED ORDER — METHYLPREDNISOLONE 4 MG PO TBPK: ORAL_TABLET | ORAL | 0 refills | Status: AC

## 2018-02-09 NOTE — Patient Instructions (Signed)
yOu have a prescription to be picked u p at your pharmacy.  Follow instructions on bottle.

## 2018-02-09 NOTE — Progress Notes (Signed)
Patient's Name: Sarah Fisher  MRN: [id number]  Referring Provider: Ma Hillock, DO  DOB: 11-Jul-1960  PCP: Ma Hillock, DO  DOS: 02/09/2018  Note by: Gillis Santa, MD  Service setting: Ambulatory outpatient  Specialty: Interventional Pain Management  Location: [facility] (AMB) Pain Management Facility    Patient type: Established   Primary Reason(s) for Visit: Encounter for post-procedure evaluation of chronic illness with mild to moderate exacerbation CC: Neck Pain  HPI  Sarah Fisher is a 57 y.o. year old, female patient, who comes today for a post-procedure evaluation. She has Hypertension; Hyperlipidemia; Overweight; Vitamin D deficiency; Benign paroxysmal positional vertigo of left ear; Adenomatous polyp of colon; Chronic pain syndrome; Osteoarthritis of cervical spine; Cervical DDD (degenerative disc disease) (C5-6); Cervical central spinal stenosis C5-6 (Right); Chronic neck pain (Location of Primary Source of Pain) (Bilateral) (L>R); Cervicogenic headache (Bilateral) (R>L); Occipital headache (Bilateral) (R>L); Neurogenic pain; Myofascial pain; Chronic shoulder pain (Location of Tertiary source of pain) (Bilateral) (L>R); Chronic upper back pain (Location of Secondary source of pain) (Bilateral) (L>R); Chronic low back pain (Bilateral) (L>R); Cervical spondylosis; Numbness of upper extremity (Bilateral) (L>R); Chronic cervical radiculopathy (Bilateral) (L>R); Chronic knee pain (Bilateral) (R>L); Osteoarthritis of knee (Bilateral) (R>L); Rheumatoid arthritis, seronegative, shoulder region (Bilateral) (L>R); Chronic idiopathic constipation; and Long term (current) use of opiate analgesic on their problem list. Her primarily concern today is the Neck Pain  Pain Assessment: Location: Left Neck Radiating: radiates to left shoulder  Onset: More than a month ago Duration: Chronic pain Quality: Constant, Tightness(pulling) Severity: 5 /10 (subjective, self-reported pain score)  Note: Reported level  is compatible with observation.                         When using our objective Pain Scale, levels between 6 and 10/10 are said to belong in an emergency room, as it progressively worsens from a 6/10, described as severely limiting, requiring emergency care not usually available at an outpatient pain management facility. At a 6/10 level, communication becomes difficult and requires great effort. Assistance to reach the emergency department may be required. Facial flushing and profuse sweating along with potentially dangerous increases in heart rate and blood pressure will be evident. Effect on ADL:   Timing: Constant Modifying factors: heat, medications,  BP: (!) 134/94  HR: 70  Sarah Fisher comes in today for post-procedure evaluation after the treatment done on 01/27/2018.  Further details on both, my assessment(s), as well as the proposed treatment plan, please see below.  Post-Procedure Assessment  01/27/2018 Procedure: Left C4, 5, 6, 7 medial branch nerve block Pre-procedure pain score:  4/10 Post-procedure pain score: 0/10         Influential Factors: BMI: 29.69 kg/m Intra-procedural challenges: None observed.         Assessment challenges: None detected.              Reported side-effects: None.        Post-procedural adverse reactions or complications: None reported         Sedation: Please see nurses note. When no sedatives are used, the analgesic levels obtained are directly associated to the effectiveness of the local anesthetics. However, when sedation is provided, the level of analgesia obtained during the initial 1 hour following the intervention, is believed to be the result of a combination of factors. These factors may include, but are not limited to: 1. The effectiveness of the local anesthetics used. 2. The effects of  the analgesic(s) and/or anxiolytic(s) used. 3. The degree of discomfort experienced by the patient at the time of the procedure. 4. The patients ability and  reliability in recalling and recording the events. 5. The presence and influence of possible secondary gains and/or psychosocial factors. Reported result: Relief experienced during the 1st hour after the procedure: 20 % (Ultra-Short Term Relief)            Interpretative annotation: Clinically appropriate result. Analgesia during this period is likely to be Local Anesthetic and/or IV Sedative (Analgesic/Anxiolytic) related.          Effects of local anesthetic: The analgesic effects attained during this period are directly associated to the localized infiltration of local anesthetics and therefore cary significant diagnostic value as to the etiological location, or anatomical origin, of the pain. Expected duration of relief is directly dependent on the pharmacodynamics of the local anesthetic used. Long-acting (4-6 hours) anesthetics used.  Reported result: Relief during the next 4 to 6 hour after the procedure: 0 % (Short-Term Relief)            Interpretative annotation: Unexpected result. Analgesia during this period is likely to be Local Anesthetic-related.          Long-term benefit: Defined as the period of time past the expected duration of local anesthetics (1 hour for short-acting and 4-6 hours for long-acting). With the possible exception of prolonged sympathetic blockade from the local anesthetics, benefits during this period are typically attributed to, or associated with, other factors such as analgesic sensory neuropraxia, antiinflammatory effects, or beneficial biochemical changes provided by agents other than the local anesthetics.  Reported result: Extended relief following procedure: 0 % (Long-Term Relief)            Interpretative annotation: Unexpected result. No benefit. No permanent benefit expected. Pain appears to be refractory to this treatment modality.          Current benefits: Defined as reported results that persistent at this point in time.   Analgesia: 0 %             Function: No benefit ROM: No benefit Interpretative annotation: No benefit. Therapeutic failure. Results would argue against repeating therapy.          Interpretation: Results would suggest failure of therapy in achieving desired goal(s).                  Plan:  Please see "Plan of Care" for details.                Laboratory Chemistry  Inflammation Markers (CRP: Acute Phase) (ESR: Chronic Phase) Lab Results  Component Value Date   CRP <0.8 10/01/2016   ESRSEDRATE 21 10/01/2016                         Rheumatology Markers Lab Results  Component Value Date   RF <10.0 10/01/2016   ANA Negative 10/01/2016                        Renal Function Markers Lab Results  Component Value Date   BUN 18 06/10/2017   CREATININE 0.67 06/10/2017   GFRAA >60 10/01/2016   GFRNONAA >60 10/01/2016                             Hepatic Function Markers Lab Results  Component Value Date   AST 17  06/10/2017   ALT 16 06/10/2017   ALBUMIN 4.7 06/10/2017   ALKPHOS 63 06/10/2017   HCVAB NEGATIVE 05/15/2016                        Electrolytes Lab Results  Component Value Date   NA 142 06/10/2017   K 4.2 06/10/2017   CL 103 06/10/2017   CALCIUM 9.6 06/10/2017   MG 2.2 10/01/2016                        Neuropathy Markers Lab Results  Component Value Date   GDJMEQAS34 196 10/01/2016   HGBA1C 5.4 06/10/2017   HIV NONREACTIVE 05/15/2016                        Bone Pathology Markers Lab Results  Component Value Date   VD25OH 27.56 (L) 06/10/2017   25OHVITD1 40 10/01/2016   25OHVITD2 18 10/01/2016   25OHVITD3 22 10/01/2016                         Coagulation Parameters Lab Results  Component Value Date   PLT 322.0 06/10/2017                        Cardiovascular Markers Lab Results  Component Value Date   HGB 12.7 06/10/2017   HCT 38.8 06/10/2017                         CA Markers No results found for: CEA, CA125, LABCA2                      Note: Lab results  reviewed.  Recent Diagnostic Imaging Results  DG C-Arm 1-60 Min-No Report Fluoroscopy was utilized by the requesting physician.  No radiographic  interpretation.   Complexity Note: Imaging results reviewed. Results shared with Ms. Pirie, using Layman's terms.                         Meds   Current Outpatient Medications:  .  gabapentin (NEURONTIN) 300 MG capsule, 300 mg qday, 600 mg qhs, Disp: 90 capsule, Rfl: 2 .  ibuprofen (ADVIL,MOTRIN) 200 MG tablet, Take 400 mg by mouth every 4 (four) hours as needed. , Disp: , Rfl:  .  losartan (COZAAR) 50 MG tablet, TAKE 1 TABLET BY MOUTH DAILY, Disp: 14 tablet, Rfl: 0 .  Omega 3 1000 MG CAPS, Take 1 capsule by mouth daily., Disp: , Rfl:  .  pravastatin (PRAVACHOL) 10 MG tablet, Take 1 tablet (10 mg total) by mouth daily., Disp: 90 tablet, Rfl: 3 .  tizanidine (ZANAFLEX) 2 MG capsule, Take 1 capsule (2 mg total) by mouth 3 (three) times daily as needed for muscle spasms., Disp: 90 capsule, Rfl: 5 .  amoxicillin-clavulanate (AUGMENTIN) 875-125 MG tablet, Take 1 tablet by mouth 2 (two) times daily. (Patient not taking: Reported on 12/16/2017), Disp: 20 tablet, Rfl: 0 .  cholecalciferol (VITAMIN D) 1000 units tablet, Take 1,000 Units by mouth daily., Disp: , Rfl:  .  diclofenac (VOLTAREN) 75 MG EC tablet, Take 1 tablet (75 mg total) by mouth 2 (two) times daily after a meal. (Patient not taking: Reported on 02/09/2018), Disp: 60 tablet, Rfl: 1 .  GLUCOS-CHONDROIT-CA-MG-C-D PO, Take by mouth daily., Disp: , Rfl:  .  Green Tea 315 MG CAPS, Take by mouth daily., Disp: , Rfl:  .  methylPREDNISolone (MEDROL) 4 MG TBPK tablet, Follow package instructions., Disp: 21 tablet, Rfl: 0 .  Multiple Vitamin (MULTIVITAMIN) LIQD, Take 5 mLs by mouth daily., Disp: , Rfl:  .  Multiple Vitamins-Minerals (MULTI-VITAMIN GUMMIES) CHEW, Chew 1 Units by mouth daily., Disp: , Rfl:  .  Probiotic Product (ALOE 50354 & PROBIOTICS) CAPS, Take by mouth., Disp: , Rfl:  .  traMADol  (ULTRAM) 50 MG tablet, Take 1 tablet (50 mg total) by mouth every 6 (six) hours as needed for severe pain. (Patient not taking: Reported on 12/16/2017), Disp: 120 tablet, Rfl: 2  ROS  Constitutional: Denies any fever or chills Gastrointestinal: No reported hemesis, hematochezia, vomiting, or acute GI distress Musculoskeletal: Denies any acute onset joint swelling, redness, loss of ROM, or weakness Neurological: No reported episodes of acute onset apraxia, aphasia, dysarthria, agnosia, amnesia, paralysis, loss of coordination, or loss of consciousness  Allergies  Ms. Pokorny has No Known Allergies.  [place]  Drug: Ms. Flores  reports that she does not use drugs. Alcohol:  reports that she does not drink alcohol. Tobacco:  reports that she has never smoked. She has never used smokeless tobacco. Medical:  has a past medical history of Anemia, Arthritis, Cervical spine degeneration (2010), History of carpal tunnel syndrome, Hyperlipidemia, Hypertension, Sacroiliac joint pain (2010), Scoliosis, and Skin cancer of eyelid. Surgical: Ms. Wassmer  has a past surgical history that includes Breast surgery (1986); Hand surgery (Bilateral, 2000 and 2016); cervical facet block (2010); Wisdom tooth extraction (1979); Mass excision (Left, 08/10/2017); I&D extremity (Left, 08/10/2017); and left finger surgery (Left, 08/2017). Family: family history includes Alzheimer's disease in her maternal grandfather and paternal grandmother; Arthritis in her maternal aunt and mother; Colon cancer (age of onset: 49) in her maternal grandfather; Diabetes in her maternal grandfather; Hypertension in her father, maternal grandmother, mother, and sister; Kidney cancer in her maternal grandmother; Liver cancer in her father.  Constitutional Exam  General appearance: Well nourished, well developed, and well hydrated. In no apparent acute distress Vitals:   02/09/18 1313  BP: (!) 134/94  Pulse: 70  Resp: 18  Temp: 98.4 F (36.9 C)   SpO2: 100%  Weight: 152 lb (68.9 kg)  Height: 5' (1.524 m)   BMI Assessment: Estimated body mass index is 29.69 kg/m as calculated from the following:   Height as of this encounter: 5' (1.524 m).   Weight as of this encounter: 152 lb (68.9 kg).  BMI interpretation table: BMI level Category Range association with higher incidence of chronic pain  <18 kg/m2 Underweight   18.5-24.9 kg/m2 Ideal body weight   25-29.9 kg/m2 Overweight Increased incidence by 20%  30-34.9 kg/m2 Obese (Class I) Increased incidence by 68%  35-39.9 kg/m2 Severe obesity (Class II) Increased incidence by 136%  >40 kg/m2 Extreme obesity (Class III) Increased incidence by 254%   Patient's current BMI Ideal Body weight  Body mass index is 29.69 kg/m. Ideal body weight: 45.5 kg (100 lb 4.9 oz) Adjusted ideal body weight: 54.9 kg (120 lb 15.8 oz)   BMI Readings from Last 4 Encounters:  02/09/18 29.69 kg/m  12/16/17 29.10 kg/m  11/26/17 29.29 kg/m  11/25/17 28.71 kg/m   Wt Readings from Last 4 Encounters:  02/09/18 152 lb (68.9 kg)  12/16/17 149 lb (67.6 kg)  11/26/17 150 lb (68 kg)  11/25/17 147 lb (66.7 kg)  Psych/Mental status: Alert, oriented x 3 (person, place, & time)  Eyes: PERLA Respiratory: No evidence of acute respiratory distress  Cervical Spine Area Exam  Skin & Axial Inspection: No masses, redness, edema, swelling, or associated skin lesions Alignment: Symmetrical Functional ROM: Decreased ROM      Stability: No instability detected Muscle Tone/Strength: Functionally intact. No obvious neuro-muscular anomalies detected. Sensory (Neurological): Musculoskeletal pain pattern Palpation: Complains of area being tender to palpation Positive provocative maneuver for for cervical facet disease left greater than right  Upper Extremity (UE) Exam    Side: Right upper extremity  Side: Left upper extremity  Skin & Extremity Inspection: Skin color, temperature, and hair growth are WNL. No  peripheral edema or cyanosis. No masses, redness, swelling, asymmetry, or associated skin lesions. No contractures.  Skin & Extremity Inspection: Skin color, temperature, and hair growth are WNL. No peripheral edema or cyanosis. No masses, redness, swelling, asymmetry, or associated skin lesions. No contractures.  Functional ROM: Unrestricted ROM          Functional ROM: Unrestricted ROM          Muscle Tone/Strength: Functionally intact. No obvious neuro-muscular anomalies detected.  Muscle Tone/Strength: Functionally intact. No obvious neuro-muscular anomalies detected.  Sensory (Neurological): Unimpaired          Sensory (Neurological): Unimpaired          Palpation: No palpable anomalies              Palpation: No palpable anomalies              Provocative Test(s):  Phalen's test: deferred Tinel's test: deferred Apley's scratch test (touch opposite shoulder):  Action 1 (Across chest): deferred Action 2 (Overhead): deferred Action 3 (LB reach): deferred   Provocative Test(s):  Phalen's test: deferred Tinel's test: deferred Apley's scratch test (touch opposite shoulder):  Action 1 (Across chest): deferred Action 2 (Overhead): deferred Action 3 (LB reach): deferred    Thoracic Spine Area Exam  Skin & Axial Inspection: No masses, redness, or swelling Alignment: Symmetrical Functional ROM: Unrestricted ROM Stability: No instability detected Muscle Tone/Strength: Functionally intact. No obvious neuro-muscular anomalies detected. Sensory (Neurological): Unimpaired Muscle strength & Tone: No palpable anomalies  Lumbar Spine Area Exam  Skin & Axial Inspection: No masses, redness, or swelling Alignment: Symmetrical Functional ROM: Unrestricted ROM       Stability: No instability detected Muscle Tone/Strength: Functionally intact. No obvious neuro-muscular anomalies detected. Sensory (Neurological): Unimpaired Palpation: No palpable anomalies       Provocative  Tests: Hyperextension/rotation test: deferred today       Lumbar quadrant test (Kemp's test): deferred today       Lateral bending test: deferred today       Patrick's Maneuver: deferred today                   FABER test: deferred today                   S-I anterior distraction/compression test: deferred today         S-I lateral compression test: deferred today         S-I Thigh-thrust test: deferred today         S-I Gaenslen's test: deferred today           Gait & Posture Assessment  Ambulation: Unassisted Gait: Relatively normal for age and body habitus Posture: WNL   Lower Extremity Exam    Side: Right lower extremity  Side: Left lower extremity  Stability: No instability observed  Stability: No instability observed          Skin & Extremity Inspection: Skin color, temperature, and hair growth are WNL. No peripheral edema or cyanosis. No masses, redness, swelling, asymmetry, or associated skin lesions. No contractures.  Skin & Extremity Inspection: Skin color, temperature, and hair growth are WNL. No peripheral edema or cyanosis. No masses, redness, swelling, asymmetry, or associated skin lesions. No contractures.  Functional ROM: Unrestricted ROM                  Functional ROM: Unrestricted ROM                  Muscle Tone/Strength: Functionally intact. No obvious neuro-muscular anomalies detected.  Muscle Tone/Strength: Functionally intact. No obvious neuro-muscular anomalies detected.  Sensory (Neurological): Unimpaired  Sensory (Neurological): Unimpaired  Palpation: No palpable anomalies  Palpation: No palpable anomalies   Assessment  Primary Diagnosis & Pertinent Problem List: The primary encounter diagnosis was Spondylosis of cervical region without myelopathy or radiculopathy. Diagnoses of Cervical facet syndrome, Chronic neck pain (Location of Primary Source of Pain) (Bilateral) (L>R), Chronic cervical radiculopathy (Bilateral) (L>R), Cervical central spinal  stenosis C5-6 (Right), Chronic pain syndrome, and Myofascial pain were also pertinent to this visit.  Status Diagnosis  Persistent Persistent Persistent 1. Spondylosis of cervical region without myelopathy or radiculopathy   2. Cervical facet syndrome   3. Chronic neck pain (Location of Primary Source of Pain) (Bilateral) (L>R)   4. Chronic cervical radiculopathy (Bilateral) (L>R)   5. Cervical central spinal stenosis C5-6 (Right)   6. Chronic pain syndrome   7. Myofascial pain      General Recommendations: The pain condition that the patient suffers from is best treated with a multidisciplinary approach that involves an increase in physical activity to prevent de-conditioning and worsening of the pain cycle, as well as psychological counseling (formal and/or informal) to address the co-morbid psychological affects of pain. Treatment will often involve judicious use of pain medications and interventional procedures to decrease the pain, allowing the patient to participate in the physical activity that will ultimately produce long-lasting pain reductions. The goal of the multidisciplinary approach is to return the patient to a higher level of overall function and to restore their ability to perform activities of daily living.  57 year old female with a history of left-sided axial neck pain that radiates to her periscapular and shoulder region secondary to cervical spondylosis and facet arthropathy.  Patient also has a history of left cervical radiculopathy which is currently under satisfactory control after left C7-T1 epidural steroid injection which is still helping the patient with her left upper extremity radicular symptoms.  Patient's left neck and shoulder pain symptoms seem musculoskeletal and arthropathic in nature.  In regards to treatment options, I discussed a 5-day steroid taper of Medrol Dosepak to help with her pain.  Also recommended the patient to look into acupuncture or acupressure.   I provided her with a referral to a clinic in [facility] I can help out with acupuncture.  Plan: -Medrol Dosepak as below -Recommend acupuncture versus Accu pressure and/or deep tissue massage for myofascial and musculoskeletal pain.  Plan of Care  Pharmacotherapy (Medications Ordered): Meds ordered this encounter  Medications  . methylPREDNISolone (MEDROL) 4 MG TBPK tablet    Sig: Follow package instructions.    Dispense:  21 tablet    Refill:  0    Do not add to the "Automatic Refill" notification system.   Time Note: Greater than 50%  of the 25 minute(s) of face-to-face time spent with Ms. Reede, was spent in counseling/coordination of care regarding: Ms. Suen primary cause of pain, the treatment plan, treatment alternatives, medication side effects and the results, interpretation and significance of  her recent diagnostic interventional treatment(s).  Provider-requested follow-up: Return in about 5 weeks (around 03/16/2018) for Medication Management.  Future Appointments  Date Time Provider [place]  03/16/2018  1:30 PM Gillis Santa, MD [facility] None    Primary Care Physician: Ma Hillock, DO Location: [facility] Note by: Gillis Santa, M.D Date: 02/09/2018; Time: 3:05 PM  Patient Instructions  yOu have a prescription to be picked u p at your pharmacy.  Follow instructions on bottle.

## 2018-02-09 NOTE — Progress Notes (Signed)
Safety precautions to be maintained throughout the outpatient stay will include: orient to surroundings, keep bed in low position, maintain call bell within reach at all times, provide assistance with transfer out of bed and ambulation.  

## 2018-02-17 ENCOUNTER — Ambulatory Visit: Payer: BLUE CROSS/BLUE SHIELD | Admitting: Family Medicine

## 2018-02-17 ENCOUNTER — Encounter: Payer: Self-pay | Admitting: Family Medicine

## 2018-02-17 VITALS — BP 108/75 | HR 84 | Temp 98.4°F | Resp 16 | Ht 60.0 in | Wt 151.0 lb

## 2018-02-17 DIAGNOSIS — Z124 Encounter for screening for malignant neoplasm of cervix: Secondary | ICD-10-CM

## 2018-02-17 DIAGNOSIS — Z780 Asymptomatic menopausal state: Secondary | ICD-10-CM | POA: Diagnosis not present

## 2018-02-17 DIAGNOSIS — I10 Essential (primary) hypertension: Secondary | ICD-10-CM | POA: Diagnosis not present

## 2018-02-17 DIAGNOSIS — E785 Hyperlipidemia, unspecified: Secondary | ICD-10-CM | POA: Diagnosis not present

## 2018-02-17 MED ORDER — LOSARTAN POTASSIUM 50 MG PO TABS: 50.0000 mg | ORAL_TABLET | Freq: Every day | ORAL | 1 refills | Status: DC

## 2018-02-17 NOTE — Patient Instructions (Signed)
It was nice to see you today. I have placed the referral to gynecology for you, they will call you to schedule.  I have refilled your medication today.  Your blood pressure looks great.   See you in 6 months for follow up.   Please help [place] help you:  We are honored you have chosen [facility] for your Primary [facility] home. Below you will find basic instructions that you may need to access in the future. Please help [place] help you by reading the instructions, which cover many of the frequent questions we experience.   Prescription refills and request:  -In order to allow more efficient response time, please call your pharmacy for all refills. They will forward the request electronically to [place]. This allows for the quickest possible response. Request left on a nurse line can take longer to refill, since these are checked as time allows between office patients and other phone calls.  - refill request can take up to 3-5 working days to complete.  - If request is sent electronically and request is appropiate, it is usually completed in 1-2 business days.  - all patients will need to be seen routinely for all chronic medical conditions requiring prescription medications (see follow-up below). If you are overdue for follow up on your condition, you will be asked to make an appointment and we will call in enough medication to cover you until your appointment (up to 30 days).  - all controlled substances will require a face to face visit to request/refill.  - if you desire your prescriptions to go through a new pharmacy, and have an active script at original pharmacy, you will need to call your pharmacy and have scripts transferred to new pharmacy. This is completed between the pharmacy locations and not by your provider.    Results: If any images or labs were ordered, it can take up to 1 week to get results depending on the test ordered and the lab/facility running and resulting the test. - Normal or  stable results, which do not need further discussion, may be released to your mychart immediately with attached note to you. A call may not be generated for normal results. Please make certain to sign up for mychart. If you have questions on how to activate your mychart you can call the front office.  - If your results need further discussion, our office will attempt to contact you via phone, and if unable to reach you after 2 attempts, we will release your abnormal result to your mychart with instructions.  - All results will be automatically released in mychart after 1 week.  - Your provider will provide you with explanation and instruction on all relevant material in your results. Please keep in mind, results and labs may appear confusing or abnormal to the untrained eye, but it does not mean they are actually abnormal for you personally. If you have any questions about your results that are not covered, or you desire more detailed explanation than what was provided, you should make an appointment with your provider to do so.   Our office handles many outgoing and incoming calls daily. If we have not contacted you within 1 week about your results, please check your mychart to see if there is a message first and if not, then contact our office.  In helping with this matter, you help decrease call volume, and therefore allow [place] to be able to respond to patients needs more efficiently.  Acute office visits (sick visit):  An acute visit is intended for a new problem and are scheduled in shorter time slots to allow schedule openings for patients with new problems. This is the appropriate visit to discuss a new problem. Problems will not be addressed by phone call or Echart message. Appointment is needed if requesting treatment. In order to provide you with excellent quality medical care with proper time for you to explain your problem, have an exam and receive treatment with instructions, these appointments  should be limited to one new problem per visit. If you experience a new problem, in which you desire to be addressed, please make an acute office visit, we save openings on the schedule to accommodate you. Please do not save your new problem for any other type of visit, let us take care of it properly and quickly for you.   Follow up visits:  Depending on your condition(s) your provider will need to see you routinely in order to provide you with quality care and prescribe medication(s). Most chronic conditions (Example: hypertension, Diabetes, depression/anxiety... etc), require visits a couple times a year. Your provider will instruct you on proper follow up for your personal medical conditions and history. Please make certain to make follow up appointments for your condition as instructed. Failing to do so could result in lapse in your medication treatment/refills. If you request a refill, and are overdue to be seen on a condition, we will always provide you with a 30 day script (once) to allow you time to schedule.    Medicare wellness (well visit): - we have a wonderful Nurse Maudie Mercury), that will meet with you and provide you will yearly medicare wellness visits. These visits should occur yearly (can not be scheduled less than 1 calendar year apart) and cover preventive health, immunizations, advance directives and screenings you are entitled to yearly through your medicare benefits. Do not miss out on your entitled benefits, this is when medicare will pay for these benefits to be ordered for you.  These are strongly encouraged by your provider and is the appropriate type of visit to make certain you are up to date with all preventive health benefits. If you have not had your medicare wellness exam in the last 12 months, please make certain to schedule one by calling the office and schedule your medicare wellness with Maudie Mercury as soon as possible.   Yearly physical (well visit):  - Adults are recommended to be  seen yearly for physicals. Check with your insurance and date of your last physical, most insurances require one calendar year between physicals. Physicals include all preventive health topics, screenings, medical exam and labs that are appropriate for gender/age and history. You may have fasting labs needed at this visit. This is a well visit (not a sick visit), new problems should not be covered during this visit (see acute visit).  - Pediatric patients are seen more frequently when they are younger. Your provider will advise you on well child visit timing that is appropriate for your their age. - This is not a medicare wellness visit. Medicare wellness exams do not have an exam portion to the visit. Some medicare companies allow for a physical, some do not allow a yearly physical. If your medicare allows a yearly physical you can schedule the medicare wellness with our nurse Maudie Mercury and have your physical with your provider after, on the same day. Please check with insurance for your full benefits.   Late Policy/No Shows:  -  all new patients should arrive 15-30 minutes earlier than appointment to allow [place] time  to  obtain all personal demographics,  insurance information and for you to complete office paperwork. - All established patients should arrive 10-15 minutes earlier than appointment time to update all information and be checked in .  - In our best efforts to run on time, if you are late for your appointment you will be asked to either reschedule or if able, we will work you back into the schedule. There will be a wait time to work you back in the schedule,  depending on availability.  - If you are unable to make it to your appointment as scheduled, please call 24 hours ahead of time to allow [place] to fill the time slot with someone else who needs to be seen. If you do not cancel your appointment ahead of time, you may be charged a no show fee.

## 2018-02-17 NOTE — Progress Notes (Signed)
Sarah Fisher , 10-12-1960, 57 y.o., female MRN: [id number] Patient Care Team    Relationship Specialty Notifications Start End  Ma Hillock, DO PCP - General Family Medicine  04/14/16   Daryll Brod, MD Consulting Physician Orthopedic Surgery  06/10/17   Mauri Pole, MD Consulting Physician Gastroenterology  06/10/17   Milinda Pointer, MD Referring Physician Pain Medicine  06/10/17     Chief Complaint  Patient presents with  . Hypertension    Here for follow up, needs refill on Losartan Potassium     Subjective:  Hypertension/hyperlipidemia/overweight:  Pt reports compliance with Losartan 50 mg QD. Blood pressures ranges at home WNL. Patient denies chest pain, shortness of breath, dizziness or lower extremity edema.  Pt is  prescribed statin. BMP:06/10/2017 WNL CBC: 06/10/2018 WNL Lipid: 06/10/2017 T205, HDL68, LDL 107, tg 150 Diet: low sodium Exercise: Not able to do exercises 2/2 chronic pain RF: HLD, HTN, overweight   Menopause: Pt reports she is due for her pap. She also wanted to establish with a gynecologist for her menopausal symptoms of hot flashes and vaginal dryness.   Depression screen [facility] 2/9 02/09/2018 12/16/2017 11/25/2017 09/28/2017 09/09/2017  Decreased Interest 0 0 0 0 0  Down, Depressed, Hopeless 0 0 0 0 0  PHQ - 2 Score 0 0 0 0 0    No Known Allergies Social History   Tobacco Use  . Smoking status: Never Smoker  . Smokeless tobacco: Never Used  Substance Use Topics  . Alcohol use: No    Comment: a glass of wine occasionally   Past Medical History:  Diagnosis Date  . Anemia   . Arthritis   . Cervical spine degeneration 2010   C5-6  . History of carpal tunnel syndrome   . Hyperlipidemia   . Hypertension   . Sacroiliac joint pain 2010   right  . Scoliosis   . Skin cancer of eyelid    Past Surgical History:  Procedure Laterality Date  . BREAST SURGERY  1986   reduction  . cervical facet block  2010  . HAND SURGERY Bilateral 2000  and 2016   benign tumor removal   . I&D EXTREMITY Left 08/10/2017   Procedure: DEBRIDEMENT DISTAL INTERPHALANGEAL JOINT;  Surgeon: Leanora Cover, MD;  Location: [facility];  Service: Orthopedics;  Laterality: Left;  . left finger surgery Left 08/2017  . MASS EXCISION Left 08/10/2017   Procedure: LEFT LONG FINGER EXCISION MASS;  Surgeon: Leanora Cover, MD;  Location: [facility];  Service: Orthopedics;  Laterality: Left;  . WISDOM TOOTH EXTRACTION  1979   Family History  Problem Relation Age of Onset  . Hypertension Mother   . Arthritis Mother   . Hypertension Father   . Liver cancer Father   . Hypertension Sister   . Arthritis Maternal Aunt   . Hypertension Maternal Grandmother   . Kidney cancer Maternal Grandmother   . Alzheimer's disease Maternal Grandfather   . Colon cancer Maternal Grandfather 30  . Diabetes Maternal Grandfather   . Alzheimer's disease Paternal Grandmother   . Esophageal cancer Neg Hx   . Rectal cancer Neg Hx   . Stomach cancer Neg Hx    Allergies as of 02/17/2018   No Known Allergies     Medication List        Accurate as of 02/17/18  2:31 PM. Always use your most recent med list.          ALOE 13244 &  PROBIOTICS Caps Take by mouth.   gabapentin 300 MG capsule Commonly known as:  NEURONTIN 300 mg qday, 600 mg qhs   GLUCOS-CHONDROIT-CA-MG-C-D PO Take by mouth daily.   Green Tea 315 MG Caps Take by mouth daily.   ibuprofen 200 MG tablet Commonly known as:  ADVIL,MOTRIN Take 400 mg by mouth every 4 (four) hours as needed.   losartan 50 MG tablet Commonly known as:  COZAAR TAKE 1 TABLET BY MOUTH DAILY   MULTI-VITAMIN GUMMIES Chew Chew 1 Units by mouth daily.   multivitamin Liqd Take 5 mLs by mouth daily.   Omega 3 1000 MG Caps Take 1 capsule by mouth daily.   pravastatin 10 MG tablet Commonly known as:  PRAVACHOL Take 1 tablet (10 mg total) by mouth daily.   tizanidine 2 MG capsule Commonly known as:   ZANAFLEX Take 1 capsule (2 mg total) by mouth 3 (three) times daily as needed for muscle spasms.   traMADol 50 MG tablet Commonly known as:  ULTRAM Take 1 tablet (50 mg total) by mouth every 6 (six) hours as needed for severe pain.       All past medical history, surgical history, allergies, family history, immunizations andmedications were updated in the EMR today and reviewed under the history and medication portions of their EMR.     ROS: Negative, with the exception of above mentioned in HPI   Objective:  BP 108/75 (BP Location: Right Arm, Patient Position: Sitting, Cuff Size: Small)   Pulse 84   Temp 98.4 F (36.9 C) (Oral)   Resp 16   Ht 5' (1.524 m)   Wt 151 lb (68.5 kg)   LMP  (LMP Unknown) Comment: LMP x 4 years  SpO2 96%   BMI 29.49 kg/m  Body mass index is 29.49 kg/m. Gen: Afebrile. No acute distress. Nontoxic in appearance. Pleasant overweight female.  HENT: AT. [place].  MMM.  Eyes:Pupils Equal Round Reactive to light, Extraocular movements intact,  Conjunctiva without redness, discharge or icterus. Neck/lymp/endocrine: Supple,no lymphadenopathy, no thyromegaly CV: RRR no murmur, no edema, +2/4 P posterior tibialis pulses Chest: CTAB, no wheeze or crackles Abd: Soft. NTND. BS present. no Masses palpated.  Skin: no rashes, purpura or petechiae.  Neuro:  Normal gait. PERLA. EOMi. Alert. Oriented x3  Psych: Normal affect, dress and demeanor. Normal speech. Normal thought content and judgment.  No exam data present No results found. No results found for this or any previous visit (from the past 24 hour(s)).  Assessment/Plan: Sarah Fisher is a 57 y.o. female present for OV for  Essential hypertension/HLD/overweight - Stable.  - refills provided on losartan 50 mg QD.  - labs UTD- due next visit. - low salt diet, exercise.  - f/u 6 months  Menopause/Cervical cancer screening Referral to female gyn placed today per pt request for menopausal symptoms and  routine PAP.  - Ambulatory referral to Gynecology     Reviewed expectations re: course of current medical issues.  Discussed self-management of symptoms.  Outlined signs and symptoms indicating need for more acute intervention.  Patient verbalized understanding and all questions were answered.  Patient received an After-Visit Summary.    No orders of the defined types were placed in this encounter.    Note is dictated utilizing voice recognition software. Although note has been proof read prior to signing, occasional typographical errors still can be missed. If any questions arise, please do not hesitate to call for verification.   electronically signed by:  Joseph Art  Raoul Pitch, DO  [facility] [place]

## 2018-02-24 ENCOUNTER — Encounter: Payer: BLUE CROSS/BLUE SHIELD | Admitting: Nurse Practitioner

## 2018-03-12 DIAGNOSIS — L980 Pyogenic granuloma: Secondary | ICD-10-CM | POA: Diagnosis not present

## 2018-03-16 ENCOUNTER — Other Ambulatory Visit: Payer: Self-pay

## 2018-03-16 ENCOUNTER — Ambulatory Visit
Payer: BLUE CROSS/BLUE SHIELD | Attending: Student in an Organized Health Care Education/Training Program | Admitting: Student in an Organized Health Care Education/Training Program

## 2018-03-16 ENCOUNTER — Encounter: Payer: Self-pay | Admitting: Student in an Organized Health Care Education/Training Program

## 2018-03-16 VITALS — BP 128/99 | HR 61 | Temp 98.5°F | Resp 16 | Ht 60.0 in | Wt 151.0 lb

## 2018-03-16 DIAGNOSIS — M069 Rheumatoid arthritis, unspecified: Secondary | ICD-10-CM | POA: Diagnosis not present

## 2018-03-16 DIAGNOSIS — M4722 Other spondylosis with radiculopathy, cervical region: Secondary | ICD-10-CM | POA: Diagnosis not present

## 2018-03-16 DIAGNOSIS — Z6829 Body mass index (BMI) 29.0-29.9, adult: Secondary | ICD-10-CM | POA: Diagnosis not present

## 2018-03-16 DIAGNOSIS — M25519 Pain in unspecified shoulder: Secondary | ICD-10-CM | POA: Insufficient documentation

## 2018-03-16 DIAGNOSIS — G8929 Other chronic pain: Secondary | ICD-10-CM

## 2018-03-16 DIAGNOSIS — D649 Anemia, unspecified: Secondary | ICD-10-CM | POA: Diagnosis not present

## 2018-03-16 DIAGNOSIS — I10 Essential (primary) hypertension: Secondary | ICD-10-CM | POA: Diagnosis not present

## 2018-03-16 DIAGNOSIS — M7918 Myalgia, other site: Secondary | ICD-10-CM | POA: Insufficient documentation

## 2018-03-16 DIAGNOSIS — K5904 Chronic idiopathic constipation: Secondary | ICD-10-CM | POA: Insufficient documentation

## 2018-03-16 DIAGNOSIS — H8112 Benign paroxysmal vertigo, left ear: Secondary | ICD-10-CM | POA: Diagnosis not present

## 2018-03-16 DIAGNOSIS — E559 Vitamin D deficiency, unspecified: Secondary | ICD-10-CM | POA: Diagnosis not present

## 2018-03-16 DIAGNOSIS — M542 Cervicalgia: Secondary | ICD-10-CM | POA: Diagnosis not present

## 2018-03-16 DIAGNOSIS — M47812 Spondylosis without myelopathy or radiculopathy, cervical region: Secondary | ICD-10-CM | POA: Diagnosis not present

## 2018-03-16 DIAGNOSIS — Z79891 Long term (current) use of opiate analgesic: Secondary | ICD-10-CM | POA: Insufficient documentation

## 2018-03-16 DIAGNOSIS — M5412 Radiculopathy, cervical region: Secondary | ICD-10-CM | POA: Diagnosis not present

## 2018-03-16 DIAGNOSIS — M170 Bilateral primary osteoarthritis of knee: Secondary | ICD-10-CM | POA: Insufficient documentation

## 2018-03-16 DIAGNOSIS — R51 Headache: Secondary | ICD-10-CM | POA: Diagnosis not present

## 2018-03-16 DIAGNOSIS — E663 Overweight: Secondary | ICD-10-CM | POA: Insufficient documentation

## 2018-03-16 DIAGNOSIS — E785 Hyperlipidemia, unspecified: Secondary | ICD-10-CM | POA: Insufficient documentation

## 2018-03-16 DIAGNOSIS — M50322 Other cervical disc degeneration at C5-C6 level: Secondary | ICD-10-CM | POA: Insufficient documentation

## 2018-03-16 DIAGNOSIS — Z79899 Other long term (current) drug therapy: Secondary | ICD-10-CM | POA: Insufficient documentation

## 2018-03-16 DIAGNOSIS — D126 Benign neoplasm of colon, unspecified: Secondary | ICD-10-CM | POA: Diagnosis not present

## 2018-03-16 DIAGNOSIS — G894 Chronic pain syndrome: Secondary | ICD-10-CM | POA: Diagnosis not present

## 2018-03-16 DIAGNOSIS — M4802 Spinal stenosis, cervical region: Secondary | ICD-10-CM | POA: Insufficient documentation

## 2018-03-16 NOTE — Progress Notes (Signed)
Patient's Name: Sarah Fisher  MRN: [id number]  Referring Provider: Ma Hillock, DO  DOB: 1961-05-14  PCP: Ma Hillock, DO  DOS: 03/16/2018  Note by: Gillis Santa, MD  Service setting: Ambulatory outpatient  Specialty: Interventional Pain Management  Location: [facility] (AMB) Pain Management Facility    Patient type: Established   Primary Reason(s) for Visit: Encounter for prescription drug management. (Level of risk: moderate)  CC: Neck Pain  HPI  [name] is a 57 y.o. year old, female patient, who comes today for a medication management evaluation. She has Hypertension; Hyperlipidemia; Overweight; Vitamin D deficiency; Benign paroxysmal positional vertigo of left ear; Adenomatous polyp of colon; Chronic pain syndrome; Osteoarthritis of cervical spine; Cervical DDD (degenerative disc disease) (C5-6); Cervical central spinal stenosis C5-6 (Right); Chronic neck pain (Location of Primary Source of Pain) (Bilateral) (L>R); Cervicogenic headache (Bilateral) (R>L); Occipital headache (Bilateral) (R>L); Neurogenic pain; Myofascial pain; Chronic shoulder pain (Location of Tertiary source of pain) (Bilateral) (L>R); Chronic upper back pain (Location of Secondary source of pain) (Bilateral) (L>R); Chronic low back pain (Bilateral) (L>R); Cervical spondylosis; Numbness of upper extremity (Bilateral) (L>R); Chronic cervical radiculopathy (Bilateral) (L>R); Chronic knee pain (Bilateral) (R>L); Osteoarthritis of knee (Bilateral) (R>L); Rheumatoid arthritis, seronegative, shoulder region (Bilateral) (L>R); Chronic idiopathic constipation; and Long term (current) use of opiate analgesic on their problem list. Her primarily concern today is the Neck Pain  Pain Assessment: Location: Left, Right(left side is worse) Neck Radiating: stiffness travels to shoulders and upper arms, bilaterally, resulting in "tired arms" Onset: More than a month ago Duration: Chronic pain Quality: Constant, Tightness Severity: 5 /10  (subjective, self-reported pain score)  Note: Reported level is compatible with observation.                         When using our objective Pain Scale, levels between 6 and 10/10 are said to belong in an emergency room, as it progressively worsens from a 6/10, described as severely limiting, requiring emergency care not usually available at an outpatient pain management facility. At a 6/10 level, communication becomes difficult and requires great effort. Assistance to reach the emergency department may be required. Facial flushing and profuse sweating along with potentially dangerous increases in heart rate and blood pressure will be evident. Effect on ADL:   Timing: Constant Modifying factors: heat, medications, has begun accupuncture therapy BP: (!) 128/99  HR: 61  [name] was last scheduled for an appointment on 02/09/2018 for medication management. During today's appointment we reviewed [name]'s chronic pain status, as well as her outpatient medication regimen.  Patient follows up today status post steroid taper.  States that steroid taper was not very effective for her left neck pain.  Patient has also started acupuncture and has had 3 sessions.  She notes mild improvement from her Accu puncture therapy.  Patient is complaining of tightness and spasms along her left cervical paraspinal muscles, left trapezius and left superior deltoid.  This could be consistent with cervical myofascial pain syndrome.  We discussed cervical and thoracic trigger point injections for cervical thoracic myofascial pain syndrome.  Risks and benefits were discussed and patient would like to proceed.  The patient  reports that she does not use drugs. Her body mass index is 29.49 kg/m.  Further details on both, my assessment(s), as well as the proposed treatment plan, please see below.   Laboratory Chemistry  Inflammation Markers (CRP: Acute Phase) (ESR: Chronic Phase) Lab Results  Component  Value Date    CRP <0.8 10/01/2016   ESRSEDRATE 21 10/01/2016                         Rheumatology Markers Lab Results  Component Value Date   RF <10.0 10/01/2016   ANA Negative 10/01/2016                        Renal Function Markers Lab Results  Component Value Date   BUN 18 06/10/2017   CREATININE 0.67 06/10/2017   GFRAA >60 10/01/2016   GFRNONAA >60 10/01/2016                             Hepatic Function Markers Lab Results  Component Value Date   AST 17 06/10/2017   ALT 16 06/10/2017   ALBUMIN 4.7 06/10/2017   ALKPHOS 63 06/10/2017   HCVAB NEGATIVE 05/15/2016                        Electrolytes Lab Results  Component Value Date   NA 142 06/10/2017   K 4.2 06/10/2017   CL 103 06/10/2017   CALCIUM 9.6 06/10/2017   MG 2.2 10/01/2016                        Neuropathy Markers Lab Results  Component Value Date   FRTMYTRZ73 567 10/01/2016   HGBA1C 5.4 06/10/2017   HIV NONREACTIVE 05/15/2016                        CNS Tests No results found for: SDES, GRAMSTAIN, CULT, COLORCSF, APPEARCSF, RBCCOUNTCSF, WBCCSF, POLYSCSF, LYMPHSCSF, EOSCSF, PROTEINCSF, GLUCCSF, JCVIRUS, CSFOLI, IGGCSF, IGGALB, IGGIND                      Bone Pathology Markers Lab Results  Component Value Date   VD25OH 27.56 (L) 06/10/2017   25OHVITD1 40 10/01/2016   25OHVITD2 18 10/01/2016   25OHVITD3 22 10/01/2016                         Coagulation Parameters Lab Results  Component Value Date   PLT 322.0 06/10/2017                        Cardiovascular Markers Lab Results  Component Value Date   HGB 12.7 06/10/2017   HCT 38.8 06/10/2017                         CA Markers No results found for: CEA, CA125, LABCA2                      Note: Lab results reviewed.  Recent Diagnostic Imaging Results  DG C-Arm 1-60 Min-No Report Fluoroscopy was utilized by the requesting physician.  No radiographic  interpretation.   Complexity Note: Imaging results reviewed. Results shared with Ms.  Ciampi, using Layman's terms.                         Meds   Current Outpatient Medications:  .  gabapentin (NEURONTIN) 300 MG capsule, 300 mg qday, 600 mg qhs, Disp: 90 capsule, Rfl: 2 .  GLUCOS-CHONDROIT-CA-MG-C-D PO, Take by mouth daily., Disp: ,  Rfl:  .  Green Tea 315 MG CAPS, Take by mouth daily., Disp: , Rfl:  .  ibuprofen (ADVIL,MOTRIN) 200 MG tablet, Take 400 mg by mouth every 4 (four) hours as needed. , Disp: , Rfl:  .  losartan (COZAAR) 50 MG tablet, Take 1 tablet (50 mg total) by mouth daily., Disp: 90 tablet, Rfl: 1 .  Multiple Vitamin (MULTIVITAMIN) LIQD, Take 5 mLs by mouth daily., Disp: , Rfl:  .  Multiple Vitamins-Minerals (MULTI-VITAMIN GUMMIES) CHEW, Chew 1 Units by mouth daily., Disp: , Rfl:  .  Omega 3 1000 MG CAPS, Take 1 capsule by mouth daily., Disp: , Rfl:  .  pravastatin (PRAVACHOL) 10 MG tablet, Take 1 tablet (10 mg total) by mouth daily., Disp: 90 tablet, Rfl: 3 .  Probiotic Product (ALOE 99833 & PROBIOTICS) CAPS, Take by mouth., Disp: , Rfl:  .  Specialty Vitamins Products (MENOPAUSE RELIEF PO), Take 2 tablets by mouth daily., Disp: , Rfl:  .  tizanidine (ZANAFLEX) 2 MG capsule, Take 1 capsule (2 mg total) by mouth 3 (three) times daily as needed for muscle spasms., Disp: 90 capsule, Rfl: 5  ROS  Constitutional: Denies any fever or chills Gastrointestinal: No reported hemesis, hematochezia, vomiting, or acute GI distress Musculoskeletal: Denies any acute onset joint swelling, redness, loss of ROM, or weakness Neurological: No reported episodes of acute onset apraxia, aphasia, dysarthria, agnosia, amnesia, paralysis, loss of coordination, or loss of consciousness  Allergies  Ms. Sane has No Known Allergies.  [place]  Drug: Ms. Simerly  reports that she does not use drugs. Alcohol:  reports that she does not drink alcohol. Tobacco:  reports that she has never smoked. She has never used smokeless tobacco. Medical:  has a past medical history of Anemia,  Arthritis, Cervical spine degeneration (2010), History of carpal tunnel syndrome, Hyperlipidemia, Hypertension, Sacroiliac joint pain (2010), Scoliosis, and Skin cancer of eyelid. Surgical: Ms. Funaro  has a past surgical history that includes Breast surgery (1986); Hand surgery (Bilateral, 2000 and 2016); cervical facet block (2010); Wisdom tooth extraction (1979); Mass excision (Left, 08/10/2017); I&D extremity (Left, 08/10/2017); and left finger surgery (Left, 08/2017). Family: family history includes Alzheimer's disease in her maternal grandfather and paternal grandmother; Arthritis in her maternal aunt and mother; Colon cancer (age of onset: 30) in her maternal grandfather; Diabetes in her maternal grandfather; Hypertension in her father, maternal grandmother, mother, and sister; Kidney cancer in her maternal grandmother; Liver cancer in her father.  Constitutional Exam  General appearance: Well nourished, well developed, and well hydrated. In no apparent acute distress Vitals:   03/16/18 1351  BP: (!) 128/99  Pulse: 61  Resp: 16  Temp: 98.5 F (36.9 C)  TempSrc: Oral  SpO2: 99%  Weight: 151 lb (68.5 kg)  Height: 5' (1.524 m)   BMI Assessment: Estimated body mass index is 29.49 kg/m as calculated from the following:   Height as of this encounter: 5' (1.524 m).   Weight as of this encounter: 151 lb (68.5 kg).  BMI interpretation table: BMI level Category Range association with higher incidence of chronic pain  <18 kg/m2 Underweight   18.5-24.9 kg/m2 Ideal body weight   25-29.9 kg/m2 Overweight Increased incidence by 20%  30-34.9 kg/m2 Obese (Class I) Increased incidence by 68%  35-39.9 kg/m2 Severe obesity (Class II) Increased incidence by 136%  >40 kg/m2 Extreme obesity (Class III) Increased incidence by 254%   Patient's current BMI Ideal Body weight  Body mass index is 29.49 kg/m. Ideal body weight:  45.5 kg (100 lb 4.9 oz) Adjusted ideal body weight: 54.7 kg (120 lb 9.4 oz)    BMI Readings from Last 4 Encounters:  03/16/18 29.49 kg/m  02/17/18 29.49 kg/m  02/09/18 29.69 kg/m  12/16/17 29.10 kg/m   Wt Readings from Last 4 Encounters:  03/16/18 151 lb (68.5 kg)  02/17/18 151 lb (68.5 kg)  02/09/18 152 lb (68.9 kg)  12/16/17 149 lb (67.6 kg)  Psych/Mental status: Alert, oriented x 3 (person, place, & time)       Eyes: PERLA Respiratory: No evidence of acute respiratory distress  Cervical Spine Area Exam  Skin & Axial Inspection: No masses, redness, edema, swelling, or associated skin lesions Alignment: Symmetrical Functional ROM: Decreased ROM      Stability: No instability detected Muscle Tone/Strength: Functionally intact. No obvious neuro-muscular anomalies detected. Sensory (Neurological): Musculoskeletal pain pattern Palpation: Complains of area being tender to palpation Positive provocative maneuver for for cervical facet disease left greater than right   Upper Extremity (UE) Exam    Side: Right upper extremity  Side: Left upper extremity  Skin & Extremity Inspection: Skin color, temperature, and hair growth are WNL. No peripheral edema or cyanosis. No masses, redness, swelling, asymmetry, or associated skin lesions. No contractures.  Skin & Extremity Inspection: Skin color, temperature, and hair growth are WNL. No peripheral edema or cyanosis. No masses, redness, swelling, asymmetry, or associated skin lesions. No contractures.  Functional ROM: Unrestricted ROM          Functional ROM: Unrestricted ROM          Muscle Tone/Strength: Functionally intact. No obvious neuro-muscular anomalies detected.  Muscle Tone/Strength: Functionally intact. No obvious neuro-muscular anomalies detected.  Sensory (Neurological): Unimpaired          Sensory (Neurological): Unimpaired          Palpation: No palpable anomalies              Palpation: No palpable anomalies              Provocative Test(s):  Phalen's test: deferred Tinel's test: deferred Apley's  scratch test (touch opposite shoulder):  Action 1 (Across chest): deferred Action 2 (Overhead): deferred Action 3 (LB reach): deferred   Provocative Test(s):  Phalen's test: deferred Tinel's test: deferred Apley's scratch test (touch opposite shoulder):  Action 1 (Across chest): deferred Action 2 (Overhead): deferred Action 3 (LB reach): deferred    Thoracic Spine Area Exam  Skin & Axial Inspection: No masses, redness, or swelling Alignment: Symmetrical Functional ROM: Unrestricted ROM Stability: No instability detected Muscle Tone/Strength: Functionally intact. No obvious neuro-muscular anomalies detected. Sensory (Neurological): Unimpaired Muscle strength & Tone: No palpable anomalies  Lumbar Spine Area Exam  Skin & Axial Inspection: No masses, redness, or swelling Alignment: Symmetrical Functional ROM: Unrestricted ROM       Stability: No instability detected Muscle Tone/Strength: Functionally intact. No obvious neuro-muscular anomalies detected. Sensory (Neurological): Unimpaired Palpation: No palpable anomalies       Provocative Tests: Hyperextension/rotation test: deferred today       Lumbar quadrant test (Kemp's test): deferred today       Lateral bending test: deferred today       Patrick's Maneuver: deferred today                   FABER test: deferred today                   S-I anterior distraction/compression test: deferred today  S-I lateral compression test: deferred today         S-I Thigh-thrust test: deferred today         S-I Gaenslen's test: deferred today          Gait & Posture Assessment  Ambulation: Unassisted Gait: Relatively normal for age and body habitus Posture: WNL   Lower Extremity Exam    Side: Right lower extremity  Side: Left lower extremity  Stability: No instability observed          Stability: No instability observed          Skin & Extremity Inspection: Skin color, temperature, and hair growth are WNL. No peripheral edema or  cyanosis. No masses, redness, swelling, asymmetry, or associated skin lesions. No contractures.  Skin & Extremity Inspection: Skin color, temperature, and hair growth are WNL. No peripheral edema or cyanosis. No masses, redness, swelling, asymmetry, or associated skin lesions. No contractures.  Functional ROM: Unrestricted ROM                  Functional ROM: Unrestricted ROM                  Muscle Tone/Strength: Functionally intact. No obvious neuro-muscular anomalies detected.  Muscle Tone/Strength: Functionally intact. No obvious neuro-muscular anomalies detected.  Sensory (Neurological): Unimpaired  Sensory (Neurological): Unimpaired  Palpation: No palpable anomalies  Palpation: No palpable anomalies   Assessment  Primary Diagnosis & Pertinent Problem List: The primary encounter diagnosis was Cervical myofascial pain syndrome. Diagnoses of Spondylosis of cervical region without myelopathy or radiculopathy, Cervical facet syndrome, Chronic neck pain (Location of Primary Source of Pain) (Bilateral) (L>R), Chronic cervical radiculopathy (Bilateral) (L>R), Cervical central spinal stenosis C5-6 (Right), and Chronic pain syndrome were also pertinent to this visit.  Status Diagnosis  Persistent Persistent Persistent 1. Cervical myofascial pain syndrome   2. Spondylosis of cervical region without myelopathy or radiculopathy   3. Cervical facet syndrome   4. Chronic neck pain (Location of Primary Source of Pain) (Bilateral) (L>R)   5. Chronic cervical radiculopathy (Bilateral) (L>R)   6. Cervical central spinal stenosis C5-6 (Right)   7. Chronic pain syndrome     General Recommendations: The pain condition that the patient suffers from is best treated with a multidisciplinary approach that involves an increase in physical activity to prevent de-conditioning and worsening of the pain cycle, as well as psychological counseling (formal and/or informal) to address the co-morbid psychological affects  of pain. Treatment will often involve judicious use of pain medications and interventional procedures to decrease the pain, allowing the patient to participate in the physical activity that will ultimately produce long-lasting pain reductions. The goal of the multidisciplinary approach is to return the patient to a higher level of overall function and to restore their ability to perform activities of daily living.  57 year old female with a history of left-sided axial neck pain that radiates to her periscapular and shoulder region secondary to cervical spondylosis and facet arthropathy.  Patient also has a history of left cervical radiculopathy which is currently under satisfactory control after left C7-T1 epidural steroid injection which is still helping the patient with her left upper extremity radicular symptoms.  Patient's left neck and shoulder pain symptoms seem musculoskeletal and myofascial. Patient has tried acupuncture on 3 occasions with mild benefit.  Patient was in [place] approximately 2 weeks ago was helping her son move and feels that may have exacerbated her neck pain.  Patient does have palpable trigger points along her  left cervical paraspinal, trapezius, deltoids.  This could be related to cervical myofascial pain syndrome.  We discussed cervical and trapezius trigger point injections for myofascial pain syndrome.  Risks and benefits were discussed.  Patient would like to proceed.  Plan: Lab-work, procedure(s), and/or referral(s): Orders Placed This Encounter  Procedures  . TRIGGER POINT INJECTION    Time Note: Greater than 50% of the 15 minute(s) of face-to-face time spent with Ms. Harting, was spent in counseling/coordination of care regarding: Ms. Colson primary cause of pain, the treatment plan, treatment alternatives and the risks and possible complications of proposed treatment.  Provider-requested follow-up: Return in about 2 weeks (around 03/30/2018) for Procedure.  Future  Appointments  Date Time Provider [place]  03/31/2018  1:30 PM Gillis Santa, MD [facility] None  06/15/2018  9:30 AM Ma Hillock, DO [facility]    Primary Care Physician: Ma Hillock, DO Location: [facility] Note by: Gillis Santa, M.D Date: 03/16/2018; Time: 2:40 PM  There are no Patient Instructions on file for this visit.

## 2018-03-16 NOTE — Progress Notes (Signed)
Safety precautions to be maintained throughout the outpatient stay will include: orient to surroundings, keep bed in low position, maintain call bell within reach at all times, provide assistance with transfer out of bed and ambulation.  

## 2018-03-17 ENCOUNTER — Other Ambulatory Visit: Payer: Self-pay | Admitting: Student in an Organized Health Care Education/Training Program

## 2018-03-25 ENCOUNTER — Telehealth: Payer: Self-pay

## 2018-03-25 MED ORDER — GABAPENTIN 300 MG PO CAPS: ORAL_CAPSULE | ORAL | 2 refills | Status: DC

## 2018-03-25 NOTE — Telephone Encounter (Signed)
Pt called and need's new RX sent to Pharmacy for Gabapentin

## 2018-03-25 NOTE — Telephone Encounter (Signed)
Sent to Dr Lateef 

## 2018-03-25 NOTE — Telephone Encounter (Signed)
Patient notified per voicemail that script was sent to pharmacy.

## 2018-03-31 ENCOUNTER — Ambulatory Visit: Payer: BLUE CROSS/BLUE SHIELD | Admitting: Student in an Organized Health Care Education/Training Program

## 2018-04-05 ENCOUNTER — Other Ambulatory Visit: Payer: Self-pay

## 2018-04-05 ENCOUNTER — Ambulatory Visit
Payer: BLUE CROSS/BLUE SHIELD | Attending: Student in an Organized Health Care Education/Training Program | Admitting: Student in an Organized Health Care Education/Training Program

## 2018-04-05 ENCOUNTER — Encounter: Payer: Self-pay | Admitting: Student in an Organized Health Care Education/Training Program

## 2018-04-05 VITALS — BP 153/102 | HR 75 | Temp 98.4°F | Resp 16 | Ht 60.0 in | Wt 150.0 lb

## 2018-04-05 DIAGNOSIS — M7918 Myalgia, other site: Secondary | ICD-10-CM | POA: Insufficient documentation

## 2018-04-05 MED ORDER — LIDOCAINE HCL 2 % IJ SOLN
INTRAMUSCULAR | Status: AC
  Filled 2018-04-05: qty 20

## 2018-04-05 MED ORDER — ROPIVACAINE HCL 2 MG/ML IJ SOLN
INTRAMUSCULAR | Status: AC
  Filled 2018-04-05: qty 10

## 2018-04-05 MED ORDER — ROPIVACAINE HCL 2 MG/ML IJ SOLN
10.0000 mL | Freq: Once | INTRAMUSCULAR | Status: AC
  Administered 2018-04-05: 10 mL

## 2018-04-05 MED ORDER — DEXAMETHASONE SODIUM PHOSPHATE 10 MG/ML IJ SOLN
INTRAMUSCULAR | Status: AC
  Filled 2018-04-05: qty 1

## 2018-04-05 NOTE — Progress Notes (Signed)
Patient's Name: Sarah Fisher  MRN: [id number]  Referring Provider: Ma Hillock, DO  DOB: 1961/06/12  PCP: Ma Hillock, DO  DOS: 04/05/2018  Note by: Gillis Santa, MD  Service setting: Ambulatory outpatient  Specialty: Interventional Pain Management  Patient type: Established  Location: [facility] (AMB) Pain Management Facility  Visit type: Interventional Procedure   Primary Reason for Visit: Interventional Pain Management Treatment. CC: No chief complaint on file.  Procedure:          Anesthesia, Analgesia, Anxiolysis:  Type: Trigger Point Injection (3+ muscle groups)          CPT: 20553 Primary Purpose: Diagnostic Region:       Cervical Level: Cervical Target Area: Trigger Point Approach: Percutaneous, ipsilateral approach. Laterality: Midline L>R          IV Access: Declined Sedation: Declined    Position: Sitting   Indications: 1. Cervical myofascial pain syndrome   2. Myofascial pain    Pain Score: Pre-procedure: 4 /10 Post-procedure: 4 /10  Pre-op Assessment:  Sarah Fisher is a 57 y.o. (year old), female patient, seen today for interventional treatment. She  has a past surgical history that includes Breast surgery (1986); Hand surgery (Bilateral, 2000 and 2016); cervical facet block (2010); Wisdom tooth extraction (1979); Mass excision (Left, 08/10/2017); I&D extremity (Left, 08/10/2017); and left finger surgery (Left, 08/2017). Sarah Fisher has a current medication list which includes the following prescription(s): gabapentin, glucos-chondroit-ca-mg-c-d, green tea, ibuprofen, losartan, multivitamin, multi-vitamin gummies, NON FORMULARY, omega 3, pravastatin, aloe 10000 & probiotics, specialty vitamins products, and tizanidine, and the following Facility-Administered Medications: ropivacaine (pf) 2 mg/ml (0.2%). Her primarily concern today is the No chief complaint on file.  Initial Vital Signs:  Pulse/HCG Rate: 85  Temp: 98.4 F (36.9 C) Resp: 16 BP: 127/85 SpO2: 99  %  BMI: Estimated body mass index is 29.29 kg/m as calculated from the following:   Height as of this encounter: 5' (1.524 m).   Weight as of this encounter: 150 lb (68 kg).  Risk Assessment: Allergies: Reviewed. She has No Known Allergies.  Allergy Precautions: None required Coagulopathies: Reviewed. None identified.  Blood-thinner therapy: None at this time Active Infection(s): Reviewed. None identified. Sarah Fisher is afebrile  Site Confirmation: Sarah Fisher was asked to confirm the procedure and laterality before marking the site Procedure checklist: Completed Consent: Before the procedure and under the influence of no sedative(s), amnesic(s), or anxiolytics, the patient was informed of the treatment options, risks and possible complications. To fulfill our ethical and legal obligations, as recommended by the American Medical Association's Code of Ethics, I have informed the patient of my clinical impression; the nature and purpose of the treatment or procedure; the risks, benefits, and possible complications of the intervention; the alternatives, including doing nothing; the risk(s) and benefit(s) of the alternative treatment(s) or procedure(s); and the risk(s) and benefit(s) of doing nothing. The patient was provided information about the general risks and possible complications associated with the procedure. These may include, but are not limited to: failure to achieve desired goals, infection, bleeding, organ or nerve damage, allergic reactions, paralysis, and death. In addition, the patient was informed of those risks and complications associated to the procedure, such as failure to decrease pain; infection; bleeding; organ or nerve damage with subsequent damage to sensory, motor, and/or autonomic systems, resulting in permanent pain, numbness, and/or weakness of one or several areas of the body; allergic reactions; (i.e.: anaphylactic reaction); and/or death. Furthermore, the patient was  informed of  those risks and complications associated with the medications. These include, but are not limited to: allergic reactions (i.e.: anaphylactic or anaphylactoid reaction(s)); adrenal axis suppression; blood sugar elevation that in diabetics may result in ketoacidosis or comma; water retention that in patients with history of congestive heart failure may result in shortness of breath, pulmonary edema, and decompensation with resultant heart failure; weight gain; swelling or edema; medication-induced neural toxicity; particulate matter embolism and blood vessel occlusion with resultant organ, and/or nervous system infarction; and/or aseptic necrosis of one or more joints. Finally, the patient was informed that Medicine is not an exact science; therefore, there is also the possibility of unforeseen or unpredictable risks and/or possible complications that may result in a catastrophic outcome. The patient indicated having understood very clearly. We have given the patient no guarantees and we have made no promises. Enough time was given to the patient to ask questions, all of which were answered to the patient's satisfaction. Sarah Fisher has indicated that she wanted to continue with the procedure. Attestation: I, the ordering provider, attest that I have discussed with the patient the benefits, risks, side-effects, alternatives, likelihood of achieving goals, and potential problems during recovery for the procedure that I have provided informed consent. Date  Time: 04/05/2018 12:46 PM  Pre-Procedure Preparation:  Monitoring: As per clinic protocol. Respiration, ETCO2, SpO2, BP, heart rate and rhythm monitor placed and checked for adequate function Safety Precautions: Patient was assessed for positional comfort and pressure points before starting the procedure. Time-out: I initiated and conducted the "Time-out" before starting the procedure, as per protocol. The patient was asked to participate by  confirming the accuracy of the "Time Out" information. Verification of the correct person, site, and procedure were performed and confirmed by me, the nursing staff, and the patient. "Time-out" conducted as per Joint Commission's Universal Protocol (UP.01.01.01). Time: 1314  Description of Procedure:          Area Prepped: Entire Upper Cervical Region Prepping solution: ChloraPrep (2% chlorhexidine gluconate and 70% isopropyl alcohol) Safety Precautions: Aspiration looking for blood return was conducted prior to all injections. At no point did we inject any substances, as a needle was being advanced. No attempts were made at seeking any paresthesias. Safe injection practices and needle disposal techniques used. Medications properly checked for expiration dates. SDV (single dose vial) medications used. Description of the Procedure: Protocol guidelines were followed. The patient was placed in position over the fluoroscopy table. The target area was identified and the area prepped in the usual manner. Skin & deeper tissues infiltrated with local anesthetic. Appropriate amount of time allowed to pass for local anesthetics to take effect. The procedure needles were then advanced to the target area. Proper needle placement secured. Negative aspiration confirmed. Solution injected in intermittent fashion, asking for systemic symptoms every 0.5cc of injectate. The needles were then removed and the area cleansed, making sure to leave some of the prepping solution back to take advantage of its long term bactericidal properties.  Vitals:   04/05/18 1254  BP: 127/85  Pulse: 85  Resp: 16  Temp: 98.4 F (36.9 C)  SpO2: 99%  Weight: 150 lb (68 kg)  Height: 5' (1.524 m)    Start Time: 1315 hrs. End Time: 1325 hrs. Materials:  Needle(s) Type: Regular needle Gauge: 25G Length: 1.5-in Medication(s): Please see orders for medications and dosing details. Approximately 10 trigger point injections performed in  the cervical thoracic region specifically focusing on cervical paraspinals, splenius, trapezius.  Each trigger point  injected with 1 cc of 0.2% ropivacaine with dry needling performed. Imaging Guidance:          Type of Imaging Technique: None used Indication(s): N/A Exposure Time: No patient exposure Contrast: None used. Fluoroscopic Guidance: N/A Ultrasound Guidance: N/A Interpretation: N/A  Antibiotic Prophylaxis:   Anti-infectives (From admission, onward)   None     Indication(s): None identified  Post-operative Assessment:  Post-procedure Vital Signs:  Pulse/HCG Rate: 85  Temp: 98.4 F (36.9 C) Resp: 16 BP: 127/85 SpO2: 99 %  EBL: None  Complications: No immediate post-treatment complications observed by team, or reported by patient.  Note: The patient tolerated the entire procedure well. A repeat set of vitals were taken after the procedure and the patient was kept under observation following institutional policy, for this type of procedure. Post-procedural neurological assessment was performed, showing return to baseline, prior to discharge. The patient was provided with post-procedure discharge instructions, including a section on how to identify potential problems. Should any problems arise concerning this procedure, the patient was given instructions to immediately contact us, at any time, without hesitation. In any case, we plan to contact the patient by telephone for a follow-up status report regarding this interventional procedure.  Comments:  No additional relevant information.  Plan of Care   Imaging Orders  No imaging studies ordered today   Procedure Orders    No procedure(s) ordered today    Medications ordered for procedure: Meds ordered this encounter  Medications  . ropivacaine (PF) 2 mg/mL (0.2%) (NAROPIN) injection 10 mL   Medications administered: [name] had no medications administered during this visit.  See the medical record for  exact dosing, route, and time of administration.  New Prescriptions   No medications on file   Disposition: Discharge home  Discharge Date & Time: 04/05/2018;   hrs.   Physician-requested Follow-up: Return if symptoms worsen or fail to improve.  Future Appointments  Date Time Provider [facility]  06/15/2018  9:30 AM Ma Hillock DO [facility]   Primary Care Physician: Ma Hillock, DO Location: [facility] Note by: Gillis Santa, MD Date: 04/05/2018; Time: 1:32 PM  Disclaimer:  Medicine is not an exact science. The only guarantee in medicine is that nothing is guaranteed. It is important to note that the decision to proceed with this intervention was based on the information collected from the patient. The Data and conclusions were drawn from the patient's questionnaire, the interview, and the physical examination. Because the information was provided in large part by the patient, it cannot be guaranteed that it has not been purposely or unconsciously manipulated. Every effort has been made to obtain as much relevant data as possible for this evaluation. It is important to note that the conclusions that lead to this procedure are derived in large part from the available data. Always take into account that the treatment will also be dependent on availability of resources and existing treatment guidelines, considered by other Pain Management Practitioners as being common knowledge and practice, at the time of the intervention. For Medico-Legal purposes, it is also important to point out that variation in procedural techniques and pharmacological choices are the acceptable norm. The indications, contraindications, technique, and results of the above procedure should only be interpreted and judged by a Board-Certified Interventional Pain Specialist with extensive familiarity and expertise in the same exact procedure and technique.

## 2018-04-05 NOTE — Progress Notes (Signed)
Safety precautions to be maintained throughout the outpatient stay will include: orient to surroundings, keep bed in low position, maintain call bell within reach at all times, provide assistance with transfer out of bed and ambulation.  

## 2018-04-06 ENCOUNTER — Telehealth: Payer: Self-pay | Admitting: *Deleted

## 2018-04-06 NOTE — Telephone Encounter (Signed)
No problems post procedure. 

## 2018-04-14 ENCOUNTER — Ambulatory Visit: Payer: BLUE CROSS/BLUE SHIELD

## 2018-04-30 ENCOUNTER — Telehealth: Payer: Self-pay | Admitting: Family Medicine

## 2018-04-30 NOTE — Telephone Encounter (Signed)
Copied from [place] [id number]. Topic: Quick Communication - See Telephone Encounter >> Apr 30, 2018  3:25 PM Blase Mess A wrote: CRM for notification. See Telephone encounter for: 04/30/18. Patient is calling because she is in need of her immunization record please advise .  She is seek new employment and she is not sure if she has had an MMR, chicken pox, tetus, flu shot. [phone]

## 2018-04-30 NOTE — Telephone Encounter (Signed)
Spoke with patient she needs MMR titer and Varicella titers for employment screen. Scheduled patient for appt with Dr Raoul Pitch.

## 2018-05-03 ENCOUNTER — Ambulatory Visit: Payer: BLUE CROSS/BLUE SHIELD | Admitting: Family Medicine

## 2018-05-03 ENCOUNTER — Encounter: Payer: Self-pay | Admitting: Family Medicine

## 2018-05-03 VITALS — BP 111/78 | HR 84 | Resp 16 | Ht 60.0 in | Wt 152.0 lb

## 2018-05-03 DIAGNOSIS — Z0184 Encounter for antibody response examination: Secondary | ICD-10-CM | POA: Diagnosis not present

## 2018-05-03 DIAGNOSIS — M06019 Rheumatoid arthritis without rheumatoid factor, unspecified shoulder: Secondary | ICD-10-CM

## 2018-05-03 NOTE — Patient Instructions (Addendum)
If you do not have immunity to either chicken pox or MMR we will schedule you for immunizations or you can get at the health department. Check on your Tetanus vaccine, if you can not find record of it, we can get you in by nurse visit to get it (same as your TB testing)  You do not need a referral for GYN you can call and schedule.   I have referred you to rheumatology as requested for your arthritis.

## 2018-05-03 NOTE — Progress Notes (Signed)
Sarah Fisher , 1960/08/13, 57 y.o., female MRN: [id number] Patient Care Team    Relationship Specialty Notifications Start End  Ma Hillock, DO PCP - General Family Medicine  04/14/16   Daryll Brod, MD Consulting Physician Orthopedic Surgery  06/10/17   Mauri Pole, MD Consulting Physician Gastroenterology  06/10/17   Milinda Pointer, MD Referring Physician Pain Medicine  06/10/17     Chief Complaint  Patient presents with  . vaccination titer    MMR / Chicken pox     Subjective: Pt presents for an OV with need of MMR and varicella proof of immunity for employment. Vaccinations for MMR was completed in [place] if completed. She reports h/o varicella as a child. She also may need Td, if she can not verify she received last 10 years. We have her reported Td 12/06/2014- however this was not provided here and pt reported. She may also need the quant gold TB screen., but thinks her new employer will complete.  She request referral to rheumatologist locally. She has seen chronic pain for her OA and RA. However, she had a rheumatologist in [place] and would like to establish here.   Depression screen [facility] 2/9 04/05/2018 03/16/2018 02/09/2018 12/16/2017 11/25/2017  Decreased Interest 0 0 0 0 0  Down, Depressed, Hopeless 0 0 0 0 0  PHQ - 2 Score 0 0 0 0 0    No Known Allergies Social History   Tobacco Use  . Smoking status: Never Smoker  . Smokeless tobacco: Never Used  Substance Use Topics  . Alcohol use: No    Comment: a glass of wine occasionally   Past Medical History:  Diagnosis Date  . Anemia   . Arthritis   . Cervical spine degeneration 2010   C5-6  . History of carpal tunnel syndrome   . Hyperlipidemia   . Hypertension   . Sacroiliac joint pain 2010   right  . Scoliosis   . Skin cancer of eyelid    Past Surgical History:  Procedure Laterality Date  . BREAST SURGERY  1986   reduction  . cervical facet block  2010  . HAND SURGERY Bilateral 2000 and  2016   benign tumor removal   . I&D EXTREMITY Left 08/10/2017   Procedure: DEBRIDEMENT DISTAL INTERPHALANGEAL JOINT;  Surgeon: Leanora Cover, MD;  Location: [facility];  Service: Orthopedics;  Laterality: Left;  . left finger surgery Left 08/2017  . MASS EXCISION Left 08/10/2017   Procedure: LEFT LONG FINGER EXCISION MASS;  Surgeon: Leanora Cover, MD;  Location: [facility];  Service: Orthopedics;  Laterality: Left;  . WISDOM TOOTH EXTRACTION  1979   Family History  Problem Relation Age of Onset  . Hypertension Mother   . Arthritis Mother   . Hypertension Father   . Liver cancer Father   . Hypertension Sister   . Arthritis Maternal Aunt   . Hypertension Maternal Grandmother   . Kidney cancer Maternal Grandmother   . Alzheimer's disease Maternal Grandfather   . Colon cancer Maternal Grandfather 57  . Diabetes Maternal Grandfather   . Alzheimer's disease Paternal Grandmother   . Esophageal cancer Neg Hx   . Rectal cancer Neg Hx   . Stomach cancer Neg Hx    Allergies as of 05/03/2018   No Known Allergies     Medication List        Accurate as of 05/03/18 11:05 AM. Always use your most recent med list.  ALOE 52778 & PROBIOTICS Caps Take by mouth.   gabapentin 300 MG capsule Commonly known as:  NEURONTIN 300 mg qday, 600 mg qhs   GLUCOS-CHONDROIT-CA-MG-C-D PO Take by mouth daily.   Green Tea 315 MG Caps Take by mouth daily.   ibuprofen 200 MG tablet Commonly known as:  ADVIL,MOTRIN Take 400 mg by mouth every 4 (four) hours as needed.   losartan 50 MG tablet Commonly known as:  COZAAR Take 1 tablet (50 mg total) by mouth daily.   MENOPAUSE RELIEF PO Take 2 tablets by mouth daily.   MULTI-VITAMIN GUMMIES Chew Chew 1 Units by mouth daily.   multivitamin Liqd Take 5 mLs by mouth daily.   NON FORMULARY   Omega 3 1000 MG Caps Take 1 capsule by mouth daily.   pravastatin 10 MG tablet Commonly known as:  PRAVACHOL Take 1  tablet (10 mg total) by mouth daily.   tizanidine 2 MG capsule Commonly known as:  ZANAFLEX Take 1 capsule (2 mg total) by mouth 3 (three) times daily as needed for muscle spasms.       All past medical history, surgical history, allergies, family history, immunizations andmedications were updated in the EMR today and reviewed under the history and medication portions of their EMR.     ROS: Negative, with the exception of above mentioned in HPI   Objective:  BP 111/78 (BP Location: Left Arm, Patient Position: Sitting, Cuff Size: Normal)   Pulse 84   Resp 16   Ht 5' (1.524 m)   Wt 152 lb (68.9 kg)   LMP  (LMP Unknown) Comment: LMP x 4 years  SpO2 96%   BMI 29.69 kg/m  Body mass index is 29.69 kg/m. Gen: Afebrile. No acute distress. Nontoxic in appearance, well developed, well nourished.  HENT: AT. [place].MMM, no oral lesions.   Eyes:Pupils Equal Round Reactive to light, Extraocular movements intact,  Conjunctiva without redness, discharge or icterus. CV: RRR, noedema Chest: CTAB, no wheeze or crackles. Good air movement, normal resp effort.  Abd: Soft. NTND. BS present. no Masses palpated. No rebound or guarding.  Skin: no rashes, purpura or petechiae.  Neuro:  Normal gait. PERLA. EOMi. Alert. Oriented x3  Psych: Normal affect, dress and demeanor. Normal speech. Normal thought content and judgment.  No exam data present No results found. No results found for this or any previous visit (from the past 24 hour(s)).  Assessment/Plan: Sarah Fisher is a 57 y.o. female present for OV for  Immunity status testing - required for new employment. No prior testing. If she finds she also needs the Td vaccine and Quant gold TB screen, she will call back and ok to make Nurse visit and lab visit only for those since we discussed them today.  - Measles/Mumps/Rubella Immunity - Varicella zoster antibody, IgG  Rheumatoid arthritis, seronegative, shoulder region (Bilateral) (L>R) -  established w/ rheum in [place] and would like to continue here.  -  Ambulatory referral to Rheumatology   Reviewed expectations re: course of current medical issues.  Discussed self-management of symptoms.  Outlined signs and symptoms indicating need for more acute intervention.  Patient verbalized understanding and all questions were answered.  Patient received an After-Visit Summary.    No orders of the defined types were placed in this encounter.    Note is dictated utilizing voice recognition software. Although note has been proof read prior to signing, occasional typographical errors still can be missed. If any questions arise, please do not  hesitate to call for verification.   electronically signed by:  Howard Pouch, DO  [place]

## 2018-05-04 LAB — MEASLES/MUMPS/RUBELLA IMMUNITY
Mumps IgG: 82.2 AU/mL
Rubella: 13.4 index
Rubeola IgG: <25 AU/mL — ABNORMAL LOW

## 2018-05-04 LAB — VARICELLA ZOSTER ANTIBODY, IGG: VARICELLA IGG: 885.3 {index}

## 2018-05-18 ENCOUNTER — Ambulatory Visit (INDEPENDENT_AMBULATORY_CARE_PROVIDER_SITE_OTHER): Payer: BLUE CROSS/BLUE SHIELD

## 2018-05-18 DIAGNOSIS — Z23 Encounter for immunization: Secondary | ICD-10-CM

## 2018-05-18 NOTE — Progress Notes (Addendum)
Patient presents for MMR injection. Patient titer showed no immunity, given per order by Dr. Raoul Pitch. Patient given vaccine information and tolerated well.   Medical screening examination/treatment/procedure(s) were performed by non-physician practitioner and as supervising physician I was immediately available for consultation/collaboration.  I agree with above assessment and plan.  Electronically Signed by: Howard Pouch, DO [place] [facility]

## 2018-06-02 NOTE — Progress Notes (Deleted)
Office Visit Note  Patient: Sarah Fisher             Date of Birth: 1960-09-08           MRN: [id number]             PCP: Ma Hillock, DO Referring: Ma Hillock, DO Visit Date: 06/16/2018 Occupation: @GUAROCC @  Subjective:  No chief complaint on file.   History of Present Illness: Sarah Fisher is a 57 y.o. female ***   Activities of Daily Living:  Patient reports morning stiffness for *** {minute/hour:19697}.   Patient {ACTIONS;DENIES/REPORTS:21021675::"Denies"} nocturnal pain.  Difficulty dressing/grooming: {ACTIONS;DENIES/REPORTS:21021675::"Denies"} Difficulty climbing stairs: {ACTIONS;DENIES/REPORTS:21021675::"Denies"} Difficulty getting out of chair: {ACTIONS;DENIES/REPORTS:21021675::"Denies"} Difficulty using hands for taps, buttons, cutlery, and/or writing: {ACTIONS;DENIES/REPORTS:21021675::"Denies"}  No Rheumatology ROS completed.   PMFS History:  Patient Active Problem List   Diagnosis Date Noted  . Long term (current) use of opiate analgesic 10/21/2016  . Chronic pain syndrome 10/01/2016  . Osteoarthritis of cervical spine 10/01/2016  . Cervical DDD (degenerative disc disease) (C5-6) 10/01/2016  . Cervical central spinal stenosis C5-6 (Right) 10/01/2016  . Chronic neck pain (Location of Primary Source of Pain) (Bilateral) (L>R) 10/01/2016  . Cervicogenic headache (Bilateral) (R>L) 10/01/2016  . Occipital headache (Bilateral) (R>L) 10/01/2016  . Neurogenic pain 10/01/2016  . Myofascial pain 10/01/2016  . Chronic shoulder pain (Location of Tertiary source of pain) (Bilateral) (L>R) 10/01/2016  . Chronic upper back pain (Location of Secondary source of pain) (Bilateral) (L>R) 10/01/2016  . Chronic low back pain (Bilateral) (L>R) 10/01/2016  . Cervical spondylosis 10/01/2016  . Numbness of upper extremity (Bilateral) (L>R) 10/01/2016  . Chronic cervical radiculopathy (Bilateral) (L>R) 10/01/2016  . Chronic knee pain (Bilateral) (R>L) 10/01/2016  .  Osteoarthritis of knee (Bilateral) (R>L) 10/01/2016  . Rheumatoid arthritis, seronegative, shoulder region (Bilateral) (L>R) 10/01/2016  . Chronic idiopathic constipation 10/01/2016  . Benign paroxysmal positional vertigo of left ear 09/03/2016  . Adenomatous polyp of colon 09/03/2016  . Vitamin D deficiency 05/16/2016  . Overweight 05/15/2016  . Hyperlipidemia 04/14/2016  . Hypertension     Past Medical History:  Diagnosis Date  . Anemia   . Arthritis   . Cervical spine degeneration 2010   C5-6  . History of carpal tunnel syndrome   . Hyperlipidemia   . Hypertension   . Sacroiliac joint pain 2010   right  . Scoliosis   . Skin cancer of eyelid     Family History  Problem Relation Age of Onset  . Hypertension Mother   . Arthritis Mother   . Hypertension Father   . Liver cancer Father   . Hypertension Sister   . Arthritis Maternal Aunt   . Hypertension Maternal Grandmother   . Kidney cancer Maternal Grandmother   . Alzheimer's disease Maternal Grandfather   . Colon cancer Maternal Grandfather 41  . Diabetes Maternal Grandfather   . Alzheimer's disease Paternal Grandmother   . Esophageal cancer Neg Hx   . Rectal cancer Neg Hx   . Stomach cancer Neg Hx    Past Surgical History:  Procedure Laterality Date  . BREAST SURGERY  1986   reduction  . cervical facet block  2010  . HAND SURGERY Bilateral 2000 and 2016   benign tumor removal   . I&D EXTREMITY Left 08/10/2017   Procedure: DEBRIDEMENT DISTAL INTERPHALANGEAL JOINT;  Surgeon: Sarah Cover, MD;  Location: [facility];  Service: Orthopedics;  Laterality: Left;  . left finger surgery Left 08/2017  .  MASS EXCISION Left 08/10/2017   Procedure: LEFT LONG FINGER EXCISION MASS;  Surgeon: Sarah Cover, MD;  Location: [facility];  Service: Orthopedics;  Laterality: Left;  . [place]   Social History   Social History Narrative   Divorced. 3 children, one lives with her  Sarah Fisher).   She is from [place], moved in 2016.    Housewife. B.A. Degree.    Drinks caffeine, uses herbal remedies. Takes a daily vitamin.    Wears her seatbelt, bicycle helmet.Smoke detector in the home.    Exercises routinely.    Feels safe in her relationships.     Objective: Vital Signs: LMP  (LMP Unknown) Comment: LMP x 4 years   Physical Exam   Musculoskeletal Exam: ***  CDAI Exam: CDAI Score: Not documented Patient Global Assessment: Not documented; Provider Global Assessment: Not documented Swollen: Not documented; Tender: Not documented Joint Exam   Not documented   There is currently no information documented on the homunculus. Go to the Rheumatology activity and complete the homunculus joint exam.  Investigation: Findings:  10/01/17: RF <10, CCP 3, ANA negative, CRP <0.8, sed rate 21, vitamin B12 674, Mg 2.2  Component     Latest Ref Rng & Units 10/01/2016  Sodium     135 - 145 mmol/L 139  Potassium     3.5 - 5.1 mmol/L 3.9  Chloride     101 - 111 mmol/L 105  CO2     22 - 32 mmol/L 26  Glucose     65 - 99 mg/dL 97  BUN     6 - 20 mg/dL 17  Creatinine     0.44 - 1.00 mg/dL 0.61  Calcium     8.9 - 10.3 mg/dL 9.4  Total Protein     6.5 - 8.1 g/dL 7.4  Albumin     3.5 - 5.0 g/dL 4.4  AST     15 - 41 U/L 23  ALT     14 - 54 U/L 18  Alkaline Phosphatase     38 - 126 U/L 57  Total Bilirubin     0.3 - 1.2 mg/dL 0.8  GFR, Est Non African American     >60 mL/min >60  GFR, Est African American     >60 mL/min >60  Anion gap     5 - 15 8  25-Hydroxy, Vitamin D     ng/mL 40  25-Hydroxy, Vitamin D-2     ng/mL 18  25-Hydroxy, Vitamin D-3     ng/mL 22  RA Latex Turbid.     0.0 - 13.9 IU/mL <10.0  CCP Antibodies IgG/IgA     0 - 19 units 3  Anti Nuclear Antibody(ANA)     Negative Negative  CRP     <1.0 mg/dL <0.8  Magnesium     1.7 - 2.4 mg/dL 2.2  Sed Rate     0 - 30 mm/hr 21  Vitamin B12     180 - 914 pg/mL 674   Imaging: No results  found.  Recent Labs: Lab Results  Component Value Date   WBC 5.6 06/10/2017   HGB 12.7 06/10/2017   PLT 322.0 06/10/2017   NA 142 06/10/2017   K 4.2 06/10/2017   CL 103 06/10/2017   CO2 30 06/10/2017   GLUCOSE 89 06/10/2017   BUN 18 06/10/2017   CREATININE 0.67 06/10/2017   BILITOT 0.5 06/10/2017   ALKPHOS 63 06/10/2017   AST  17 06/10/2017   ALT 16 06/10/2017   PROT 7.1 06/10/2017   ALBUMIN 4.7 06/10/2017   CALCIUM 9.6 06/10/2017   GFRAA >60 10/01/2016    Speciality Comments: No specialty comments available.  Procedures:  No procedures performed Allergies: Patient has no known allergies.   Assessment / Plan:     Visit Diagnoses: Seronegative rheumatoid arthritis (HCC)  Myofascial pain  Neurogenic pain  Chronic pain syndrome  Chronic neck pain (Location of Primary Source of Pain) (Bilateral) (L>R)  Long term (current) use of opiate analgesic  History of hyperlipidemia  History of vitamin D deficiency  Essential hypertension  History of anemia  History of colonic polyps  History of skin cancer   Orders: No orders of the defined types were placed in this encounter.  No orders of the defined types were placed in this encounter.   Face-to-face time spent with patient was *** minutes. Greater than 50% of time was spent in counseling and coordination of care.  Follow-Up Instructions: No follow-ups on file.   Ofilia Neas, PA-C  Note - This record has been created using Dragon software.  Chart creation errors have been sought, but may not always  have been located. Such creation errors do not reflect on  the standard of medical care.

## 2018-06-09 LAB — HM MAMMOGRAPHY

## 2018-06-11 ENCOUNTER — Encounter: Payer: Self-pay | Admitting: Family Medicine

## 2018-06-11 ENCOUNTER — Telehealth: Payer: Self-pay | Admitting: Family Medicine

## 2018-06-11 ENCOUNTER — Encounter: Payer: Self-pay | Admitting: *Deleted

## 2018-06-11 DIAGNOSIS — R928 Other abnormal and inconclusive findings on diagnostic imaging of breast: Secondary | ICD-10-CM

## 2018-06-11 NOTE — Telephone Encounter (Signed)
Please inform patient the following information: - I attempted to call her personally but received a VM.  Her mammogram of her left breast needs additional view. The radiologist viewed a very small POTENTIAL irregularity that only showed on one view and they need to get an additional view to confirm or rule out a concern. If they have not called her yet to schedule, please have Diane arrange for her.     Staff FYI: - I have placed an order for the additional view. Order may need to be changed- the order they requested is specific and can not be found in epic. I also ordered an [place] in the event it is felt needed after the additional image.

## 2018-06-14 NOTE — Telephone Encounter (Signed)
LM for patient to call for results.

## 2018-06-14 NOTE — Telephone Encounter (Signed)
Patient is coming in tomorrow for her CPE. If she does not return call, results can be given at this appt.

## 2018-06-15 ENCOUNTER — Other Ambulatory Visit (HOSPITAL_COMMUNITY)
Admission: RE | Admit: 2018-06-15 | Discharge: 2018-06-15 | Disposition: A | Discharge status: Home / Self Care | Payer: BLUE CROSS/BLUE SHIELD | Source: Ambulatory Visit | Attending: Family Medicine | Admitting: Family Medicine

## 2018-06-15 ENCOUNTER — Ambulatory Visit (INDEPENDENT_AMBULATORY_CARE_PROVIDER_SITE_OTHER): Payer: BLUE CROSS/BLUE SHIELD | Admitting: Family Medicine

## 2018-06-15 ENCOUNTER — Encounter: Payer: Self-pay | Admitting: Family Medicine

## 2018-06-15 VITALS — BP 125/86 | HR 59 | Temp 98.0°F | Resp 16 | Ht 60.0 in | Wt 152.0 lb

## 2018-06-15 DIAGNOSIS — Z131 Encounter for screening for diabetes mellitus: Secondary | ICD-10-CM

## 2018-06-15 DIAGNOSIS — Z01419 Encounter for gynecological examination (general) (routine) without abnormal findings: Secondary | ICD-10-CM | POA: Insufficient documentation

## 2018-06-15 DIAGNOSIS — Z0000 Encounter for general adult medical examination without abnormal findings: Secondary | ICD-10-CM

## 2018-06-15 DIAGNOSIS — E559 Vitamin D deficiency, unspecified: Secondary | ICD-10-CM

## 2018-06-15 DIAGNOSIS — I10 Essential (primary) hypertension: Secondary | ICD-10-CM

## 2018-06-15 DIAGNOSIS — E785 Hyperlipidemia, unspecified: Secondary | ICD-10-CM

## 2018-06-15 DIAGNOSIS — E663 Overweight: Secondary | ICD-10-CM

## 2018-06-15 DIAGNOSIS — D126 Benign neoplasm of colon, unspecified: Secondary | ICD-10-CM

## 2018-06-15 LAB — CBC WITH DIFFERENTIAL/PLATELET
Basophils Absolute: 0 10*3/uL (ref 0.0–0.1)
Basophils Relative: 0.5 % (ref 0.0–3.0)
Eosinophils Absolute: 0.1 10*3/uL (ref 0.0–0.7)
Eosinophils Relative: 2.3 % (ref 0.0–5.0)
HCT: 35.8 % — ABNORMAL LOW (ref 36.0–46.0)
Hemoglobin: 12.2 g/dL (ref 12.0–15.0)
Lymphocytes Relative: 30.5 % (ref 12.0–46.0)
Lymphs Abs: 1.5 10*3/uL (ref 0.7–4.0)
MCHC: 34.1 g/dL (ref 30.0–36.0)
MCV: 88.2 fl (ref 78.0–100.0)
Monocytes Absolute: 0.4 10*3/uL (ref 0.1–1.0)
Monocytes Relative: 8.4 % (ref 3.0–12.0)
Neutro Abs: 2.9 10*3/uL (ref 1.4–7.7)
Neutrophils Relative %: 58.3 % (ref 43.0–77.0)
Platelets: 292 10*3/uL (ref 150.0–400.0)
RBC: 4.06 Mil/uL (ref 3.87–5.11)
RDW: 12.9 % (ref 11.5–15.5)
WBC: 4.9 10*3/uL (ref 4.0–10.5)

## 2018-06-15 LAB — COMPREHENSIVE METABOLIC PANEL
ALT: 14 U/L (ref 0–35)
AST: 18 U/L (ref 0–37)
Albumin: 4.4 g/dL (ref 3.5–5.2)
Alkaline Phosphatase: 55 U/L (ref 39–117)
BUN: 21 mg/dL (ref 6–23)
CO2: 28 mEq/L (ref 19–32)
Calcium: 9.3 mg/dL (ref 8.4–10.5)
Chloride: 107 mEq/L (ref 96–112)
Creatinine, Ser: 0.63 mg/dL (ref 0.40–1.20)
GFR: 103.25 mL/min (ref >60.00)
Glucose, Bld: 91 mg/dL (ref 70–99)
POTASSIUM: 3.8 meq/L (ref 3.5–5.1)
Sodium: 141 mEq/L (ref 135–145)
Total Bilirubin: 0.5 mg/dL (ref 0.2–1.2)
Total Protein: 6.5 g/dL (ref 6.0–8.3)

## 2018-06-15 LAB — LIPID PANEL
Cholesterol: 189 mg/dL (ref 0–200)
HDL: 62.4 mg/dL (ref >39.00)
LDL Cholesterol: 99 mg/dL (ref 0–99)
NonHDL: 126.15
Total CHOL/HDL Ratio: 3
Triglycerides: 136 mg/dL (ref 0.0–149.0)
VLDL: 27.2 mg/dL (ref 0.0–40.0)

## 2018-06-15 LAB — HEMOGLOBIN A1C: Hgb A1c MFr Bld: 5.7 % (ref 4.6–6.5)

## 2018-06-15 LAB — VITAMIN D 25 HYDROXY (VIT D DEFICIENCY, FRACTURES): VITD: 24.45 ng/mL — ABNORMAL LOW (ref 30.00–100.00)

## 2018-06-15 LAB — TSH: TSH: 1.81 u[IU]/mL (ref 0.35–4.50)

## 2018-06-15 LAB — HM PAP SMEAR: HM Pap smear: NEGATIVE

## 2018-06-15 MED ORDER — PRAVASTATIN SODIUM 10 MG PO TABS: 10.0000 mg | ORAL_TABLET | Freq: Every day | ORAL | 3 refills | Status: DC

## 2018-06-15 MED ORDER — LOSARTAN POTASSIUM 50 MG PO TABS: 50.0000 mg | ORAL_TABLET | Freq: Every day | ORAL | 1 refills | Status: DC

## 2018-06-15 NOTE — Telephone Encounter (Signed)
Patient notified at time of visit and verbalized understanding.

## 2018-06-15 NOTE — Progress Notes (Signed)
Patient ID: Sarah Fisher, female  DOB: 12/29/1960, 57 y.o.   MRN: [id number] Patient Care Team    Relationship Specialty Notifications Start End  Ma Hillock, DO PCP - General Family Medicine  04/14/16   Daryll Brod, MD Consulting Physician Orthopedic Surgery  06/10/17   Mauri Pole, MD Consulting Physician Gastroenterology  06/10/17   Milinda Pointer, MD Referring Physician Pain Medicine  06/10/17     Chief Complaint  Patient presents with  . Annual Exam    Subjective:  Sarah Fisher is a 57 y.o.  Female  present for CPE. All past medical history, surgical history, allergies, family history, immunizations, medications and social history were updated in the electronic medical record today. All recent labs, ED visits and hospitalizations within the last year were reviewed.  Health maintenance:  Colonoscopy: fhx present in MGF. 08/01/2016 Dr. Silverio Decamp Mammogram: completed: 06/09/2018- incomplete. Additional studies ordered - pt was made aware.  Cervical cancer screening: last pap: 2016, results: normal per pt. [place] 2016 with submucous and intramural myomas 12.2 and 9.2 mm. PAP with HPV completed today Immunizations: tdap 12/2014, Influenza 2019(encouraged yearly) Infectious disease screening: HIV and HEP c completed DEXA: never, routine screen. Assistive device: none Oxygen IZT:IWPY Patient has a Dental home. Hospitalizations/ED visits: reviewed   Depression screen [facility] 2/9 06/15/2018 04/05/2018 03/16/2018 02/09/2018 12/16/2017  Decreased Interest 0 0 0 0 0  Down, Depressed, Hopeless 0 0 0 0 0  PHQ - 2 Score 0 0 0 0 0   No flowsheet data found.   Current Exercise Habits: Structured exercise class, Type of exercise: treadmill, Time (Minutes): 30, Frequency (Times/Week): 3, Weekly Exercise (Minutes/Week): 90, Intensity: Mild Exercise limited by: cardiac condition(s)   Immunization History  Administered Date(s) Administered  . Influenza Inj Mdck Quad Pf  04/15/2018  . Influenza,inj,Quad PF,6+ Mos 04/14/2016, 06/03/2017  . MMR 05/18/2018  . Tdap 12/06/2014     Past Medical History:  Diagnosis Date  . Anemia   . Arthritis   . Cervical spine degeneration 2010   C5-6  . History of carpal tunnel syndrome   . Hyperlipidemia   . Hypertension   . Sacroiliac joint pain 2010   right  . Scoliosis   . Skin cancer of eyelid    No Known Allergies Past Surgical History:  Procedure Laterality Date  . BREAST SURGERY  1986   reduction  . cervical facet block  2010  . HAND SURGERY Bilateral 2000 and 2016   benign tumor removal   . I&D EXTREMITY Left 08/10/2017   Procedure: DEBRIDEMENT DISTAL INTERPHALANGEAL JOINT;  Surgeon: Leanora Cover, MD;  Location: [facility];  Service: Orthopedics;  Laterality: Left;  . left finger surgery Left 08/2017  . MASS EXCISION Left 08/10/2017   Procedure: LEFT LONG FINGER EXCISION MASS;  Surgeon: Leanora Cover, MD;  Location: [facility];  Service: Orthopedics;  Laterality: Left;  . WISDOM TOOTH EXTRACTION  1979   Family History  Problem Relation Age of Onset  . Hypertension Mother   . Arthritis Mother   . Hypertension Father   . Liver cancer Father   . Hypertension Sister   . Arthritis Maternal Aunt   . Hypertension Maternal Grandmother   . Kidney cancer Maternal Grandmother   . Alzheimer's disease Maternal Grandfather   . Colon cancer Maternal Grandfather 52  . Diabetes Maternal Grandfather   . Alzheimer's disease Paternal Grandmother   . Esophageal cancer Neg Hx   . Rectal  cancer Neg Hx   . Stomach cancer Neg Hx    Social History   Socioeconomic History  . Marital status: Divorced    Spouse name: Not on file  . Number of children: 3  . Years of education: 96  . Highest education level: Not on file  Occupational History  . Occupation: housewife  Social Needs  . Financial resource strain: Not on file  . Food insecurity:    Worry: Not on file    Inability: Not on  file  . Transportation needs:    Medical: Not on file    Non-medical: Not on file  Tobacco Use  . Smoking status: Never Smoker  . Smokeless tobacco: Never Used  Substance and Sexual Activity  . Alcohol use: No    Comment: a glass of wine occasionally  . Drug use: No  . Sexual activity: Yes    Partners: Male    Birth control/protection: None  Lifestyle  . Physical activity:    Days per week: Not on file    Minutes per session: Not on file  . Stress: Not on file  Relationships  . Social connections:    Talks on phone: Not on file    Gets together: Not on file    Attends religious service: Not on file    Active member of club or organization: Not on file    Attends meetings of clubs or organizations: Not on file    Relationship status: Not on file  . Intimate partner violence:    Fear of current or ex partner: Not on file    Emotionally abused: Not on file    Physically abused: Not on file    Forced sexual activity: Not on file  Other Topics Concern  . Not on file  Social History Narrative   Divorced. 3 children, one lives with her [name]).   She is from [place], moved in 2016.    Housewife. B.A. Degree.    Drinks caffeine, uses herbal remedies. Takes a daily vitamin.    Wears her seatbelt, bicycle helmet.Smoke detector in the home.    Exercises routinely.    Feels safe in her relationships.    Allergies as of 06/15/2018   No Known Allergies     Medication List        Accurate as of 06/15/18 12:27 PM. Always use your most recent med list.          ALOE 90240 & PROBIOTICS Caps Take by mouth.   gabapentin 300 MG capsule Commonly known as:  NEURONTIN 300 mg qday, 600 mg qhs   GLUCOS-CHONDROIT-CA-MG-C-D PO Take by mouth daily.   Green Tea 315 MG Caps Take by mouth daily.   ibuprofen 200 MG tablet Commonly known as:  ADVIL,MOTRIN Take 400 mg by mouth every 4 (four) hours as needed.   losartan 50 MG tablet Commonly known as:  COZAAR Take 1 tablet  (50 mg total) by mouth daily.   MULTI-VITAMIN GUMMIES Chew Chew 1 Units by mouth daily.   multivitamin Liqd Take 5 mLs by mouth daily.   NON FORMULARY   Omega 3 1000 MG Caps Take 1 capsule by mouth daily.   pravastatin 10 MG tablet Commonly known as:  PRAVACHOL Take 1 tablet (10 mg total) by mouth daily.   tizanidine 2 MG capsule Commonly known as:  ZANAFLEX Take 1 capsule (2 mg total) by mouth 3 (three) times daily as needed for muscle spasms.  All past medical history, surgical history, allergies, family history, immunizations andmedications were updated in the EMR today and reviewed under the history and medication portions of their EMR.     Recent Results (from the past 2160 hour(s))  Measles/Mumps/Rubella Immunity     Status: Abnormal   Collection Time: 05/03/18 11:25 AM  Result Value Ref Range   Rubeola IgG <25.00 (L) AU/mL    Comment: AU/mL            Interpretation -----            -------------- <25.00           Negative 25.00-29.99      Equivocal >29.99           Positive . A positive result indicates that the patient has antibody to measles virus. It does not differentiate  between an active or past infection. The clinical  diagnosis must be interpreted in conjunction with  clinical signs and symptoms of the patient.    Mumps IgG 82.20 AU/mL    Comment:  AU/mL           Interpretation -------         ---------------- <9.00             Negative 9.00-10.99        Equivocal >10.99            Positive A positive result indicates that the patient has  antibody to mumps virus. It does not differentiate between an  active or past infection. The clinical diagnosis must be interpreted in conjunction with clinical signs and symptoms of the patient. .    Rubella 13.40 index    Comment:     Index            Interpretation     -----            --------------       <0.90            Not consistent with Immunity     0.90-0.99        Equivocal     > or =  1.00      Consistent with Immunity  . The presence of rubella IgG antibody suggests  immunization or past or current infection with rubella virus.   Varicella zoster antibody, IgG     Status: None   Collection Time: 05/03/18 11:25 AM  Result Value Ref Range   Varicella IgG 885.30 index    Comment:        Index               Interpretation      ---------         ----------------------     <135.00            Negative - Antibody not detected     135.00 - 164.99    Equivocal     > or = 165.00      Positive - Antibody detected .     A positive result indicates that the patient     has antibody to VZV but does not differentiate     between an active or past infection.      The clinical diagnosis must be interpreted in      conjunction with the clinical signs and symptoms of      the patient. This assay reliably measures immunity     due to previous infection but may not be  sensitive enough to detect antibodies induced by     vaccination. Thus, a negative result in a vaccinated     individual does not necessarily indicate     susceptibility to VZV infection. A more sensitive     test for vaccination-induced immunity is Varicella     Zoster Virus Antibody Immunity Screen, ACIF.   HM MAMMOGRAPHY     Status: None   Collection Time: 06/09/18 12:00 AM  Result Value Ref Range   HM Mammogram 0-4 Bi-Rad 0-4 Bi-Rad, Self Reported Normal    Comment: incomplete 0.3cm irregular asymmetry in L breast indetermina     ROS: 14 pt review of systems performed and negative (unless mentioned in an HPI) Objective: BP 125/86 (BP Location: Right Arm, Patient Position: Sitting, Cuff Size: Normal)   Pulse (!) 59   Temp 98 F (36.7 C) (Oral)   Resp 16   Ht 5' (1.524 m)   Wt 152 lb (68.9 kg)   LMP  (LMP Unknown) Comment: LMP x 4 years  SpO2 98%   BMI 29.69 kg/m  Gen: Afebrile. No acute distress. Nontoxic in appearance, well-developed, well-nourished,  Overweight female.  HENT: AT. [place].  Bilateral TM visualized and normal in appearance, normal external auditory canal. MMM, no oral lesions, adequate dentition. Bilateral nares within normal limits. Throat without erythema, ulcerations or exudates. no Cough on exam, no hoarseness on exam. Eyes:Pupils Equal Round Reactive to light, Extraocular movements intact,  Conjunctiva without redness, discharge or icterus. Neck/lymp/endocrine: Supple,no lymphadenopathy, no thyromegaly CV: RRR no murmur, no edema, +2/4 P posterior tibialis pulses. no carotid bruits. No JVD. Chest: CTAB, no wheeze, rhonchi or crackles. normal Respiratory effort. good Air movement. Abd: Soft. flat. NTND. BS presetn. no Masses palpated. No hepatosplenomegaly. No rebound tenderness or guarding. Skin: no rashes, purpura or petechiae. Warm and well-perfused. Skin intact. Neuro/Msk:  Normal gait. PERLA. EOMi. Alert. Oriented x3.  Cranial nerves II through XII intact. Muscle strength 5/5 upper/lower extremity. DTRs equal bilaterally. Psych: Normal affect, dress and demeanor. Normal speech. Normal thought content and judgment. Breasts: breasts appear normal, symmetrical, no tenderness on exam, no suspicious masses, no skin or nipple changes or axillary nodes. GYN:  External genitalia within normal limits, normal hair distribution, no lesions. Urethral meatus normal, no lesions. Vaginal mucosa pink, moist, normal rugae, no lesions. No cystocele or rectocele. cervix without lesions, no discharge. Bimanual exam revealed normal uterus.  No bladder/suprapubic fullness, masses or tenderness. No cervical motion tenderness. No adnexal fullness. Anus and perineum within normal limits, no lesions.  No exam data present  Assessment/plan: Sarah Fisher is a 57 y.o. female present for CPE. Essential hypertension/HLD/overweight Stable. Refills provided on cozaar 50 QD and pravastatin.  - CBC w/Diff - Comp Met (CMET) - Lipid panel - TSH - losartan (COZAAR) 50 MG tablet; Take 1  tablet (50 mg total) by mouth daily.  Dispense: 90 tablet; Refill: 1 - f/u 6 mos.  Vitamin D deficiency - Vitamin D (25 hydroxy) Adenomatous polyp of colon, unspecified part of colon Colonoscopy UTD Diabetes mellitus screening - HgB A1c Encounter for routine gynecological examination with Papanicolaou smear of cervix - Cytology - PAP( [facility]) w HPV cotest Encounter for preventive health examination Patient was encouraged to exercise greater than 150 minutes a week. Patient was encouraged to choose a diet filled with fresh fruits and vegetables, and lean meats. AVS provided to patient today for education/recommendation on gender specific health and safety maintenance. Colonoscopy: fhx present in MGF. 08/01/2016 nand[place]am Mammogram:  completed: 06/09/2018- incomplete. Additional studies ordered today.  Cervical cancer screening: last pap: 2016, results: normal per pt. [place] 2016 with submucous and intramural myomas 12.2 and 9.2 mm. PAP completed today Immunizations: tdap 12/2014, Influenza 2019(encouraged yearly) Infectious disease screening: HIV and HEP C completed DEXA: screen at 62   Return in about 1 year (around 06/16/2019) for CPE. 6 mos [facility]  Electronically signed by: Howard Pouch, DO [place]

## 2018-06-15 NOTE — Patient Instructions (Signed)

## 2018-06-16 ENCOUNTER — Ambulatory Visit: Payer: Self-pay | Admitting: Rheumatology

## 2018-06-16 LAB — CYTOLOGY - PAP: Diagnosis: NEGATIVE

## 2018-06-17 ENCOUNTER — Encounter: Payer: Self-pay | Admitting: Family Medicine

## 2018-06-17 ENCOUNTER — Telehealth: Payer: Self-pay | Admitting: Family Medicine

## 2018-06-17 MED ORDER — FLUCONAZOLE 150 MG PO TABS: 150.0000 mg | ORAL_TABLET | Freq: Once | ORAL | 0 refills | Status: AC

## 2018-06-17 NOTE — Telephone Encounter (Signed)
Ok for [facility] to discuss results w pot.

## 2018-06-17 NOTE — Telephone Encounter (Signed)
Please inform patient the following information: Labs are normal- with the exception of mildly low vit d levels. If she is not taking a daily supplement, I would recommend she start 800u daily of OTC vit d.  Her PAP is also normal- there was some evidence of a mild yeast infection by lab result. I have called in diflucan x1 for her.

## 2018-06-17 NOTE — Telephone Encounter (Signed)
L/M for pt to lab back for lab results.

## 2018-06-21 ENCOUNTER — Other Ambulatory Visit: Payer: Self-pay | Admitting: Student in an Organized Health Care Education/Training Program

## 2018-06-23 ENCOUNTER — Telehealth: Payer: Self-pay | Admitting: Family Medicine

## 2018-06-23 NOTE — Telephone Encounter (Signed)
Result note/message read to patient; verbalizes understanding. Telephone encounter as results removed from que; results from 06/17/18.

## 2018-07-21 ENCOUNTER — Ambulatory Visit: Payer: Self-pay | Admitting: Rheumatology

## 2018-07-22 ENCOUNTER — Ambulatory Visit: Payer: Self-pay | Admitting: Rheumatology

## 2018-09-04 ENCOUNTER — Other Ambulatory Visit: Payer: Self-pay | Admitting: Nurse Practitioner

## 2018-09-04 DIAGNOSIS — M545 Low back pain, unspecified: Secondary | ICD-10-CM

## 2018-09-04 DIAGNOSIS — M7918 Myalgia, other site: Secondary | ICD-10-CM

## 2018-09-04 DIAGNOSIS — G8929 Other chronic pain: Secondary | ICD-10-CM

## 2018-09-09 ENCOUNTER — Telehealth: Payer: Self-pay | Admitting: Nurse Practitioner

## 2018-09-09 DIAGNOSIS — G8929 Other chronic pain: Secondary | ICD-10-CM

## 2018-09-09 DIAGNOSIS — M7918 Myalgia, other site: Secondary | ICD-10-CM

## 2018-09-09 DIAGNOSIS — M545 Low back pain: Secondary | ICD-10-CM

## 2018-09-09 NOTE — Telephone Encounter (Signed)
Pt called and stated that she needs a refill of her tizanidine.

## 2018-09-13 MED ORDER — TIZANIDINE HCL 2 MG PO CAPS: 2.0000 mg | ORAL_CAPSULE | Freq: Three times a day (TID) | ORAL | 5 refills | Status: DC | PRN

## 2018-09-13 NOTE — Addendum Note (Signed)
Addended by: Gillis Santa on: 09/13/2018 11:45 AM   Modules accepted: Orders

## 2018-09-13 NOTE — Telephone Encounter (Signed)
Patient notified Tizanidine script has been sent in to pharmacy.

## 2018-09-13 NOTE — Telephone Encounter (Signed)
Patient notified that Tizanidine has been sent in.

## 2019-01-09 ENCOUNTER — Other Ambulatory Visit: Payer: Self-pay | Admitting: Student in an Organized Health Care Education/Training Program

## 2019-02-10 ENCOUNTER — Telehealth: Payer: Self-pay

## 2019-02-10 NOTE — Telephone Encounter (Signed)
Received fax refill request for patients Losartan- last [facility] [place] was 06/2018. Pt was sent my chart message asking to make appt and enough medications could be called in to last until MD appt.

## 2019-04-13 ENCOUNTER — Encounter: Payer: Self-pay | Admitting: Family Medicine

## 2019-04-13 ENCOUNTER — Ambulatory Visit (INDEPENDENT_AMBULATORY_CARE_PROVIDER_SITE_OTHER): Payer: BLUE CROSS/BLUE SHIELD | Admitting: Family Medicine

## 2019-04-13 ENCOUNTER — Other Ambulatory Visit: Payer: Self-pay

## 2019-04-13 VITALS — BP 103/68 | HR 70 | Temp 97.2°F | Resp 17 | Ht 60.0 in | Wt 136.5 lb

## 2019-04-13 DIAGNOSIS — E785 Hyperlipidemia, unspecified: Secondary | ICD-10-CM | POA: Diagnosis not present

## 2019-04-13 DIAGNOSIS — I10 Essential (primary) hypertension: Secondary | ICD-10-CM | POA: Diagnosis not present

## 2019-04-13 DIAGNOSIS — E559 Vitamin D deficiency, unspecified: Secondary | ICD-10-CM

## 2019-04-13 DIAGNOSIS — E663 Overweight: Secondary | ICD-10-CM

## 2019-04-13 DIAGNOSIS — Z23 Encounter for immunization: Secondary | ICD-10-CM | POA: Diagnosis not present

## 2019-04-13 MED ORDER — PRAVASTATIN SODIUM 10 MG PO TABS: 10.0000 mg | ORAL_TABLET | Freq: Every day | ORAL | 1 refills | Status: DC

## 2019-04-13 MED ORDER — LOSARTAN POTASSIUM 50 MG PO TABS: 50.0000 mg | ORAL_TABLET | Freq: Every day | ORAL | 1 refills | Status: DC

## 2019-04-13 NOTE — Patient Instructions (Signed)
Keep up the good work. Follow up in about 5-6 months for physical - we will do your labs then.   Stay safe.

## 2019-04-13 NOTE — Progress Notes (Signed)
Sarah Fisher , [birth year], 58 y.o., female MRN: [id number] Patient Care Team    Relationship Specialty Notifications Start End  Ma Hillock, DO PCP - General Family Medicine  04/14/16   Daryll Brod, MD Consulting Physician Orthopedic Surgery  06/10/17   Mauri Pole, MD Consulting Physician Gastroenterology  06/10/17   Milinda Pointer, MD Referring Physician Pain Medicine  06/10/17     Chief Complaint  Patient presents with  . Hypertension    Pt needs refills on medications. Would like to start taking a low dose ASA.      Subjective:  Hypertension/hyperlipidemia/overweight:  Pt reports compliance with Losartan 50 mg QD. Patient denies chest pain, shortness of breath, dizziness or lower extremity edema.  Pt is  prescribed statin. BMP:06/2018 WNL CBC: 06/10/2018 WNL Lipid: 06/2018- good TSH: 06/2018 WNL Diet: low sodium- eating healthy- watching her weight Exercise:walking about a mile a day. RF: HLD, HTN, overweight  Vit d def: Last vit d 24.5 on 06/2018.  Depression screen [facility] 2/9 06/15/2018 04/05/2018 03/16/2018 02/09/2018 12/16/2017  Decreased Interest 0 0 0 0 0  Down, Depressed, Hopeless 0 0 0 0 0  PHQ - 2 Score 0 0 0 0 0    No Known Allergies Social History   Tobacco Use  . Smoking status: Never Smoker  . Smokeless tobacco: Never Used  Substance Use Topics  . Alcohol use: No    Comment: a glass of wine occasionally   Past Medical History:  Diagnosis Date  . Anemia   . Arthritis   . Cervical spine degeneration 2010   C5-6  . History of carpal tunnel syndrome   . Hyperlipidemia   . Hypertension   . Sacroiliac joint pain 2010   right  . Scoliosis   . Skin cancer of eyelid    Past Surgical History:  Procedure Laterality Date  . BREAST SURGERY  1986   reduction  . cervical facet block  2010  . HAND SURGERY Bilateral 2000 and 2016   benign tumor removal   . I&D EXTREMITY Left 08/10/2017   Procedure: DEBRIDEMENT DISTAL INTERPHALANGEAL JOINT;   Surgeon: Leanora Cover, MD;  Location: [place];  Service: Orthopedics;  Laterality: Left;  . left finger surgery Left 08/2017  . MASS EXCISION Left 08/10/2017   Procedure: LEFT LONG FINGER EXCISION MASS;  Surgeon: Leanora Cover, MD;  Location: [place];  Service: Orthopedics;  Laterality: Left;  . WISDOM TOOTH EXTRACTION  1979   Family History  Problem Relation Age of Onset  . Hypertension Mother   . Arthritis Mother   . Hypertension Father   . Liver cancer Father   . Hypertension Sister   . Arthritis Maternal Aunt   . Hypertension Maternal Grandmother   . Kidney cancer Maternal Grandmother   . Alzheimer's disease Maternal Grandfather   . Colon cancer Maternal Grandfather 25  . Diabetes Maternal Grandfather   . Alzheimer's disease Paternal Grandmother   . Esophageal cancer Neg Hx   . Rectal cancer Neg Hx   . Stomach cancer Neg Hx    Allergies as of 04/13/2019   No Known Allergies     Medication List       Accurate as of April 13, 2019 10:03 AM. If you have any questions, ask your nurse or doctor.        Aloe 10000 & Probiotics Caps Take by mouth.   gabapentin 300 MG capsule Commonly known as: NEURONTIN 300 mg qday, 600  mg qhs   GLUCOS-CHONDROIT-CA-MG-C-D PO Take by mouth daily.   Green Tea 315 MG Caps Take by mouth daily.   ibuprofen 200 MG tablet Commonly known as: ADVIL Take 400 mg by mouth every 4 (four) hours as needed.   losartan 50 MG tablet Commonly known as: COZAAR Take 1 tablet (50 mg total) by mouth daily.   Multi-Vitamin Gummies Chew Chew 1 Units by mouth daily.   multivitamin Liqd Take 5 mLs by mouth daily.   NON FORMULARY   Omega 3 1000 MG Caps Take 1 capsule by mouth daily.   pravastatin 10 MG tablet Commonly known as: PRAVACHOL Take 1 tablet (10 mg total) by mouth daily.   tizanidine 2 MG capsule Commonly known as: ZANAFLEX Take 1 capsule (2 mg total) by mouth 3 (three) times daily as needed for  muscle spasms.   Vitamin D (Cholecalciferol) 25 MCG (1000 UT) Caps Take 2 tablets by mouth daily.       All past medical history, surgical history, allergies, family history, immunizations andmedications were updated in the EMR today and reviewed under the history and medication portions of their EMR.     ROS: Negative, with the exception of above mentioned in HPI   Objective:  BP 103/68 (BP Location: Right Arm, Patient Position: Sitting, Cuff Size: Normal)   Pulse 70   Temp (!) 97.2 F (36.2 C) (Temporal)   Resp 17   Ht 5' (1.524 m)   Wt 136 lb 8 oz (61.9 kg)   LMP  (LMP Unknown) Comment: LMP x 4 years  SpO2 97%   BMI 26.66 kg/m  Body mass index is 26.66 kg/m. Gen: Afebrile. No acute distress. Pleasant female. Overweight.  HENT: AT. [place].  Eyes:Pupils Equal Round Reactive to light, Extraocular movements intact,  Conjunctiva without redness, discharge or icterus. Neck/lymp/endocrine: Supple,no lymphadenopathy, no thyromegaly CV: RRR no murmur, no edema Chest: CTAB, no wheeze or crackles Abd: Sof. NTND. BS present. Neuro: Normal gait. PERLA. EOMi. Alert. Oriented x3. Psych: Normal affect, dress and demeanor. Normal speech. Normal thought content and judgment.   No exam data present No results found. No results found for this or any previous visit (from the past 24 hour(s)).  Assessment/Plan: [name] is a 58 y.o. female present for OV for  Essential hypertension/HLD/overweight - stable.  - refills provided on losartan 50 mg QD.  - labs UTD- due next visit. - ok to start baby ASA if desired as long as no increase in bruising with use and no h/o GI bleed.  - low salt diet, exercise.  - refills provided on statin.  - f/u 6 months (can be CPE of scheduled appropriately))  Vit d def:  - pt taking 2000u daily of vit d, last labs 06/2018 ~25 vit d level  Influenza immunization provided today    Reviewed expectations re: course of current medical issues.   Discussed self-management of symptoms.  Outlined signs and symptoms indicating need for more acute intervention.  Patient verbalized understanding and all questions were answered.  Patient received an After-Visit Summary.    Orders Placed This Encounter  Procedures  . Flu Vaccine QUAD 36+ mos IM     Note is dictated utilizing voice recognition software. Although note has been proof read prior to signing, occasional typographical errors still can be missed. If any questions arise, please do not hesitate to call for verification.   electronically signed by:  Howard Pouch, DO  [place]

## 2019-04-19 ENCOUNTER — Telehealth: Payer: Self-pay | Admitting: *Deleted

## 2019-05-12 ENCOUNTER — Other Ambulatory Visit: Payer: Self-pay

## 2019-05-12 ENCOUNTER — Ambulatory Visit
Payer: BLUE CROSS/BLUE SHIELD | Attending: Student in an Organized Health Care Education/Training Program | Admitting: Student in an Organized Health Care Education/Training Program

## 2019-05-12 ENCOUNTER — Encounter: Payer: Self-pay | Admitting: Student in an Organized Health Care Education/Training Program

## 2019-05-12 DIAGNOSIS — M47812 Spondylosis without myelopathy or radiculopathy, cervical region: Secondary | ICD-10-CM

## 2019-05-12 DIAGNOSIS — M7918 Myalgia, other site: Secondary | ICD-10-CM | POA: Diagnosis not present

## 2019-05-12 DIAGNOSIS — G8929 Other chronic pain: Secondary | ICD-10-CM

## 2019-05-12 DIAGNOSIS — M542 Cervicalgia: Secondary | ICD-10-CM | POA: Diagnosis not present

## 2019-05-12 DIAGNOSIS — M5412 Radiculopathy, cervical region: Secondary | ICD-10-CM

## 2019-05-12 DIAGNOSIS — G894 Chronic pain syndrome: Secondary | ICD-10-CM

## 2019-05-12 DIAGNOSIS — M545 Low back pain: Secondary | ICD-10-CM | POA: Diagnosis not present

## 2019-05-12 DIAGNOSIS — M4802 Spinal stenosis, cervical region: Secondary | ICD-10-CM

## 2019-05-12 MED ORDER — TIZANIDINE HCL 2 MG PO CAPS: 2.0000 mg | ORAL_CAPSULE | Freq: Every evening | ORAL | 5 refills | Status: DC | PRN

## 2019-05-12 MED ORDER — TRAMADOL HCL 50 MG PO TABS: 50.0000 mg | ORAL_TABLET | Freq: Two times a day (BID) | ORAL | 0 refills | Status: AC | PRN

## 2019-05-12 MED ORDER — GABAPENTIN 300 MG PO CAPS: 900.0000 mg | ORAL_CAPSULE | Freq: Every day | ORAL | 5 refills | Status: DC

## 2019-05-12 MED ORDER — TRAMADOL HCL 50 MG PO TABS: 50.0000 mg | ORAL_TABLET | Freq: Two times a day (BID) | ORAL | 0 refills | Status: DC | PRN

## 2019-05-12 NOTE — Progress Notes (Signed)
Pain Management Virtual Encounter Note - Virtual Visit via [place] (real-time audio visits between healthcare provider and patient).   Patient's Phone No. & Preferred Pharmacy:  [phone] (home); [phone] (mobile); (Preferred) [phone] [email or web address]  [facility] [id number] - [place], [place] AT [address] [place] [address] Phone: [phone] Fax: [phone]    Pre-screening note:  Our staff contacted Sarah Fisher and offered her an "in person", "face-to-face" appointment versus a telephone encounter. She indicated preferring the telephone encounter, at this time.   Reason for Virtual Visit: COVID-19*  Social distancing based on CDC and AMA recommendations.   I contacted Sarah Fisher on 05/12/2019 via video conference.      I clearly identified myself as Gillis Santa, MD. I verified that I was speaking with the correct person using two identifiers (Name: Sarah Fisher, and date of birth: February 16, 1961).  Advanced Informed Consent I sought verbal advanced consent from Sarah Fisher for virtual visit interactions. I informed Sarah Fisher of possible security and privacy concerns, risks, and limitations associated with providing "not-in-person" medical evaluation and management services. I also informed Sarah Fisher of the availability of "in-person" appointments. Finally, I informed her that there would be a charge for the virtual visit and that she could be  personally, fully or partially, financially responsible for it. Sarah Fisher expressed understanding and agreed to proceed.   Historic Elements   Sarah Fisher is a 58 y.o. year old, female patient evaluated today after her last encounter by our practice on 04/19/2019. Sarah Fisher  has a past medical history of Anemia, Arthritis, Cervical spine degeneration (2010), History of carpal tunnel syndrome, Hyperlipidemia, Hypertension,  Sacroiliac joint pain (2010), Scoliosis, and Skin cancer of eyelid. She also  has a past surgical history that includes Breast surgery (1986); Hand surgery (Bilateral, 2000 and 2016); cervical facet block (2010); Wisdom tooth extraction (1979); Mass excision (Left, 08/10/2017); I&D extremity (Left, 08/10/2017); and left finger surgery (Left, 08/2017). Sarah Fisher has a current medication list which includes the following prescription(s): gabapentin, glucos-chondroit-ca-mg-c-d, green tea, ibuprofen, losartan, multivitamin, multi-vitamin gummies, NON FORMULARY, omega 3, pravastatin, aloe 10000 & probiotics, tizanidine, tramadol, and vitamin d (cholecalciferol). She  reports that she has never smoked. She has never used smokeless tobacco. She reports that she does not drink alcohol or use drugs. Sarah Fisher has No Known Allergies.   HPI  Today, she is being contacted for medication management.   Continuing to have neck pain, utilizing Gabapentin 600 mg qhs.  Has not been taking daytime Gabapentin during daytime as she works. Utilizes Tizanidine at night which is helpful. Also utilizes Tramadol very sparingly when she has severe pain flare.  PMP checked and and appropriate.  Laboratory Chemistry Profile (12 mo)  Renal: 06/15/2018: BUN 21; Creatinine, Ser 0.63  Lab Results  Component Value Date   GFR 103.25 06/15/2018   GFRAA >60 10/01/2016   GFRNONAA >60 10/01/2016   Hepatic: 06/15/2018: Albumin 4.4 Lab Results  Component Value Date   AST 18 06/15/2018   ALT 14 06/15/2018   Other: 06/15/2018: VITD 24.45 Note: Above Lab results reviewed.  Assessment  The primary encounter diagnosis was Cervical myofascial pain syndrome. Diagnoses of Myofascial pain, Chronic low back pain (Bilateral) (L>R), Spondylosis of cervical region without myelopathy or radiculopathy, Cervical facet syndrome, Chronic neck pain (Location of Primary Source of Pain) (Bilateral) (L>R), Chronic cervical radiculopathy (Bilateral)  (L>R), Cervical central  spinal stenosis C5-6 (Right), and Chronic pain syndrome were also pertinent to this visit.  Plan of Care  I have changed Sarah Fisher's gabapentin and tizanidine. I am also having her maintain her ibuprofen, Green Tea, Omega 3, GLUCOS-CHONDROIT-CA-MG-C-D PO, multivitamin, Aloe 10000 & Probiotics, Multi-Vitamin Gummies, NON FORMULARY, Vitamin D (Cholecalciferol), losartan, pravastatin, and traMADol.  Pharmacotherapy (Medications Ordered): Meds ordered this encounter  Medications  . gabapentin (NEURONTIN) 300 MG capsule    Sig: Take 3 capsules (900 mg total) by mouth at bedtime.    Dispense:  90 capsule    Refill:  5  . tizanidine (ZANAFLEX) 2 MG capsule    Sig: Take 1-2 capsules (2-4 mg total) by mouth at bedtime as needed for muscle spasms.    Dispense:  60 capsule    Refill:  5    Do not place this medication, or any other prescription from our practice, on "Automatic Refill". Patient may have prescription filled one day early if pharmacy is closed on scheduled refill date.  Marland Kitchen DISCONTD: traMADol (ULTRAM) 50 MG tablet    Sig: Take 1-2 tablets (50-100 mg total) by mouth every 12 (twelve) hours as needed for severe pain.    Dispense:  30 tablet    Refill:  0    Fill one day early if pharmacy is closed on scheduled refill date. Do not fill until:  To last until:  . traMADol (ULTRAM) 50 MG tablet    Sig: Take 1-2 tablets (50-100 mg total) by mouth every 12 (twelve) hours as needed for severe pain.    Dispense:  30 tablet    Refill:  0    Fill one day early if pharmacy is closed on scheduled refill date.   Follow-up plan:   Return in about 6 months (around 11/09/2019) for Medication Management.    Recent Visits No visits were found meeting these conditions.  Showing recent visits within past 90 days and meeting all other requirements   Today's Visits Date Type Provider Dept  05/12/19 Office Visit Gillis Santa, MD [facility]  Showing today's  visits and meeting all other requirements   Future Appointments No visits were found meeting these conditions.  Showing future appointments within next 90 days and meeting all other requirements   I discussed the assessment and treatment plan with the patient. The patient was provided an opportunity to ask questions and all were answered. The patient agreed with the plan and demonstrated an understanding of the instructions.  Patient advised to call back or seek an in-person evaluation if the symptoms or condition worsens.  Total duration of non-face-to-face encounter: 25 minutes.  Note by: Gillis Santa, MD Date: 05/12/2019; Time: 3:25 PM  Note: This dictation was prepared with Dragon dictation. Any transcriptional errors that may result from this process are unintentional.  Disclaimer:  * Given the special circumstances of the COVID-19 pandemic, the federal government has announced that the Office for Civil Rights (OCR) will exercise its enforcement discretion and will not impose penalties on physicians using telehealth in the event of noncompliance with regulatory requirements under the [place] (HIPAA) in connection with the good faith provision of telehealth during the XX123456 national public health emergency. ([place])

## 2019-05-16 ENCOUNTER — Telehealth: Payer: Self-pay | Admitting: Student in an Organized Health Care Education/Training Program

## 2019-05-16 NOTE — Telephone Encounter (Signed)
PA was sent.  Patient notified.

## 2019-05-16 NOTE — Telephone Encounter (Signed)
Patient went to pick up her meds and was told the Tizanidine needs prior auth with her insurance before she can get it filled. Please let patient know status of thsi

## 2019-05-18 ENCOUNTER — Telehealth: Payer: Self-pay | Admitting: Student in an Organized Health Care Education/Training Program

## 2019-05-18 MED ORDER — TIZANIDINE HCL 4 MG PO TABS: 2.0000 mg | ORAL_TABLET | Freq: Every evening | ORAL | 5 refills | Status: AC | PRN

## 2019-05-18 NOTE — Telephone Encounter (Signed)
Dr Holley Raring, the insurance company will only cover the Tizanidine in tablets.  Can you please change the order to tablets instead of capsules so that she can get her medications?  Thank you!

## 2019-05-18 NOTE — Telephone Encounter (Signed)
I sent in tablets today

## 2019-05-18 NOTE — Telephone Encounter (Signed)
Patient called asking to be called back regarding medication problem  John from [employer] stating her tizanidine has been denied and did not leave a phone number.

## 2019-05-18 NOTE — Telephone Encounter (Deleted)
Patient Sarah Fisher called to say her Tizanidine is denied by Medical Director  Patient also called asking to speak with Nurse.

## 2019-05-18 NOTE — Addendum Note (Signed)
Addended by: Gillis Santa on: 05/18/2019 08:17 AM   Modules accepted: Orders

## 2019-08-02 IMAGING — MR MR CERVICAL SPINE W/O CM
5 series · 34 of 48 positions shown · non-contrast
Comparison: Radiographs 10/01/2016.  Cervical MRI 10/21/2007.

CLINICAL DATA: Recurring neck pain radiating into both shoulders
and arms for 14 years. Occasional tingling and numbness in both
hands. No acute injury or prior relevant surgery.

EXAM:
MRI CERVICAL SPINE WITHOUT CONTRAST
TECHNIQUE: Multiplanar, multisequence MR imaging of the cervical spine was
performed. No intravenous contrast was administered.

[Series 2: T2 · sagittal · 3.0mm · 0.56mm/px · 8 of 13 slices shown (1 of 2)]
[im 1/13]
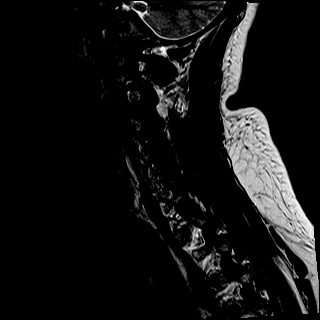
[im 2/13]
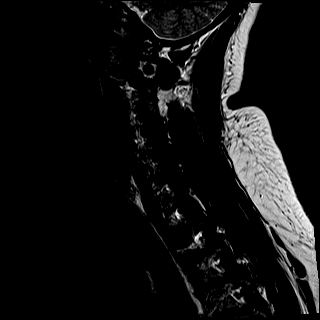
[im 4/13]
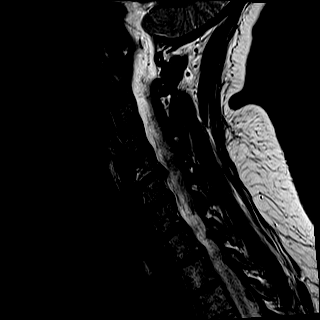
[im 6/13]
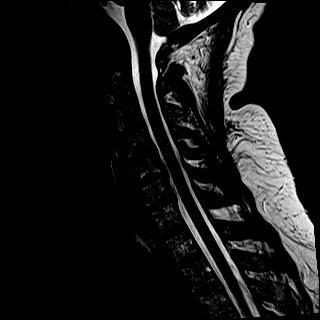
[im 7/13]
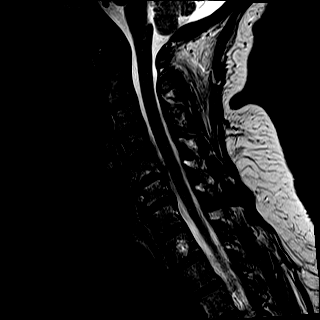
[im 9/13]
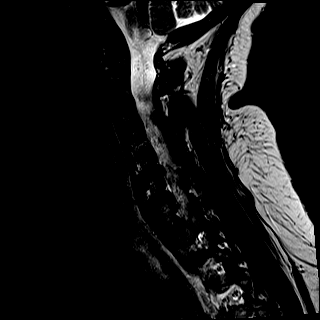
[im 11/13]
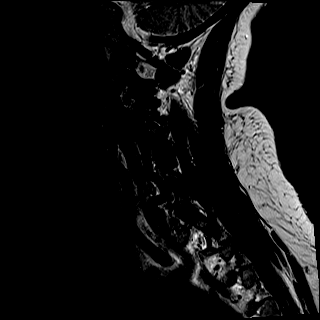
[im 13/13]
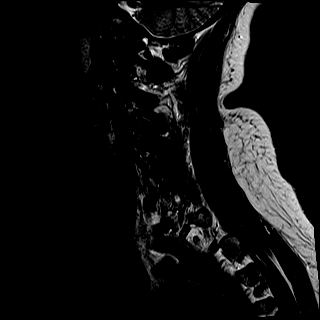

[Series 3: T1 · sagittal · 3.0mm · 0.70mm/px · 7 of 13 slices shown]
[im 1/13]
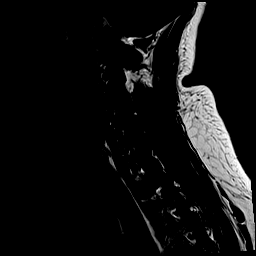
[im 3/13]
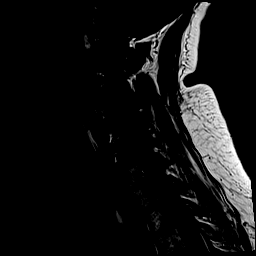
[im 5/13]
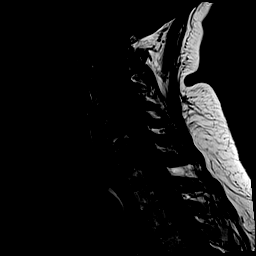
[im 7/13]
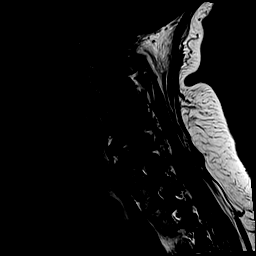
[im 9/13]
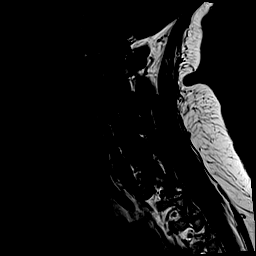
[im 11/13]
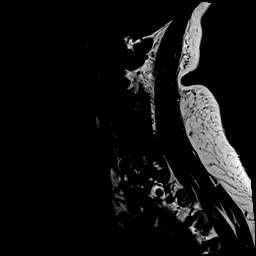
[im 13/13]
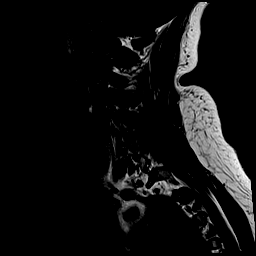

[Series 4: STIR · sagittal · 3.0mm · 0.35mm/px · 7 of 13 slices shown]
[im 1/13]
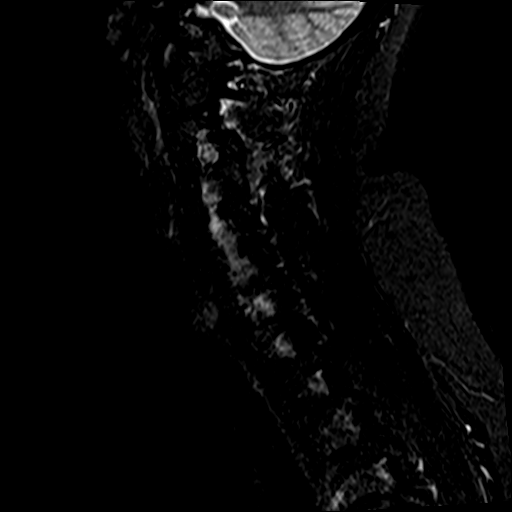
[im 3/13]
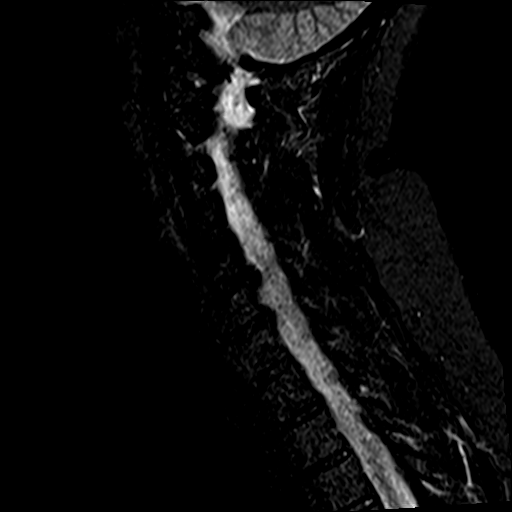
[im 5/13]
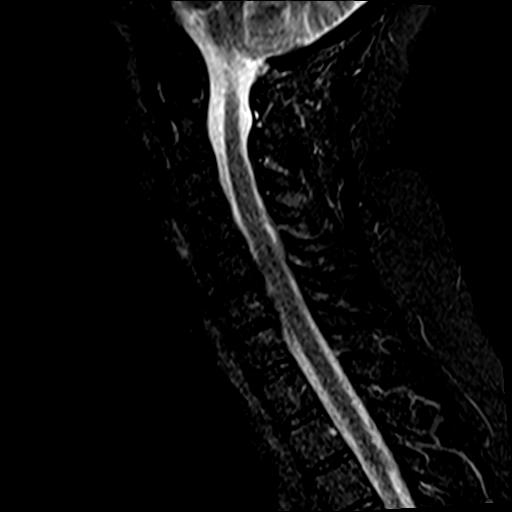
[im 7/13]
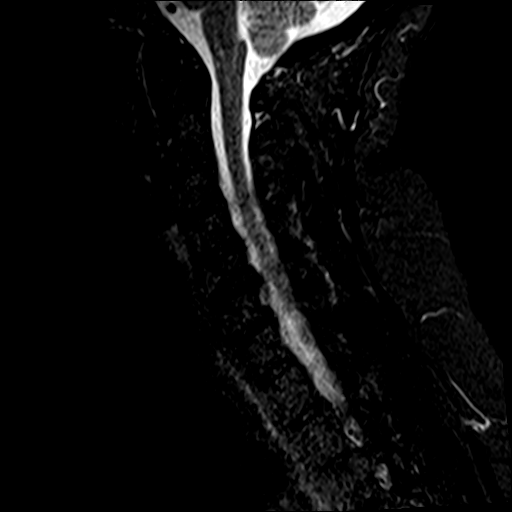
[im 9/13]
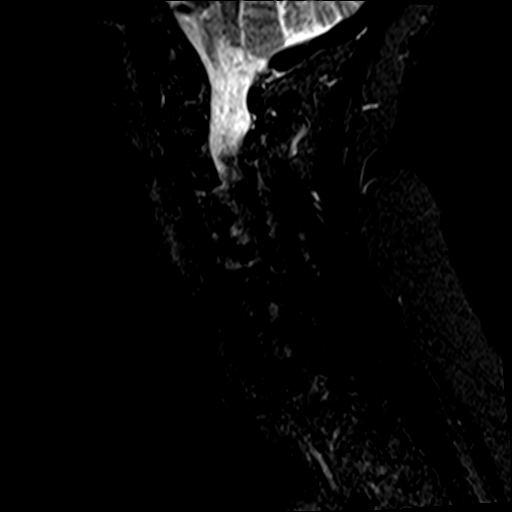
[im 11/13]
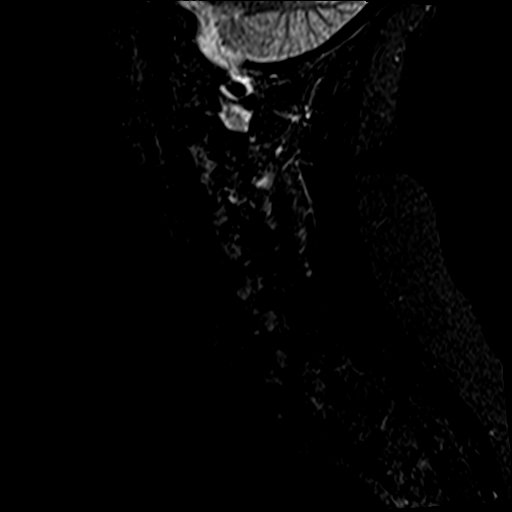
[im 13/13]
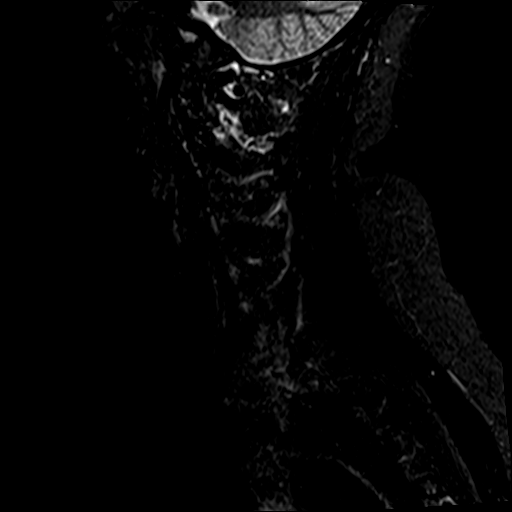

[Series 5: T2 · axial · 3.0mm · 0.62mm/px · z∈[-111,-24]mm · 9 of 25 slices shown (2 of 2)]
[im 1/25]
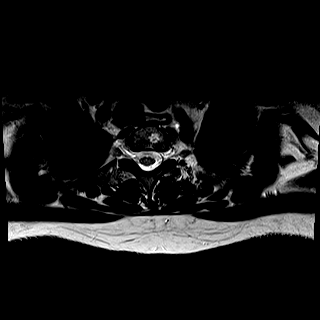
[im 5/25]
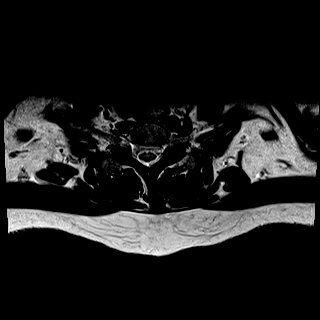
[im 9/25]
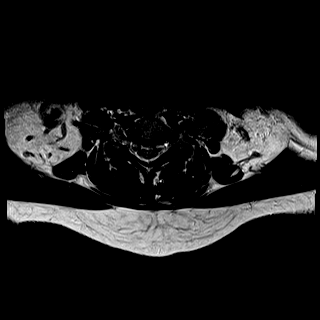
[im 11/25]
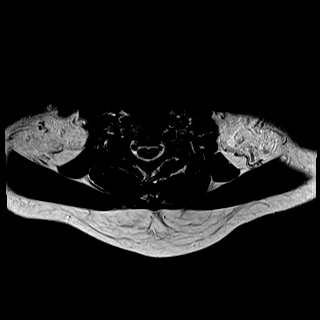
[im 13/25]
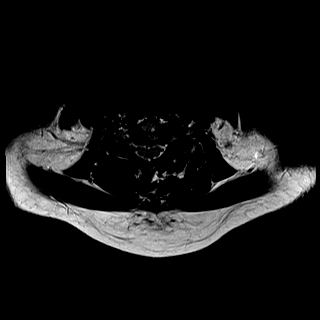
[im 15/25]
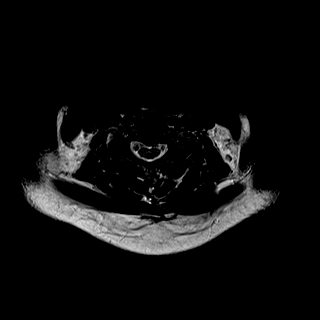
[im 17/25]
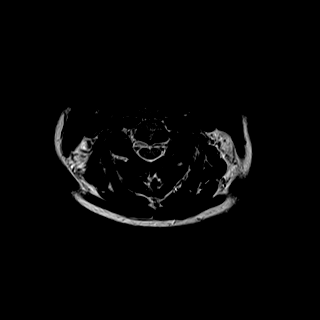
[im 21/25]
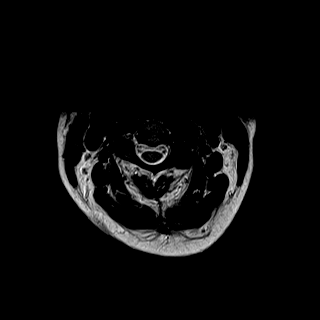
[im 25/25]
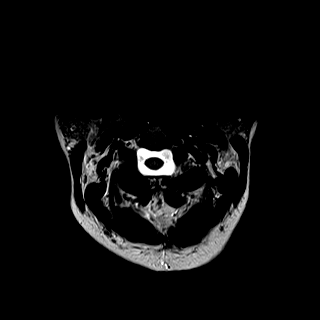

[Series 6: mpgr ax · axial · 3.0mm · 0.35mm/px · z∈[-99,-70]mm · 3 of 25 slices shown]
[im 1/25]
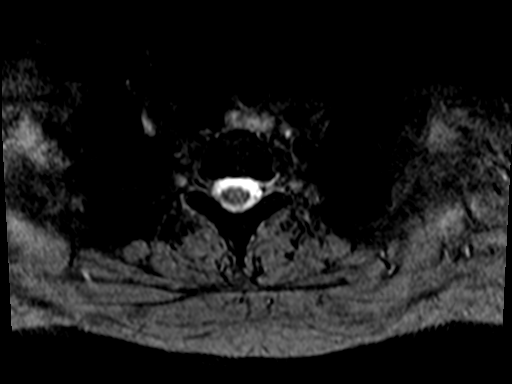
[im 5/25]
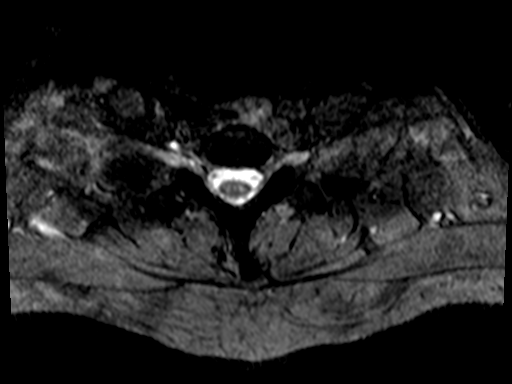
[im 9/25]
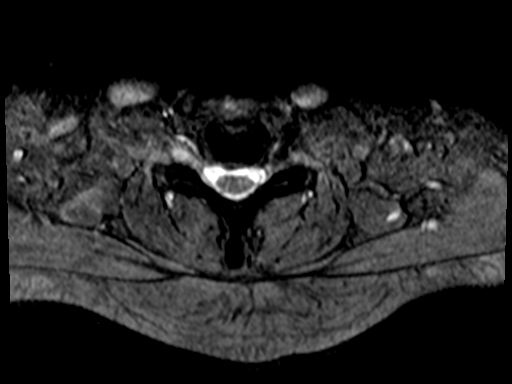

[34 of 48 positions shown; findings below may reference images not displayed]

FINDINGS: Alignment: Straightening without angulation or listhesis.

Vertebrae: No acute or suspicious osseous findings.

Cord: Normal in signal and caliber.

Posterior Fossa, vertebral arteries, paraspinal tissues: Visualized
portions of the posterior fossa and paraspinal soft tissues appear
unremarkable. Bilateral vertebral artery flow voids. The right
vertebral artery is small, but appears unchanged.

Disc levels:

C2-3: The disc appears normal. Asymmetric facet hypertrophy on the
left without resulting spinal stenosis.

C3-4: The disc appears normal. There is left-greater-than-right
facet hypertrophy without significant foraminal compromise.

C4-5: The disc appears normal. Mild bilateral facet hypertrophy. No
spinal stenosis or nerve root encroachment.

C5-6: Mildly progressive loss of disc height with endplate
osteophytes covering diffusely bulging disc material. The AP
diameter of the canal is 9 mm. There is no cord deformity. There is
a left foraminal disc osteophyte complex which may contribute to
left C6 nerve root encroachment. The right foramen appears only
mildly narrowed.

C6-7: Mild disc bulging and uncinate spurring. The left vertebral
artery is tortuous, extending into the left foramen. No cord
deformity.

C7-T1: The disc appears normal. Mild bilateral facet hypertrophy. No
spinal stenosis or nerve root encroachment.
IMPRESSION: 1. Progressive spondylosis at C5-6 compared with previous MRI from
0666. Left foraminal disc osteophyte complex may contribute to left
C6 nerve root encroachment.
2. Dominant left vertebral artery is tortuous, extending into left
foramen at C6-7 (and to a lesser degree at C5-6). This could
contribute to left-sided nerve root encroachment.
3. Mild spinal stenosis at C5-6 without cord deformity. No abnormal
cord signal.

## 2019-09-10 ENCOUNTER — Ambulatory Visit: Payer: BLUE CROSS/BLUE SHIELD | Attending: Internal Medicine

## 2019-09-10 DIAGNOSIS — Z23 Encounter for immunization: Secondary | ICD-10-CM | POA: Insufficient documentation

## 2019-09-10 NOTE — Progress Notes (Signed)
   Covid-19 Vaccination Clinic  Name:  Sarah Fisher    MRN: [id number] DOB: [birth year]  09/10/2019  [name] was observed post Covid-19 immunization for 15 minutes without incident. She was provided with Vaccine Information Sheet and instruction to access the V-Safe system.   [name] was instructed to call 911 with any severe reactions post vaccine: Marland Kitchen Difficulty breathing  . Swelling of face and throat  . A fast heartbeat  . A bad rash all over body  . Dizziness and weakness   Immunizations Administered    Name Date Dose VIS Date Route   Pfizer COVID-19 Vaccine 09/10/2019  5:08 PM 0.3 mL 06/17/2019 Intramuscular   Manufacturer: [place]   Lot: [id number]   [place]: [id number]

## 2019-09-14 DIAGNOSIS — H6121 Impacted cerumen, right ear: Secondary | ICD-10-CM | POA: Diagnosis not present

## 2019-09-14 DIAGNOSIS — H9311 Tinnitus, right ear: Secondary | ICD-10-CM | POA: Diagnosis not present

## 2019-09-14 DIAGNOSIS — M542 Cervicalgia: Secondary | ICD-10-CM | POA: Diagnosis not present

## 2019-10-11 ENCOUNTER — Ambulatory Visit: Payer: BLUE CROSS/BLUE SHIELD | Attending: Internal Medicine

## 2019-10-11 DIAGNOSIS — Z23 Encounter for immunization: Secondary | ICD-10-CM

## 2019-10-11 NOTE — Progress Notes (Signed)
   Covid-19 Vaccination Clinic  Name:  Sarah Fisher    MRN: [id number] DOB: 1961/02/15  10/11/2019  [name] was observed post Covid-19 immunization for 15 minutes without incident. She was provided with Vaccine Information Sheet and instruction to access the V-Safe system.   [name] was instructed to call 911 with any severe reactions post vaccine: Marland Kitchen Difficulty breathing  . Swelling of face and throat  . A fast heartbeat  . A bad rash all over body  . Dizziness and weakness   Immunizations Administered    Name Date Dose VIS Date Route   Pfizer COVID-19 Vaccine 10/11/2019  3:33 PM 0.3 mL 06/17/2019 Intramuscular   Manufacturer: [place]   Lot: [id number]   [place]: [id number]

## 2019-11-07 ENCOUNTER — Other Ambulatory Visit: Payer: Self-pay | Admitting: Pain Medicine

## 2019-11-07 ENCOUNTER — Other Ambulatory Visit: Payer: Self-pay | Admitting: Student in an Organized Health Care Education/Training Program

## 2019-11-07 DIAGNOSIS — M792 Neuralgia and neuritis, unspecified: Secondary | ICD-10-CM

## 2019-11-07 DIAGNOSIS — G894 Chronic pain syndrome: Secondary | ICD-10-CM

## 2019-11-07 MED ORDER — GABAPENTIN 300 MG PO CAPS: 900.0000 mg | ORAL_CAPSULE | Freq: Every day | ORAL | 5 refills | Status: DC

## 2019-11-15 DIAGNOSIS — Z20828 Contact with and (suspected) exposure to other viral communicable diseases: Secondary | ICD-10-CM | POA: Diagnosis not present

## 2019-12-05 DIAGNOSIS — H10501 Unspecified blepharoconjunctivitis, right eye: Secondary | ICD-10-CM | POA: Diagnosis not present

## 2019-12-21 DIAGNOSIS — Z1389 Encounter for screening for other disorder: Secondary | ICD-10-CM | POA: Diagnosis not present

## 2019-12-21 DIAGNOSIS — I10 Essential (primary) hypertension: Secondary | ICD-10-CM | POA: Diagnosis not present

## 2019-12-21 DIAGNOSIS — M0600 Rheumatoid arthritis without rheumatoid factor, unspecified site: Secondary | ICD-10-CM | POA: Diagnosis not present

## 2019-12-21 DIAGNOSIS — G894 Chronic pain syndrome: Secondary | ICD-10-CM | POA: Diagnosis not present

## 2019-12-21 DIAGNOSIS — E7849 Other hyperlipidemia: Secondary | ICD-10-CM | POA: Diagnosis not present

## 2020-01-05 DIAGNOSIS — H00021 Hordeolum internum right upper eyelid: Secondary | ICD-10-CM | POA: Diagnosis not present

## 2020-02-09 DIAGNOSIS — Z20828 Contact with and (suspected) exposure to other viral communicable diseases: Secondary | ICD-10-CM | POA: Diagnosis not present

## 2020-03-29 DIAGNOSIS — Z01419 Encounter for gynecological examination (general) (routine) without abnormal findings: Secondary | ICD-10-CM | POA: Diagnosis not present

## 2020-03-29 DIAGNOSIS — L290 Pruritus ani: Secondary | ICD-10-CM | POA: Diagnosis not present

## 2020-03-29 DIAGNOSIS — N9411 Superficial (introital) dyspareunia: Secondary | ICD-10-CM | POA: Diagnosis not present

## 2020-03-29 DIAGNOSIS — Z1151 Encounter for screening for human papillomavirus (HPV): Secondary | ICD-10-CM | POA: Diagnosis not present

## 2020-03-29 LAB — HM PAP SMEAR

## 2020-03-29 LAB — RESULTS CONSOLE HPV: CHL HPV: NEGATIVE

## 2020-05-02 ENCOUNTER — Other Ambulatory Visit: Payer: Self-pay | Admitting: Pain Medicine

## 2020-05-02 ENCOUNTER — Other Ambulatory Visit (HOSPITAL_BASED_OUTPATIENT_CLINIC_OR_DEPARTMENT_OTHER): Payer: BLUE CROSS/BLUE SHIELD | Admitting: Pain Medicine

## 2020-05-02 DIAGNOSIS — M67912 Unspecified disorder of synovium and tendon, left shoulder: Secondary | ICD-10-CM

## 2020-05-02 DIAGNOSIS — M792 Neuralgia and neuritis, unspecified: Secondary | ICD-10-CM

## 2020-05-02 DIAGNOSIS — M7532 Calcific tendinitis of left shoulder: Secondary | ICD-10-CM

## 2020-05-02 DIAGNOSIS — M25511 Pain in right shoulder: Secondary | ICD-10-CM

## 2020-05-02 DIAGNOSIS — G8929 Other chronic pain: Secondary | ICD-10-CM

## 2020-05-02 DIAGNOSIS — M25512 Pain in left shoulder: Secondary | ICD-10-CM

## 2020-05-02 DIAGNOSIS — M545 Low back pain, unspecified: Secondary | ICD-10-CM

## 2020-05-02 DIAGNOSIS — G894 Chronic pain syndrome: Secondary | ICD-10-CM

## 2020-05-02 DIAGNOSIS — M542 Cervicalgia: Secondary | ICD-10-CM

## 2020-05-02 DIAGNOSIS — M5030 Other cervical disc degeneration, unspecified cervical region: Secondary | ICD-10-CM

## 2020-05-02 DIAGNOSIS — G4486 Cervicogenic headache: Secondary | ICD-10-CM

## 2020-05-02 DIAGNOSIS — M5412 Radiculopathy, cervical region: Secondary | ICD-10-CM

## 2020-05-02 DIAGNOSIS — M06019 Rheumatoid arthritis without rheumatoid factor, unspecified shoulder: Secondary | ICD-10-CM

## 2020-05-02 DIAGNOSIS — M4802 Spinal stenosis, cervical region: Secondary | ICD-10-CM

## 2020-05-02 MED ORDER — GABAPENTIN 300 MG PO CAPS: 900.0000 mg | ORAL_CAPSULE | Freq: Three times a day (TID) | ORAL | 11 refills | Status: DC

## 2020-05-02 MED ORDER — PREDNISONE 20 MG PO TABS: ORAL_TABLET | ORAL | 0 refills | Status: AC

## 2020-05-02 NOTE — Progress Notes (Signed)
Note: This is not documentation for an encounter.  This is a summary of the case created for the purpose of providing information for other physicians to home the patient has been referred to.  There should be no charge for this review.  HPI summary  Sarah Fisher, a 59 y.o. year old adult,with a history of the following problems: Chronic pain syndrome secondary to Osteoarthritis of cervical spine; Cervical DDD (degenerative disc disease) (C5-6); Cervical central spinal stenosis C5-6 (Right); Chronic neck pain (Bilateral) (L>R); Cervicogenic headache (Bilateral) (R>L); Occipital headache (Bilateral) (R>L); Neurogenic pain; Myofascial pain; Chronic shoulder pain (Bilateral) (L>R); Chronic upper back pain (Bilateral) (L>R); Chronic low back pain (Bilateral) (L>R); Cervical spondylosis; Numbness of upper extremity (Bilateral) (L>R); Chronic cervical radiculopathy (Bilateral) (L>R); Chronic knee pain (Bilateral) (R>L); Osteoarthritis of knee (Bilateral) (R>L); Rheumatoid arthritis, seronegative, shoulder region (Bilateral) (L>R); Calcific Tendinopathy of Shoulder (Left); Calcific tendinitis of shoulder (Left); and Acute pain of left shoulder. Pain Assessment: Severity 10/10.  Medication Review  Aloe 10000 & Probiotics, Glucos-Chondroit-Ca-Mg-C-D, Green Tea, Multi-Vitamin Gummies, NON FORMULARY, Omega 3, Vitamin D (Cholecalciferol), gabapentin, ibuprofen, losartan, multivitamin, pravastatin, and predniSONE  History Review  Allergy: Ms. Aja has No Known Allergies. Drug: Ms. Brendlinger  reports no history of drug use. Alcohol:  reports no history of alcohol use. Tobacco:  reports that she has never smoked. She has never used smokeless tobacco. Social: Ms. Betsch  reports that she has never smoked. She has never used smokeless tobacco. She reports that she does not drink alcohol and does not use drugs. Medical:  has a past medical history of Anemia, Arthritis, Cervical spine degeneration (2010),  History of carpal tunnel syndrome, Hyperlipidemia, Hypertension, Sacroiliac joint pain (2010), Scoliosis, and Skin cancer of eyelid. Surgical: Ms. Harrigan  has a past surgical history that includes Breast surgery (1986); Hand surgery (Bilateral, 2000 and 2016); cervical facet block (2010); Wisdom tooth extraction (1979); Mass excision (Left, 08/10/2017); I & D extremity (Left, 08/10/2017); and left finger surgery (Left, 08/2017). Family: family history includes Alzheimer's disease in her maternal grandfather and paternal grandmother; Arthritis in her maternal aunt and mother; Colon cancer (age of onset: 53) in her maternal grandfather; Diabetes in her maternal grandfather; Hypertension in her father, maternal grandmother, mother, and sister; Kidney cancer in her maternal grandmother; Liver cancer in her father.  Laboratory Chemistry Profile   Renal Lab Results  Component Value Date   BUN 21 06/15/2018   CREATININE 0.63 06/15/2018   GFR 103.25 06/15/2018   GFRAA >60 10/01/2016   GFRNONAA >60 10/01/2016     Hepatic Lab Results  Component Value Date   AST 18 06/15/2018   ALT 14 06/15/2018   ALBUMIN 4.4 06/15/2018   ALKPHOS 55 06/15/2018   HCVAB NEGATIVE 05/15/2016     Electrolytes Lab Results  Component Value Date   NA 141 06/15/2018   K 3.8 06/15/2018   CL 107 06/15/2018   CALCIUM 9.3 06/15/2018   MG 2.2 10/01/2016     Bone Lab Results  Component Value Date   VD25OH 24.45 (L) 06/15/2018   25OHVITD1 40 10/01/2016   25OHVITD2 18 10/01/2016   25OHVITD3 22 10/01/2016     Inflammation (CRP: Acute Phase) (ESR: Chronic Phase) Lab Results  Component Value Date   CRP <0.8 10/01/2016   ESRSEDRATE 21 10/01/2016       Note: Above Lab results reviewed. Imaging Review  Cervical Imaging: Cervical MR wo contrast: Results for orders placed in visit on 07/06/17 MR [place]  Narrative CLINICAL DATA:  Recurring neck pain radiating into both shoulders and arms for 14  years. Occasional tingling and numbness in both hands. No acute injury or prior relevant surgery.  EXAM: MRI CERVICAL SPINE WITHOUT CONTRAST  TECHNIQUE: Multiplanar, multisequence MR imaging of the cervical spine was performed. No intravenous contrast was administered.  COMPARISON:  Radiographs 10/01/2016.  Cervical MRI 10/21/2007.  FINDINGS: Alignment: Straightening without angulation or listhesis.  Vertebrae: No acute or suspicious osseous findings.  Cord: Normal in signal and caliber.  Posterior Fossa, vertebral arteries, paraspinal tissues: Visualized portions of the posterior fossa and paraspinal soft tissues appear unremarkable. Bilateral vertebral artery flow voids. The right vertebral artery is small, but appears unchanged.  Disc levels:  C2-3: The disc appears normal. Asymmetric facet hypertrophy on the left without resulting spinal stenosis.  C3-4: The disc appears normal. There is left-greater-than-right facet hypertrophy without significant foraminal compromise.  C4-5: The disc appears normal. Mild bilateral facet hypertrophy. No spinal stenosis or nerve root encroachment.  C5-6: Mildly progressive loss of disc height with endplate osteophytes covering diffusely bulging disc material. The AP diameter of the canal is 9 mm. There is no cord deformity. There is a left foraminal disc osteophyte complex which may contribute to left C6 nerve root encroachment. The right foramen appears only mildly narrowed.  C6-7: Mild disc bulging and uncinate spurring. The left vertebral artery is tortuous, extending into the left foramen. No cord deformity.  C7-T1: The disc appears normal. Mild bilateral facet hypertrophy. No spinal stenosis or nerve root encroachment.  IMPRESSION: 1. Progressive spondylosis at C5-6 compared with previous MRI from 2009. Left foraminal disc osteophyte complex may contribute to left C6 nerve root encroachment. 2. Dominant left vertebral  artery is tortuous, extending into left foramen at C6-7 (and to a lesser degree at C5-6). This could contribute to left-sided nerve root encroachment. 3. Mild spinal stenosis at C5-6 without cord deformity. No abnormal cord signal.   Electronically Signed By: Richardean Sale M.D. On: 07/06/2017 13:59  Cervical DG complete: Results for orders placed during the hospital encounter of 10/01/16 DG Cervical Spine Complete  Narrative CLINICAL DATA:  Cervical spasms from many years radiating to the shoulder and arm  EXAM: CERVICAL SPINE - COMPLETE 4+ VIEW  COMPARISON:  MR C-spine of 10/21/2007  FINDINGS: The cervical vertebrae are in normal alignment. There is degenerative disc disease primarily at C5-6 where there is loss of disc space and sclerosis with spurring. The remainder of intervertebral disc spaces appear normal. No prevertebral soft tissue swelling is seen. On oblique views, there is only mild foraminal narrowing bilaterally at C5-6. The odontoid process is intact. The lung apices are clear.  IMPRESSION: 1. Normal alignment. 2. Degenerative disc disease at C5-6 where there is mild foraminal narrowing bilaterally.   Electronically Signed By: Ivar Drape M.D. On: 10/01/2016 14:57  Shoulder Imaging: Shoulder-R DG: Results for orders placed during the hospital encounter of 10/01/16 DG Shoulder Right  Narrative CLINICAL DATA:  Shoulder pain, no trauma  EXAM: RIGHT SHOULDER - 2+ VIEW  COMPARISON:  None.  FINDINGS: The right humeral head is in normal position and the glenohumeral joint space appears normal. There is calcification lying over the lateral aspect of the humeral head which could represent calcific tendinitis. The right [facility] joint is normally aligned.  IMPRESSION: 1. No acute abnormality. 2. Calcification could indicate chronic calcific tendinitis.   Electronically Signed By: Ivar Drape M.D. On: 10/01/2016 15:01  Shoulder-L DG: Results for  orders placed  during the hospital encounter of 10/01/16 DG Shoulder Left  Narrative CLINICAL DATA:  Shoulder pain, no trauma  EXAM: LEFT SHOULDER - 2+ VIEW  COMPARISON:  None.  FINDINGS: The left humeral head is in normal position. The left glenohumeral joint space appears normal. The left AC joint is normally aligned.  IMPRESSION: Negative.   Electronically Signed By: Ivar Drape M.D. On: 10/01/2016 15:00  Lumbosacral Imaging: Lumbar DG Bending views: Results for orders placed during the hospital encounter of 10/01/16 DG Lumbar Spine Complete W/Bend  Narrative CLINICAL DATA:  Low back pain, no trauma  EXAM: LUMBAR SPINE - COMPLETE WITH BENDING VIEWS  COMPARISON:  None.  FINDINGS: The lumbar vertebrae are in normal alignment. Intervertebral disc spaces appear normal. No compression deformity is seen. On oblique views no significant degenerative change is noted involving the facet joints. Through flexion and extension there is relatively normal range of motion with no malalignment.  IMPRESSION: 1. Normal alignment with normal intervertebral disc spaces. 2. Normal range of motion through flexion and extension.   Electronically Signed By: Ivar Drape M.D. On: 10/01/2016 14:59        Knee Imaging: Knee-R DG 1-2 views: Results for orders placed during the hospital encounter of 10/01/16 DG Knee 1-2 Views Right  Narrative CLINICAL DATA:  Bilateral knee pain, no trauma  EXAM: RIGHT KNEE - 1-2 VIEW  COMPARISON:  None.  FINDINGS: Views of the right knee show well preserved knee joint spaces. No fracture is seen. No significant degenerative joint disease is noted. No joint effusion is seen.  IMPRESSION: Negative.   Electronically Signed By: Ivar Drape M.D. On: 10/01/2016 14:58  Knee-L DG 1-2 views: Results for orders placed during the hospital encounter of 10/01/16 DG Knee 1-2 Views Left  Narrative CLINICAL DATA:  Bilateral knee pain, no  injury  EXAM: LEFT KNEE - 1-2 VIEW  COMPARISON:  None.  FINDINGS: The left knee joint spaces appear normal. No significant degenerative joint disease is seen. No joint effusion is noted.  IMPRESSION: Negative.   Electronically Signed By: Ivar Drape M.D. On: 10/01/2016 14:58  Complexity Note: Imaging results reviewed.                        Physical Exam  She appears to have severe decreased range of motion of the left shoulder with exquisite tenderness to palpation over the deltoid muscle.  Assessment   Status Diagnosis  Having a Flare-up Deteriorating Deteriorating 1. Acute pain of left shoulder   2. Calcific tendinitis of shoulder (Left)   3. Calcific Tendinopathy of Shoulder (Left)      Updated Problems: Problem  Calcific Tendinopathy of Shoulder (Left)  Calcific tendinitis of shoulder (Left)  Acute Pain of Left Shoulder  Chronic neck pain (Bilateral) (L>R)  Chronic shoulder pain (Bilateral) (L>R)  Chronic upper back pain (Bilateral) (L>R)   Plan of Care  Referral to the [facility] for management of her chronic pain syndrome.  She recently switched insurance to the [place].  Referral to orthopedic surgery for evaluation of shoulder pain and possible excision of shoulder tendon calcifications.  Note by: Gaspar Cola, MD Date: 05/02/2020; Time: 4:20 PM

## 2020-05-03 LAB — VITAMIN D 25 HYDROXY (VIT D DEFICIENCY, FRACTURES): Vit D, 25-Hydroxy: 30

## 2020-05-04 DIAGNOSIS — Z86010 Personal history of colonic polyps: Secondary | ICD-10-CM | POA: Diagnosis not present

## 2020-05-04 DIAGNOSIS — E785 Hyperlipidemia, unspecified: Secondary | ICD-10-CM | POA: Diagnosis not present

## 2020-05-04 DIAGNOSIS — I10 Essential (primary) hypertension: Secondary | ICD-10-CM | POA: Diagnosis not present

## 2020-05-04 DIAGNOSIS — E559 Vitamin D deficiency, unspecified: Secondary | ICD-10-CM | POA: Diagnosis not present

## 2020-05-04 DIAGNOSIS — Z0000 Encounter for general adult medical examination without abnormal findings: Secondary | ICD-10-CM | POA: Diagnosis not present

## 2020-05-04 LAB — CBC AND DIFFERENTIAL
HCT: 33 — AB (ref 39–52)
Hemoglobin: 11.5 — AB (ref 13.0–17.0)
Platelets: 316 (ref 150–399)
WBC: 8.2

## 2020-05-04 LAB — BASIC METABOLIC PANEL
BUN: 20 (ref 4–21)
CO2: 27 — AB (ref 13–22)
Chloride: 107 (ref 99–108)
Creatinine: 0.7
Glucose: 91
Potassium: 4.3 (ref 3.4–5.3)
Sodium: 142 (ref 137–147)

## 2020-05-04 LAB — COMPREHENSIVE METABOLIC PANEL
Albumin: 4.3 (ref 3.5–5.0)
Calcium: 9.8 (ref 8.7–10.7)
GFR calc Af Amer: 90
GFR calc non Af Amer: 90

## 2020-05-04 LAB — LIPID PANEL
Cholesterol: 206 — AB (ref 0–200)
HDL: 77 — AB (ref 35–70)
LDL Cholesterol: 105
LDl/HDL Ratio: 2.7
Triglycerides: 93 (ref 40–160)

## 2020-05-04 LAB — HEPATIC FUNCTION PANEL
ALT: 23
AST: 20
Alkaline Phosphatase: 52 (ref 25–125)
Bilirubin, Total: 0.4

## 2020-05-04 LAB — CBC: RBC: 3.75 — AB (ref 3.87–5.11)

## 2020-05-04 LAB — FECAL OCCULT BLOOD, GUAIAC: Fecal Occult Blood: NEGATIVE

## 2020-05-15 DIAGNOSIS — Z23 Encounter for immunization: Secondary | ICD-10-CM | POA: Diagnosis not present

## 2020-05-15 DIAGNOSIS — Z0000 Encounter for general adult medical examination without abnormal findings: Secondary | ICD-10-CM | POA: Diagnosis not present

## 2020-05-15 DIAGNOSIS — I10 Essential (primary) hypertension: Secondary | ICD-10-CM | POA: Diagnosis not present

## 2020-05-15 DIAGNOSIS — E785 Hyperlipidemia, unspecified: Secondary | ICD-10-CM | POA: Diagnosis not present

## 2020-05-25 DIAGNOSIS — Z1231 Encounter for screening mammogram for malignant neoplasm of breast: Secondary | ICD-10-CM | POA: Diagnosis not present

## 2020-05-25 LAB — HM MAMMOGRAPHY

## 2020-09-03 ENCOUNTER — Other Ambulatory Visit: Payer: Self-pay

## 2020-09-04 ENCOUNTER — Ambulatory Visit (INDEPENDENT_AMBULATORY_CARE_PROVIDER_SITE_OTHER): Payer: BC Managed Care – PPO | Admitting: Family Medicine

## 2020-09-04 ENCOUNTER — Encounter: Payer: Self-pay | Admitting: Family Medicine

## 2020-09-04 VITALS — BP 126/81 | HR 60 | Temp 97.5°F | Ht 60.0 in | Wt 135.0 lb

## 2020-09-04 DIAGNOSIS — Z23 Encounter for immunization: Secondary | ICD-10-CM | POA: Diagnosis not present

## 2020-09-04 DIAGNOSIS — E559 Vitamin D deficiency, unspecified: Secondary | ICD-10-CM

## 2020-09-04 DIAGNOSIS — E785 Hyperlipidemia, unspecified: Secondary | ICD-10-CM | POA: Diagnosis not present

## 2020-09-04 DIAGNOSIS — I10 Essential (primary) hypertension: Secondary | ICD-10-CM | POA: Diagnosis not present

## 2020-09-04 DIAGNOSIS — Z131 Encounter for screening for diabetes mellitus: Secondary | ICD-10-CM

## 2020-09-04 DIAGNOSIS — D126 Benign neoplasm of colon, unspecified: Secondary | ICD-10-CM

## 2020-09-04 DIAGNOSIS — M06019 Rheumatoid arthritis without rheumatoid factor, unspecified shoulder: Secondary | ICD-10-CM

## 2020-09-04 DIAGNOSIS — E663 Overweight: Secondary | ICD-10-CM | POA: Diagnosis not present

## 2020-09-04 DIAGNOSIS — Z0000 Encounter for general adult medical examination without abnormal findings: Secondary | ICD-10-CM

## 2020-09-04 DIAGNOSIS — Z1231 Encounter for screening mammogram for malignant neoplasm of breast: Secondary | ICD-10-CM

## 2020-09-04 MED ORDER — LOSARTAN POTASSIUM 50 MG PO TABS: 50.0000 mg | ORAL_TABLET | Freq: Every day | ORAL | 1 refills | Status: DC

## 2020-09-04 MED ORDER — PRAVASTATIN SODIUM 10 MG PO TABS: 10.0000 mg | ORAL_TABLET | Freq: Every day | ORAL | 3 refills | Status: DC

## 2020-09-04 NOTE — Patient Instructions (Signed)
Health Maintenance, Female Adopting a healthy lifestyle and getting preventive care are important in promoting health and wellness. Ask your health care provider about:  The right schedule for you to have regular tests and exams.  Things you can do on your own to prevent diseases and keep yourself healthy. What should I know about diet, weight, and exercise? Eat a healthy diet  Eat a diet that includes plenty of vegetables, fruits, low-fat dairy products, and lean protein.  Do not eat a lot of foods that are high in solid fats, added sugars, or sodium.   Maintain a healthy weight Body mass index (BMI) is used to identify weight problems. It estimates body fat based on height and weight. Your health care provider can help determine your BMI and help you achieve or maintain a healthy weight. Get regular exercise Get regular exercise. This is one of the most important things you can do for your health. Most adults should:  Exercise for at least 150 minutes each week. The exercise should increase your heart rate and make you sweat (moderate-intensity exercise).  Do strengthening exercises at least twice a week. This is in addition to the moderate-intensity exercise.  Spend less time sitting. Even light physical activity can be beneficial. Watch cholesterol and blood lipids Have your blood tested for lipids and cholesterol at 60 years of age, then have this test every 5 years. Have your cholesterol levels checked more often if:  Your lipid or cholesterol levels are high.  You are older than 60 years of age.  You are at high risk for heart disease. What should I know about cancer screening? Depending on your health history and family history, you may need to have cancer screening at various ages. This may include screening for:  Breast cancer.  Cervical cancer.  Colorectal cancer.  Skin cancer.  Lung cancer. What should I know about heart disease, diabetes, and high blood  pressure? Blood pressure and heart disease  High blood pressure causes heart disease and increases the risk of stroke. This is more likely to develop in people who have high blood pressure readings, are of African descent, or are overweight.  Have your blood pressure checked: ? Every 3-5 years if you are 18-39 years of age. ? Every year if you are 40 years old or older. Diabetes Have regular diabetes screenings. This checks your fasting blood sugar level. Have the screening done:  Once every three years after age 40 if you are at a normal weight and have a low risk for diabetes.  More often and at a younger age if you are overweight or have a high risk for diabetes. What should I know about preventing infection? Hepatitis B If you have a higher risk for hepatitis B, you should be screened for this virus. Talk with your health care provider to find out if you are at risk for hepatitis B infection. Hepatitis C Testing is recommended for:  Everyone born from 1945 through 1965.  Anyone with known risk factors for hepatitis C. Sexually transmitted infections (STIs)  Get screened for STIs, including gonorrhea and chlamydia, if: ? You are sexually active and are younger than 60 years of age. ? You are older than 60 years of age and your health care provider tells you that you are at risk for this type of infection. ? Your sexual activity has changed since you were last screened, and you are at increased risk for chlamydia or gonorrhea. Ask your health care provider   if you are at risk.  Ask your health care provider about whether you are at high risk for HIV. Your health care provider may recommend a prescription medicine to help prevent HIV infection. If you choose to take medicine to prevent HIV, you should first get tested for HIV. You should then be tested every 3 months for as long as you are taking the medicine. Pregnancy  If you are about to stop having your period (premenopausal) and  you may become pregnant, seek counseling before you get pregnant.  Take 400 to 800 micrograms (mcg) of folic acid every day if you become pregnant.  Ask for birth control (contraception) if you want to prevent pregnancy. Osteoporosis and menopause Osteoporosis is a disease in which the bones lose minerals and strength with aging. This can result in bone fractures. If you are 65 years old or older, or if you are at risk for osteoporosis and fractures, ask your health care provider if you should:  Be screened for bone loss.  Take a calcium or vitamin D supplement to lower your risk of fractures.  Be given hormone replacement therapy (HRT) to treat symptoms of menopause. Follow these instructions at home: Lifestyle  Do not use any products that contain nicotine or tobacco, such as cigarettes, e-cigarettes, and chewing tobacco. If you need help quitting, ask your health care provider.  Do not use street drugs.  Do not share needles.  Ask your health care provider for help if you need support or information about quitting drugs. Alcohol use  Do not drink alcohol if: ? Your health care provider tells you not to drink. ? You are pregnant, may be pregnant, or are planning to become pregnant.  If you drink alcohol: ? Limit how much you use to 0-1 drink a day. ? Limit intake if you are breastfeeding.  Be aware of how much alcohol is in your drink. In the [place] one drink equals one 12 oz bottle of beer (355 mL), one 5 oz glass of wine (148 mL), or one 1 oz glass of hard liquor (44 mL). General instructions  Schedule regular health, dental, and eye exams.  Stay current with your vaccines.  Tell your health care provider if: ? You often feel depressed. ? You have ever been abused or do not feel safe at home. Summary  Adopting a healthy lifestyle and getting preventive care are important in promoting health and wellness.  Follow your health care provider's instructions about healthy  diet, exercising, and getting tested or screened for diseases.  Follow your health care provider's instructions on monitoring your cholesterol and blood pressure. This information is not intended to replace advice given to you by your health care provider. Make sure you discuss any questions you have with your health care provider. Document Revised: 06/16/2018 Document Reviewed: 06/16/2018 [employer]vier Patient Education  2021 [employer]  

## 2020-09-04 NOTE — Progress Notes (Signed)
This visit occurred during the SARS-CoV-2 public health emergency.  Safety protocols were in place, including screening questions prior to the visit, additional usage of staff PPE, and extensive cleaning of exam room while observing appropriate contact time as indicated for disinfecting solutions.    Patient ID: Sarah Fisher, female  DOB: 23-May-1961, 60 y.o.   MRN: [id number] Patient Care Team    Relationship Specialty Notifications Start End  Ma Hillock, DO PCP - General Family Medicine  04/14/16   Mauri Pole, MD Consulting Physician Gastroenterology  06/10/17   Milinda Pointer, MD Referring Physician Pain Medicine  06/10/17   Kassie Mends, MD Referring Physician Orthopedic Surgery  09/04/20     Chief Complaint  Patient presents with  . Annual Exam    Pt is not fasting     Subjective: Sarah Fisher is a 60 y.o.  Female  present for CPE/CMC re-est. All past medical history, surgical history, allergies, family history, immunizations, medications and social history were updated in the electronic medical record today. All recent labs, ED visits and hospitalizations within the last year were reviewed.  Patient is reestablishing today. She had seen other PCP over the last year secondary to insurance changes.  Health maintenance:  Colonoscopy: fhx presentin [facility]. 08/01/2016 Dr. Silverio Decamp, 5 yr follow up Mammogram: completed: 05/[facility] [place] Cervical cancer screening: last pap: 2019, results: normal per pt. [place] 2016 with submucous and intramural myomas 12.2 and 9.2 mm. 5 yr Immunizations: tdap UTD 12/2014, InfluenzaUTD 2021(encouraged yearly), covid x3, shingrix series started today. (nurse visit in 3 mos for #2) Infectious disease screening: HIV and HEP ccompleted DEXA: never, routine screen. Assistive device: none Oxygen FHL:KTGY Patient has a Dental home. Hospitalizations/ED visits:  reviewed  Hypertension/hyperlipidemia/overweight: Pt reports compliance withLosartan 50 mg QD. Patient denies chest pain, shortness of breath, dizziness or lower extremity edema.  Pt is prescribed statin. Diet:low sodium- eating healthy- watching her weight Exercise:walking about a mile a day. RF:HLD, HTN, overweight  Depression screen [facility] 2/9 09/04/2020 06/15/2018 04/05/2018 03/16/2018 02/09/2018  Decreased Interest 0 0 0 0 0  Down, Depressed, Hopeless 0 0 0 0 0  PHQ - 2 Score 0 0 0 0 0   No flowsheet data found.   Immunization History  Administered Date(s) Administered  . Influenza Inj Mdck Quad Pf 04/15/2018  . Influenza,inj,Quad PF,6+ Mos 04/14/2016, 06/03/2017, 04/13/2019  . Influenza-Unspecified 06/07/2020  . MMR 05/18/2018  . PFIZER(Purple Top)SARS-COV-2 Vaccination 09/10/2019, 10/11/2019, 06/07/2020  . Tdap 12/06/2014  . Zoster Recombinat (Shingrix) 09/04/2020    Past Medical History:  Diagnosis Date  . Anemia   . Arthritis   . Cervical spine degeneration 2010   C5-6  . History of carpal tunnel syndrome   . Hyperlipidemia   . Hypertension   . Sacroiliac joint pain 2010   right  . Scoliosis   . Skin cancer of eyelid    No Known Allergies Past Surgical History:  Procedure Laterality Date  . BREAST SURGERY  1986   reduction  . cervical facet block  2010  . HAND SURGERY Bilateral 2000 and 2016   benign tumor removal   . I & D EXTREMITY Left 08/10/2017   Procedure: DEBRIDEMENT DISTAL INTERPHALANGEAL JOINT;  Surgeon: Leanora Cover, MD;  Location: [facility];  Service: Orthopedics;  Laterality: Left;  . left finger surgery Left 08/2017  . MASS EXCISION Left 08/10/2017   Procedure: LEFT LONG FINGER EXCISION MASS;  Surgeon: Leanora Cover,  MD;  Location: [facility];  Service: Orthopedics;  Laterality: Left;  . WISDOM TOOTH EXTRACTION  1979   Family History  Problem Relation Age of Onset  . Hypertension Mother   . Arthritis Mother   .  Hypertension Father   . Liver cancer Father   . Hypertension Sister   . Arthritis Maternal Aunt   . Hypertension Maternal Grandmother   . Kidney cancer Maternal Grandmother   . Alzheimer's disease Maternal Grandfather   . Colon cancer Maternal Grandfather 63  . Diabetes Maternal Grandfather   . Alzheimer's disease Paternal Grandmother   . Esophageal cancer Neg Hx   . Rectal cancer Neg Hx   . Stomach cancer Neg Hx    Social History   Social History Narrative   Divorced. 3 children, one lives with [name]).   She is from [place], moved in 2016.    Housewife. B.A. Degree.    Drinks caffeine, uses herbal remedies. Takes a daily vitamin.    Wears her seatbelt, bicycle helmet.Smoke detector in the home.    Exercises routinely.    Feels safe in her relationships.     Allergies as of 09/04/2020   No Known Allergies     Medication List       Accurate as of September 04, 2020  5:11 PM. If you have any questions, ask your nurse or doctor.        STOP taking these medications   Aloe 10000 & Probiotics Caps Stopped by: Howard Pouch, DO   GLUCOS-CHONDROIT-CA-MG-C-D PO Stopped by: Howard Pouch, DO   Green Tea 315 MG Caps Stopped by: Howard Pouch, DO   Vitamin D (Cholecalciferol) 25 MCG (1000 UT) Caps Stopped by: Howard Pouch, DO     TAKE these medications   estradiol 0.1 MG/GM vaginal cream Commonly known as: ESTRACE Place 0.5 g vaginally 2 (two) times a week.   gabapentin 300 MG capsule Commonly known as: NEURONTIN Take 3 capsules (900 mg total) by mouth 3 (three) times daily.   ibuprofen 200 MG tablet Commonly known as: ADVIL Take 400 mg by mouth every 4 (four) hours as needed.   losartan 50 MG tablet Commonly known as: COZAAR Take 1 tablet (50 mg total) by mouth daily.   Multi-Vitamin Gummies Chew Chew 1 Units by mouth daily.   multivitamin Liqd Take 5 mLs by mouth daily.   NON FORMULARY   Omega 3 1000 MG Caps Take 1 capsule by mouth daily.    pravastatin 10 MG tablet Commonly known as: PRAVACHOL Take 1 tablet (10 mg total) by mouth daily.   triamcinolone 0.025 % ointment Commonly known as: KENALOG Apply topically 2 (two) times daily as needed.   UNABLE TO FIND Neuriva       All past medical history, surgical history, allergies, family history, immunizations andmedications were updated in the EMR today and reviewed under the history and medication portions of their EMR.     No results found for this or any previous visit (from the past 2160 hour(s)).  No results found.   ROS: 14 pt review of systems performed and negative (unless mentioned in an HPI)  Objective: BP 126/81   Pulse 60   Temp (!) 97.5 F (36.4 C) (Oral)   Ht 5' (1.524 m)   Wt 135 lb (61.2 kg)   LMP  (LMP Unknown) Comment: LMP x 4 years  SpO2 97%   BMI 26.37 kg/m  Gen: Afebrile. No acute distress. Nontoxic in appearance,  well-developed, well-nourished, pleasant female HENT: AT. [place]. Bilateral TM visualized and normal in appearance, normal external auditory canal. MMM, no oral lesions, adequate dentition. Bilateral nares within normal limits. Throat without erythema, ulcerations or exudates. No cough on exam, no hoarseness on exam. Eyes:Pupils Equal Round Reactive to light, Extraocular movements intact,  Conjunctiva without redness, discharge or icterus. Neck/lymp/endocrine: Supple, no lymphadenopathy, no thyromegaly CV: RRR no murmur, no edema, +2/4 P posterior tibialis pulses.  Chest: CTAB, no wheeze, rhonchi or crackles. Normal respiratory effort. Good air movement. Abd: Soft. Flat. NTND. BS present. No masses palpated. No hepatosplenomegaly. No rebound tenderness or guarding. Skin: No rashes, purpura or petechiae. Warm and well-perfused. Skin intact. Neuro/Msk:  Normal gait. PERLA. EOMi. Alert. Oriented x3.  Cranial nerves II through XII intact. Muscle strength 5/5 upper/lower extremity. DTRs equal bilaterally. Psych: Normal affect, dress and  demeanor. Normal speech. Normal thought content and judgment.   No exam data present  Assessment/plan: BRINKLEY PEET is a 60 y.o. female present for re-est CPE/CMC Essential hypertension/HLD/overweight - stable - continue  losartan 50 mg QD.  - ok to start baby ASA if desired as long as no increase in bruising with use and no h/o GI bleed.  - low salt diet, exercise.  - refills provided on statin.  -CBC, BMP, TSH collected today. Lipid up-to-date and normal Continue pravastatin 10 mg daily - f/u 5.5 mos  Vit d def:  - pt taking 2000u daily of vit d, last labs 06/2018 ~25 vit d level - vit d collected today - VITAMIN D 25 Hydroxy (Vit-D Deficiency, Fractures)  Adenomatous polyp of colon, unspecified part of colon Colonoscopy up-to-date, due 2023  Diabetes mellitus screening - Hemoglobin A1c Rheumatoid arthritis, seronegative, shoulder region (Bilateral) (L>R) She has been having more rheumatoid arthritis flares I would like to establish with rheumatology - Ambulatory referral to Rheumatology  Breast cancer screening by mammogram - MM 3D SCREEN BREAST BILATERAL; Future  Routine general medical examination at a health care facility Patient was encouraged to exercise greater than 150 minutes a week. Patient was encouraged to choose a diet filled with fresh fruits and vegetables, and lean meats. AVS provided to patient today for education/recommendation on gender specific health and safety maintenance. Colonoscopy: fhx presentin [facility]. 08/01/2016 Dr. Silverio Decamp, 5 yr follow up Mammogram: completed: 05/2020- [facility] Cervical cancer screening: last pap: 2019, results: normal per pt. [place] 2016 with submucous and intramural myomas 12.2 and 9.2 mm. 5 yr Immunizations: tdap UTD 12/2014, InfluenzaUTD 2021(encouraged yearly), covid x3, shingrix series started today. (nurse visit in 3 mos for #2) Infectious disease screening: HIV and HEP ccompleted DEXA: never, routine  screen.  Return in about 25 weeks (around 02/26/2021) for [place] (30 min) and 1 yr cpe.  3 months-nurse visit for Shingrix No. 2  Orders Placed This Encounter  Procedures  . HM MAMMOGRAPHY  . MM 3D SCREEN BREAST BILATERAL  . Varicella-zoster vaccine IM  . CBC with Differential/Platelet  . Comprehensive metabolic panel  . Hemoglobin A1c  . TSH  . VITAMIN D 25 Hydroxy (Vit-D Deficiency, Fractures)  . Ambulatory referral to Rheumatology    Meds ordered this encounter  Medications  . losartan (COZAAR) 50 MG tablet    Sig: Take 1 tablet (50 mg total) by mouth daily.    Dispense:  90 tablet    Refill:  1    Needs follow up appointment for further refills.  . pravastatin (PRAVACHOL) 10 MG tablet  Sig: Take 1 tablet (10 mg total) by mouth daily.    Dispense:  90 tablet    Refill:  3    Referral Orders     Ambulatory referral to Rheumatology   Electronically signed by: Howard Pouch, [place]

## 2020-09-05 ENCOUNTER — Telehealth: Payer: Self-pay | Admitting: Family Medicine

## 2020-09-05 LAB — HEMOGLOBIN A1C
Hgb A1c MFr Bld: 5.2 % of total Hgb (ref <5.7)
Mean Plasma Glucose: 103 mg/dL
eAG (mmol/L): 5.7 mmol/L

## 2020-09-05 LAB — CBC WITH DIFFERENTIAL/PLATELET
Absolute Monocytes: 461 cells/uL (ref 200–950)
Basophils Absolute: 48 cells/uL (ref 0–200)
Basophils Relative: 0.9 %
Eosinophils Absolute: 180 cells/uL (ref 15–500)
Eosinophils Relative: 3.4 %
HCT: 31.3 % — ABNORMAL LOW (ref 35.0–45.0)
Hemoglobin: 10.5 g/dL — ABNORMAL LOW (ref 11.7–15.5)
Lymphs Abs: 1648 cells/uL (ref 850–3900)
MCH: 30.2 pg (ref 27.0–33.0)
MCHC: 33.5 g/dL (ref 32.0–36.0)
MCV: 89.9 fL (ref 80.0–100.0)
MPV: 9.6 fL (ref 7.5–12.5)
Monocytes Relative: 8.7 %
Neutro Abs: 2963 cells/uL (ref 1500–7800)
Neutrophils Relative %: 55.9 %
Platelets: 326 10*3/uL (ref 140–400)
RBC: 3.48 10*6/uL — ABNORMAL LOW (ref 3.80–5.10)
RDW: 12.1 % (ref 11.0–15.0)
Total Lymphocyte: 31.1 %
WBC: 5.3 10*3/uL (ref 3.8–10.8)

## 2020-09-05 LAB — COMPREHENSIVE METABOLIC PANEL
AG Ratio: 1.9 (calc) (ref 1.0–2.5)
ALT: 17 U/L (ref 6–29)
AST: 21 U/L (ref 10–35)
Albumin: 4.2 g/dL (ref 3.6–5.1)
Alkaline phosphatase (APISO): 53 U/L (ref 37–153)
BUN: 18 mg/dL (ref 7–25)
CO2: 26 mmol/L (ref 20–32)
Calcium: 9.1 mg/dL (ref 8.6–10.4)
Chloride: 106 mmol/L (ref 98–110)
Creat: 0.72 mg/dL (ref 0.50–1.05)
Globulin: 2.2 g/dL (calc) (ref 1.9–3.7)
Glucose, Bld: 85 mg/dL (ref 65–99)
Potassium: 4.4 mmol/L (ref 3.5–5.3)
Sodium: 140 mmol/L (ref 135–146)
Total Bilirubin: 0.4 mg/dL (ref 0.2–1.2)
Total Protein: 6.4 g/dL (ref 6.1–8.1)

## 2020-09-05 LAB — VITAMIN D 25 HYDROXY (VIT D DEFICIENCY, FRACTURES): Vit D, 25-Hydroxy: 22 ng/mL — ABNORMAL LOW (ref 30–100)

## 2020-09-05 LAB — TSH: TSH: 1.4 mIU/L (ref 0.40–4.50)

## 2020-09-05 NOTE — Telephone Encounter (Signed)
Please call patient -Liver, kidney and thyroid function are normal -Diabetes screening/A1c is normal  - Her vitamin D is also low at 22.  I would encourage her to increase her vitamin D supplement by 1000 units daily. - Blood cell counts indicate new onset anemia with a hemoglobin of 10.5 and hematocrit of 31.3.  In the past she has had normal hemoglobin.   -This does need further evaluation for causes of new onset anemia.  Please schedule her for follow-up on her anemia at her earliest convenience and 2-4 weeks.

## 2020-09-05 NOTE — Telephone Encounter (Signed)
LVM for pt to CB regarding results.  

## 2020-09-05 NOTE — Telephone Encounter (Signed)
Spoke with patient to advise of results and recommendations. 3 week provider follow up appointment scheduled. She mentioned not currently taking vitamin D and would like to clarify if 1000 units daily is enough.   Please advise, thanks.

## 2020-09-05 NOTE — Telephone Encounter (Signed)
Only time will tell if 1000 units will be enough.  I would encourage her to start at 1000 units daily and while we see her on follow-up we will recheck.

## 2020-09-06 DIAGNOSIS — M5030 Other cervical disc degeneration, unspecified cervical region: Secondary | ICD-10-CM | POA: Diagnosis not present

## 2020-09-06 DIAGNOSIS — M7531 Calcific tendinitis of right shoulder: Secondary | ICD-10-CM | POA: Insufficient documentation

## 2020-09-06 NOTE — Telephone Encounter (Signed)
Spoke with patient to advise of recommendations. Voiced understanding

## 2020-09-25 ENCOUNTER — Ambulatory Visit: Payer: BC Managed Care – PPO | Admitting: Family Medicine

## 2020-10-02 ENCOUNTER — Ambulatory Visit: Payer: BC Managed Care – PPO | Admitting: Family Medicine

## 2020-10-04 ENCOUNTER — Ambulatory Visit: Payer: BC Managed Care – PPO | Admitting: Family Medicine

## 2020-10-04 ENCOUNTER — Other Ambulatory Visit: Payer: Self-pay

## 2020-10-04 ENCOUNTER — Encounter: Payer: Self-pay | Admitting: Family Medicine

## 2020-10-04 VITALS — BP 109/72 | HR 70 | Temp 98.0°F | Ht 60.0 in | Wt 134.0 lb

## 2020-10-04 DIAGNOSIS — M5030 Other cervical disc degeneration, unspecified cervical region: Secondary | ICD-10-CM | POA: Diagnosis not present

## 2020-10-04 DIAGNOSIS — D649 Anemia, unspecified: Secondary | ICD-10-CM

## 2020-10-04 DIAGNOSIS — M5412 Radiculopathy, cervical region: Secondary | ICD-10-CM | POA: Diagnosis not present

## 2020-10-04 DIAGNOSIS — M7531 Calcific tendinitis of right shoulder: Secondary | ICD-10-CM | POA: Diagnosis not present

## 2020-10-04 DIAGNOSIS — R21 Rash and other nonspecific skin eruption: Secondary | ICD-10-CM

## 2020-10-04 LAB — CBC
HCT: 34.2 % — ABNORMAL LOW (ref 36.0–46.0)
Hemoglobin: 11.6 g/dL — ABNORMAL LOW (ref 12.0–15.0)
MCHC: 33.9 g/dL (ref 30.0–36.0)
MCV: 89.6 fl (ref 78.0–100.0)
Platelets: 264 10*3/uL (ref 150.0–400.0)
RBC: 3.81 Mil/uL — ABNORMAL LOW (ref 3.87–5.11)
RDW: 13.5 % (ref 11.5–15.5)
WBC: 4.7 10*3/uL (ref 4.0–10.5)

## 2020-10-04 MED ORDER — TRIAMCINOLONE ACETONIDE 0.5 % EX OINT: 1.0000 "application " | TOPICAL_OINTMENT | Freq: Two times a day (BID) | CUTANEOUS | 5 refills | Status: DC

## 2020-10-04 NOTE — Progress Notes (Signed)
This visit occurred during the SARS-CoV-2 public health emergency.  Safety protocols were in place, including screening questions prior to the visit, additional usage of staff PPE, and extensive cleaning of exam room while observing appropriate contact time as indicated for disinfecting solutions.    Sarah Fisher , 1961-02-10, 60 y.o., adult MRN: [id number] Patient Care Team    Relationship Specialty Notifications Start End  Sarah Hillock, DO PCP - General Family Medicine  04/14/16   Sarah Pole, MD Consulting Physician Gastroenterology  06/10/17   Sarah Pointer, MD Referring Physician Pain Medicine  06/10/17   Sarah Mends, MD Referring Physician Orthopedic Surgery  09/04/20     Chief Complaint  Patient presents with  . Anemia    PMHx of anemia with heavy menses;      Subjective: Pt presents for an OV with complaints to follow-up on findings of anemia on recent labs.  She denies any worsening fatigue, palpitations, or dizziness.  She has not had any vaginal bleeding.  She denies notable blood per rectum.  Last colonoscopy was 2018 with repeat due this January 2023, with Dr. Silverio Fisher.  She is prescribed chronic NSAIDs for her arthritic discomfort.  Patient also has noticed a rash on her bilateral elbows over the last few weeks.  She has no history of skin conditions such as eczema or psoriasis.  She reports the rash is very itchy.  CBC Latest Ref Rng & Units 09/04/2020 05/04/2020 06/15/2018  WBC 3.8 - 10.8 Thousand/uL 5.3 8.2 4.9  Hemoglobin 11.7 - 15.5 g/dL 10.5(L) 11.5(A) 12.2  Hematocrit 35.0 - 45.0 % 31.3(L) 33(A) 35.8(L)  Platelets 140 - 400 Thousand/uL 326 316 292.0     Depression screen [facility] 2/9 09/04/2020 06/15/2018 04/05/2018 03/16/2018 02/09/2018  Decreased Interest 0 0 0 0 0  Down, Depressed, Hopeless 0 0 0 0 0  PHQ - 2 Score 0 0 0 0 0    No Known Allergies Social History   Social History Narrative   Divorced. 3 children, one lives with [name]).    She is from [place], moved in 2016.    Housewife. B.A. Degree.    Drinks caffeine, uses herbal remedies. Takes a daily vitamin.    Wears her seatbelt, bicycle helmet.Smoke detector in the home.    Exercises routinely.    Feels safe in her relationships.    Past Medical History:  Diagnosis Date  . Anemia   . Arthritis   . Cervical spine degeneration 2010   C5-6  . History of carpal tunnel syndrome   . Hyperlipidemia   . Hypertension   . Sacroiliac joint pain 2010   right  . Scoliosis   . Skin cancer of eyelid    Past Surgical History:  Procedure Laterality Date  . BREAST SURGERY  1986   reduction  . cervical facet block  2010  . HAND SURGERY Bilateral 2000 and 2016   benign tumor removal   . I & D EXTREMITY Left 08/10/2017   Procedure: DEBRIDEMENT DISTAL INTERPHALANGEAL JOINT;  Surgeon: Leanora Cover, MD;  Location: [facility];  Service: Orthopedics;  Laterality: Left;  . left finger surgery Left 08/2017  . MASS EXCISION Left 08/10/2017   Procedure: LEFT LONG FINGER EXCISION MASS;  Surgeon: Leanora Cover, MD;  Location: [facility];  Service: Orthopedics;  Laterality: Left;  . WISDOM TOOTH EXTRACTION  1979   Family History  Problem Relation Age of Onset  . Hypertension Mother   .  Arthritis Mother   . Hypertension Father   . Liver cancer Father   . Hypertension Sister   . Arthritis Maternal Aunt   . Hypertension Maternal Grandmother   . Kidney cancer Maternal Grandmother   . Alzheimer's disease Maternal Grandfather   . Colon cancer Maternal Grandfather 59  . Diabetes Maternal Grandfather   . Alzheimer's disease Paternal Grandmother   . Esophageal cancer Neg Hx   . Rectal cancer Neg Hx   . Stomach cancer Neg Hx    Allergies as of 10/04/2020   No Known Allergies     Medication List       Accurate as of October 04, 2020 10:09 AM. If you have any questions, ask your nurse or doctor.        estradiol 0.1 MG/GM vaginal cream Commonly  known as: ESTRACE Place 0.5 g vaginally 2 (two) times a week.   gabapentin 300 MG capsule Commonly known as: NEURONTIN Take 3 capsules (900 mg total) by mouth 3 (three) times daily.   ibuprofen 200 MG tablet Commonly known as: ADVIL Take 400 mg by mouth every 4 (four) hours as needed.   losartan 50 MG tablet Commonly known as: COZAAR Take 1 tablet (50 mg total) by mouth daily.   meloxicam 15 MG tablet Commonly known as: MOBIC Take 15 mg by mouth daily.   Multi-Vitamin Gummies Chew Chew 1 Units by mouth daily.   multivitamin Liqd Take 5 mLs by mouth daily.   NON FORMULARY   Omega 3 1000 MG Caps Take 1 capsule by mouth daily.   pravastatin 10 MG tablet Commonly known as: PRAVACHOL Take 1 tablet (10 mg total) by mouth daily.   triamcinolone 0.025 % ointment Commonly known as: KENALOG Apply topically 2 (two) times daily as needed.   UNABLE TO FIND Neuriva       All past medical history, surgical history, allergies, family history, immunizations andmedications were updated in the EMR today and reviewed under the history and medication portions of their EMR.     ROS: Negative, with the exception of above mentioned in HPI   Objective:  BP 109/72   Pulse 70   Temp 98 F (36.7 C) (Oral)   Ht 5' (1.524 m)   Wt 134 lb (60.8 kg)   LMP  (LMP Unknown) Comment: LMP x 4 years  SpO2 97%   BMI 26.17 kg/m  Body mass index is 26.17 kg/m. Gen: Afebrile. No acute distress. Nontoxic in appearance, well developed, well nourished.  HENT: AT. [place].  Eyes:Pupils Equal Round Reactive to light, Extraocular movements intact,  Conjunctiva without redness, discharge or icterus. CV: RRR no murmur, no edema Chest: CTAB, no wheeze or crackles. Good air movement, normal resp effort.  Skin: Right elbow with erythemic plaque Neuro:  Normal gait. PERLA. EOMi. Alert. Oriented x3 Psych: Normal affect, dress and demeanor. Normal speech. Normal thought content and judgment.  No exam data  present No results found. No results found for this or any previous visit (from the past 24 hour(s)).  Assessment/Plan: Sarah Fisher is a 60 y.o. adult present for OV for  Anemia, unspecified type Unknown cause of her anemia.  She is on chronic NSAIDs which could be causing ulceration or small GI bleed over time.  We discussed her CBC trend since 2018 has slowly shown decrease in hemoglobin/hematocrit to now abnormal hemoglobin/hematocrit.  We will collect labs today and send patient home to complete Hemoccult cards. - CBC - Iron, TIBC and Ferritin Panel -  POC Hemoccult Bld/Stl (3-Cd Home Screen); Future We discussed early referral back to GI for further evaluation his CBC declined in and/or FOBT cards are positive.  Rash Rash appears eczema-like in nature.  She does not have a history of eczema.  Worse on the right elbow. Triamcinolone cream prescribed twice daily and patient was provided with instructions on eczema, showering, or over-the-counter creams/balm's that can be applied. Follow-up as needed   Reviewed expectations re: course of current medical issues.  Discussed self-management of symptoms.  Outlined signs and symptoms indicating need for more acute intervention.  Patient verbalized understanding and all questions were answered.  Patient received an After-Visit Summary.    No orders of the defined types were placed in this encounter.  No orders of the defined types were placed in this encounter.  Referral Orders  No referral(s) requested today     Note is dictated utilizing voice recognition software. Although note has been proof read prior to signing, occasional typographical errors still can be missed. If any questions arise, please do not hesitate to call for verification.   electronically signed by:  Howard Pouch, DO  [place]

## 2020-10-04 NOTE — Patient Instructions (Signed)

## 2020-10-05 LAB — IRON,TIBC AND FERRITIN PANEL
%SAT: 21 % (calc) (ref 16–45)
Ferritin: 8 ng/mL — ABNORMAL LOW (ref 16–232)
Iron: 104 ug/dL (ref 45–160)
TIBC: 484 mcg/dL (calc) — ABNORMAL HIGH (ref 250–450)

## 2020-10-17 NOTE — Addendum Note (Signed)
Addended by: Octaviano Glow on: 10/17/2020 01:26 PM   Modules accepted: Orders

## 2020-10-18 ENCOUNTER — Encounter: Payer: Self-pay | Admitting: Internal Medicine

## 2020-10-18 ENCOUNTER — Ambulatory Visit (INDEPENDENT_AMBULATORY_CARE_PROVIDER_SITE_OTHER): Payer: BC Managed Care – PPO | Admitting: Internal Medicine

## 2020-10-18 ENCOUNTER — Other Ambulatory Visit: Payer: Self-pay

## 2020-10-18 VITALS — BP 133/92 | HR 70 | Ht 60.0 in | Wt 133.4 lb

## 2020-10-18 DIAGNOSIS — M7532 Calcific tendinitis of left shoulder: Secondary | ICD-10-CM | POA: Diagnosis not present

## 2020-10-18 DIAGNOSIS — M06019 Rheumatoid arthritis without rheumatoid factor, unspecified shoulder: Secondary | ICD-10-CM | POA: Diagnosis not present

## 2020-10-18 DIAGNOSIS — E559 Vitamin D deficiency, unspecified: Secondary | ICD-10-CM | POA: Diagnosis not present

## 2020-10-18 DIAGNOSIS — M170 Bilateral primary osteoarthritis of knee: Secondary | ICD-10-CM

## 2020-10-18 DIAGNOSIS — M67912 Unspecified disorder of synovium and tendon, left shoulder: Secondary | ICD-10-CM

## 2020-10-18 DIAGNOSIS — M7918 Myalgia, other site: Secondary | ICD-10-CM

## 2020-10-18 NOTE — Progress Notes (Signed)
Office Visit Note  Patient: Sarah Fisher             Date of Birth: 1961/02/15           MRN: [id number]             PCP: Ma Hillock, DO Referring: Ma Hillock, DO Visit Date: 10/18/2020   Subjective:  New Patient (Initial Visit) (Patient complains of bilateral hand pain, stiffness, and swelling as well as constant neck pain and right knee pain. )   History of Present Illness: [name] is a 60 y.o. adult here for evaluation of seronegative rheumatoid arthritis with symptoms of hand pain, neck pain, and shoulder pain with use of ibuprofen for joint pain and recent steroid injections for calcific tendonitis with orthopedic surgery last months. She was previously diagnosed with seronegative RA years ago based on inflammatory activity noted on bone scan and synovial pannus in multiple PIP and MCP joints on ultrasound exam with negative serology and normal inflammatory lab markers. She never took DMARD treatments, but has used NSAIDs and as needed steroid injections long term. Celebrex, diclofenac, and ibuprofen have been helpful she thinks meloxicam did not work for her. She has had left hand surgery for benign tumor removal and a index finger debridement. Multiple MRI assessment shows multilevel spinal degenerative changes without severe stenosis. She has been previously referred to PT and felt treatments worsen the neck pain problem. Her current work requires lifting items to shoulder height or above frequently and this seems to increase her symptoms. The most recent steroid injection for the shoulder helped symptoms about 1 month.  Office notes reviewed Labs reviewed 09/2020 Vit D 22  09/2016 ANA neg RF neg CCP neg  2012 RF-, CCP-, HLA-B27-, normal ESR and CRP DDD of cervical spine  2008 Imaging reviewed DEXA (Lunar) L1-L3 BMD 1.6 T-score 3.7  Ultrasound examination MCPs pannus at 3rd-4th digits PIPs R 2-4th digits with ganglion cyst at 4, L pannus 2nd-5th  digits with effusions of 2nd-3rd  Xray SI Joints Bilateral sacroiliitis of bottom 1/3 of joints with space preserved  Xray hands IP joint space narrowing  Bone scan Increased uptake in several IP joints, right wrist, coxo-femoral joint, right knee, and right tibio-talar joint. SI joint widening noted.  Activities of Daily Living:  Patient reports morning stiffness for 24 hours.   Patient Denies nocturnal pain.  Difficulty dressing/grooming: Reports Difficulty climbing stairs: Reports Difficulty getting out of chair: Denies Difficulty using hands for taps, buttons, cutlery, and/or writing: Reports  Review of Systems  Constitutional: Positive for fatigue.  HENT: Positive for mouth sores. Negative for mouth dryness and nose dryness.   Eyes: Negative for pain, itching, visual disturbance and dryness.  Respiratory: Negative for cough, hemoptysis, shortness of breath and difficulty breathing.   Cardiovascular: Negative for chest pain, palpitations and swelling in legs/feet.  Gastrointestinal: Positive for constipation. Negative for abdominal pain, blood in stool and diarrhea.  Endocrine: Negative for increased urination.  Genitourinary: Negative for painful urination.  Musculoskeletal: Positive for arthralgias, joint pain, joint swelling, myalgias, muscle weakness, morning stiffness, muscle tenderness and myalgias.  Skin: Negative for color change, rash and redness.  Allergic/Immunologic: Negative for susceptible to infections.  Neurological: Positive for numbness. Negative for dizziness, headaches, memory loss and weakness.  Hematological: Negative for swollen glands.  Psychiatric/Behavioral: Positive for sleep disturbance. Negative for confusion.    PMFS History:  Patient Active Problem List   Diagnosis Date Noted  . Calcific tendinitis  of right shoulder 09/06/2020  . Calcific Tendinopathy of Shoulder (Left) 05/02/2020  . Calcific tendinitis of shoulder (Left) 05/02/2020  . Long  term (current) use of opiate analgesic 10/21/2016  . Chronic pain syndrome 10/01/2016  . Osteoarthritis of cervical spine 10/01/2016  . Cervical DDD (degenerative disc disease) (C5-6) 10/01/2016  . Cervical central spinal stenosis C5-6 (Right) 10/01/2016  . Chronic neck pain (Bilateral) (L>R) 10/01/2016  . Cervicogenic headache (Bilateral) (R>L) 10/01/2016  . Occipital headache (Bilateral) (R>L) 10/01/2016  . Neurogenic pain 10/01/2016  . Myofascial pain 10/01/2016  . Chronic shoulder pain (Bilateral) (L>R) 10/01/2016  . Chronic upper back pain (Bilateral) (L>R) 10/01/2016  . Chronic low back pain (Bilateral) (L>R) 10/01/2016  . Cervical spondylosis 10/01/2016  . Numbness of upper extremity (Bilateral) (L>R) 10/01/2016  . Chronic cervical radiculopathy (Bilateral) (L>R) 10/01/2016  . Chronic knee pain (Bilateral) (R>L) 10/01/2016  . Osteoarthritis of knee (Bilateral) (R>L) 10/01/2016  . Rheumatoid arthritis, seronegative, shoulder region (Bilateral) (L>R) 10/01/2016  . Chronic idiopathic constipation 10/01/2016  . Benign paroxysmal positional vertigo of left ear 09/03/2016  . Adenomatous polyp of colon 09/03/2016  . Vitamin D deficiency 05/16/2016  . Overweight 05/15/2016  . Hyperlipidemia 04/14/2016  . Essential hypertension     Past Medical History:  Diagnosis Date  . Anemia   . Arthritis   . Cervical spine degeneration 2010   C5-6  . History of carpal tunnel syndrome   . Hyperlipidemia   . Hypertension   . Sacroiliac joint pain 2010   right  . Scoliosis   . Skin cancer of eyelid     Family History  Problem Relation Age of Onset  . Hypertension Mother   . Arthritis Mother   . Hypertension Father   . Liver cancer Father   . Hypertension Sister   . Arthritis Maternal Aunt   . Hypertension Maternal Grandmother   . Kidney cancer Maternal Grandmother   . Alzheimer's disease Maternal Grandfather   . Colon cancer Maternal Grandfather 36  . Diabetes Maternal Grandfather    . Alzheimer's disease Paternal Grandmother   . Hypertension Brother   . Diabetes Brother   . Esophageal cancer Neg Hx   . Rectal cancer Neg Hx   . Stomach cancer Neg Hx    Past Surgical History:  Procedure Laterality Date  . BREAST SURGERY  1986   reduction  . cervical facet block  2010  . HAND SURGERY Bilateral 2000 and 2016   benign tumor removal   . I & D EXTREMITY Left 08/10/2017   Procedure: DEBRIDEMENT DISTAL INTERPHALANGEAL JOINT;  Surgeon: Leanora Cover, MD;  Location: [facility];  Service: Orthopedics;  Laterality: Left;  . left finger surgery Left 08/2017  . MASS EXCISION Left 08/10/2017   Procedure: LEFT LONG FINGER EXCISION MASS;  Surgeon: Leanora Cover, MD;  Location: [facility];  Service: Orthopedics;  Laterality: Left;  . Branch   Social History   Social History Narrative   Divorced. 3 children, one lives with her [name]).   She is from [place], moved in 2016.    Housewife. B.A. Degree.    Drinks caffeine, uses herbal remedies. Takes a daily vitamin.    Wears her seatbelt, bicycle helmet.Smoke detector in the home.    Exercises routinely.    Feels safe in her relationships.    Immunization History  Administered Date(s) Administered  . Influenza Inj Mdck Quad Pf 04/15/2018  . Influenza,inj,Quad PF,6+ Mos 04/14/2016, 06/03/2017, 04/13/2019  .  Influenza-Unspecified 06/07/2020  . MMR 05/18/2018  . PFIZER(Purple Top)SARS-COV-2 Vaccination 09/10/2019, 10/11/2019, 06/07/2020  . Tdap 12/06/2014  . Zoster Recombinat (Shingrix) 09/04/2020     Objective: Vital Signs: BP (!) 133/92 (BP Location: Left Arm, Patient Position: Sitting, Cuff Size: Normal)   Pulse 70   Ht 5' (1.524 m)   Wt 133 lb 6.4 oz (60.5 kg)   LMP  (LMP Unknown)   BMI 26.05 kg/m    Physical Exam HENT:     Right Ear: External ear normal.     Left Ear: External ear normal.     Mouth/Throat:     Mouth: Mucous membranes are moist.      Pharynx: Oropharynx is clear.  Eyes:     Conjunctiva/sclera: Conjunctivae normal.  Cardiovascular:     Rate and Rhythm: Normal rate and regular rhythm.  Pulmonary:     Effort: Pulmonary effort is normal.     Breath sounds: Normal breath sounds.  Skin:    General: Skin is warm and dry.     Findings: No rash.  Neurological:     General: No focal deficit present.     Mental Status: She is alert.  Psychiatric:        Mood and Affect: Mood normal.     Musculoskeletal Exam:  Neck full ROM tenderness in paraspinal muscles with lateral rotation Shoulders full ROM tenderness with abduction and external rotation some radiation to biceps tendon Elbows full ROM no tenderness or swelling Wrists full ROM no tenderness or swelling Fingers full ROM no tenderness or swelling Knees full ROM mild tenderness around medial joint line, patellofemoral crepitus b/l Ankles full ROM no tenderness or swelling  Investigation: No additional findings.  Imaging: No results found.  Recent Labs: Lab Results  Component Value Date   WBC 4.7 10/04/2020   HGB 11.6 (L) 10/04/2020   PLT 264.0 10/04/2020   NA 140 09/04/2020   K 4.4 09/04/2020   CL 106 09/04/2020   CO2 26 09/04/2020   GLUCOSE 85 09/04/2020   BUN 18 09/04/2020   CREATININE 0.72 09/04/2020   BILITOT 0.4 09/04/2020   ALKPHOS 52 05/04/2020   AST 21 09/04/2020   ALT 17 09/04/2020   PROT 6.4 09/04/2020   ALBUMIN 4.3 05/04/2020   CALCIUM 9.1 09/04/2020   GFRAA 90 05/04/2020    Speciality Comments: No specialty comments available.  Procedures:  No procedures performed Allergies: Patient has no known allergies.   Assessment / Plan:     Visit Diagnoses: Rheumatoid arthritis, seronegative, shoulder region (Bilateral) (L>R) - Plan: Sedimentation rate, C-reactive protein, Rheumatoid factor  No inflammatory joint change are seen on exam today. Will check ESR and CRP also repeating RF serology today. DO not currently recommend DMARD  treatment without clinical evidence of disease activity.  Calcific Tendinopathy of Shoulder (Left) Calcific tendinitis of shoulder (Left)  Shoulder chronic tendonitis agree with antiinflammatory treatments. She has some impingement symptoms so provided instruction on stretching exercises for this today as well.  Vitamin D deficiency  She resumed taking vitamin D supplement after recent low blood test value. I recommended she obtain updated bone density testing with her generalized OA and low vitamin D, last one I see is at least 10 years prior.  Orders: Orders Placed This Encounter  Procedures  . Sedimentation rate  . C-reactive protein  . Rheumatoid factor   No orders of the defined types were placed in this encounter.   Follow-Up Instructions: No follow-ups on file.   Resa Miner  Rice, MD  Note - This record has been created using Editor, commissioning.  Chart creation errors have been sought, but may not always  have been located. Such creation errors do not reflect on  the standard of medical care.

## 2020-10-18 NOTE — Patient Instructions (Addendum)
I recommend having a new bone density testing for osteoporosis based on the amount of osteoarthritis in numerous places throughout the body. This is preferred to be ordered through your primary care office for insurance coverage purpose.  I do not se specific change of active rheumatoid arthritis on exam today, and am checking markers for inflammation on the blood test today.   Shoulder Impingement Syndrome  Shoulder impingement syndrome is a condition that causes pain when connective tissues (tendons) surrounding the shoulder joint become pinched. These tendons are part of the group of muscles and tissues that help to stabilize the shoulder (rotator cuff). Beneath the rotator cuff is a fluid-filled sac (bursa) that allows the muscles and tendons to glide smoothly. The bursa may become swollen or irritated (bursitis). Bursitis, swelling in the rotator cuff tendons, or both conditions can decrease how much space is under a bone in the shoulder joint (acromion), resulting in impingement. What are the causes? Shoulder impingement syndrome may be caused by bursitis or swelling of the rotator cuff tendons, which may result from:  Repetitive overhead arm movements.  Falling onto the shoulder.  Weakness in the shoulder muscles. What increases the risk? You may be more likely to develop this condition if you:  Play sports that involve throwing, such as baseball.  Participate in sports such as tennis, volleyball, and swimming.  Work as a Curator, Games developer, or Architect. Some people are also more likely to develop impingement syndrome because of the shape of their acromion bone. What are the signs or symptoms? The main symptom of this condition is pain on the front or side of the shoulder. The pain may:  Get worse when lifting or raising the arm.  Get worse at night.  Wake you up from sleeping.  Feel sharp when the shoulder is moved and then fade to an ache. Other symptoms may  include:  Tenderness.  Stiffness.  Inability to raise the arm above shoulder level or behind the body.  Weakness. How is this diagnosed? This condition may be diagnosed based on:  Your symptoms and medical history.  A physical exam.  Imaging tests, such as: ? X-rays. ? MRI. ? Ultrasound. How is this treated? This condition may be treated by:  Resting your shoulder and avoiding all activities that cause pain or put stress on the shoulder.  Icing your shoulder.  NSAIDs to help reduce pain and swelling.  One or more injections of medicines to numb the area and reduce inflammation.  Physical therapy.  Surgery. This may be needed if nonsurgical treatments have not helped. Surgery may involve repairing the rotator cuff, reshaping the acromion, or removing the bursa. Follow these instructions at home: Managing pain, stiffness, and swelling  If directed, put ice on the injured area. ? Put ice in a plastic bag. ? Place a towel between your skin and the bag. ? Leave the ice on for 20 minutes, 2-3 times a day.   Activity  Rest and return to your normal activities as told by your health care provider. Ask your health care provider what activities are safe for you.  Do exercises as told by your health care provider. General instructions  Do not use any products that contain nicotine or tobacco, such as cigarettes, e-cigarettes, and chewing tobacco. These can delay healing. If you need help quitting, ask your health care provider.  Ask your health care provider when it is safe for you to drive.  Take over-the-counter and prescription medicines only as told  by your health care provider.  Keep all follow-up visits as told by your health care provider. This is important. How is this prevented?  Give your body time to rest between periods of activity.  Be safe and responsible while being active. This will help you avoid falls.  Maintain physical fitness, including strength  and flexibility. Contact a health care provider if:  Your symptoms have not improved after 1-2 months of treatment and rest.  You cannot lift your arm away from your body. Summary  Shoulder impingement syndrome is a condition that causes pain when connective tissues (tendons) surrounding the shoulder joint become pinched.  The main symptom of this condition is pain on the front or side of the shoulder.  This condition is usually treated with rest, ice, and pain medicines as needed.    Osteoarthritis  Osteoarthritis is a type of arthritis. It refers to joint pain or joint disease. Osteoarthritis affects tissue that covers the ends of bones in joints (cartilage). Cartilage acts as a cushion between the bones and helps them move smoothly. Osteoarthritis occurs when cartilage in the joints gets worn down. Osteoarthritis is sometimes called "wear and tear" arthritis. Osteoarthritis is the most common form of arthritis. It often occurs in older people. It is a condition that gets worse over time. The joints most often affected by this condition are in the fingers, toes, hips, knees, and spine, including the neck and lower back. What are the causes? This condition is caused by the wearing down of cartilage that covers the ends of bones. What increases the risk? The following factors may make you more likely to develop this condition:  Being age 23 or older.  Obesity.  Overuse of joints.  Past injury of a joint.  Past surgery on a joint.  Family history of osteoarthritis. What are the signs or symptoms? The main symptoms of this condition are pain, swelling, and stiffness in the joint. Other symptoms may include:  An enlarged joint.  More pain and further damage caused by small pieces of bone or cartilage that break off and float inside of the joint.  Small deposits of bone (osteophytes) that grow on the edges of the joint.  A grating or scraping feeling inside the joint when you  move it.  Popping or creaking sounds when you move.  Difficulty walking or exercising.  An inability to grip items, twist your hand(s), or control the movements of your hands and fingers. How is this diagnosed? This condition may be diagnosed based on:  Your medical history.  A physical exam.  Your symptoms.  X-rays of the affected joint(s).  Blood tests to rule out other types of arthritis. How is this treated? There is no cure for this condition, but treatment can help control pain and improve joint function. Treatment may include a combination of therapies, such as:  Pain relief techniques, such as: ? Applying heat and cold to the joint. ? Massage. ? A form of talk therapy called cognitive behavioral therapy (CBT). This therapy helps you set goals and follow up on the changes that you make.  Medicines for pain and inflammation. The medicines can be taken by mouth or applied to the skin. They include: ? NSAIDs, such as ibuprofen. ? Prescription medicines. ? Strong anti-inflammatory medicines (corticosteroids). ? Certain nutritional supplements.  A prescribed exercise program. You may work with a physical therapist.  Assistive devices, such as a brace, wrap, splint, specialized glove, or cane.  A weight control plan.  Surgery, such as: ? An osteotomy. This is done to reposition the bones and relieve pain or to remove loose pieces of bone and cartilage. ? Joint replacement surgery. You may need this surgery if you have advanced osteoarthritis. Follow these instructions at home: Activity  Rest your affected joints as told by your health care provider.  Exercise as told by your health care provider. He or she may recommend specific types of exercise, such as: ? Strengthening exercises. These are done to strengthen the muscles that support joints affected by arthritis. ? Aerobic activities. These are exercises, such as brisk walking or water aerobics, that increase your  heart rate. ? Range-of-motion activities. These help your joints move more easily. ? Balance and agility exercises. Managing pain, stiffness, and swelling  If directed, apply heat to the affected area as often as told by your health care provider. Use the heat source that your health care provider recommends, such as a moist heat pack or a heating pad. ? If you have a removable assistive device, remove it as told by your health care provider. ? Place a towel between your skin and the heat source. If your health care provider tells you to keep the assistive device on while you apply heat, place a towel between the assistive device and the heat source. ? Leave the heat on for 20-30 minutes. ? Remove the heat if your skin turns bright red. This is especially important if you are unable to feel pain, heat, or cold. You may have a greater risk of getting burned.  If directed, put ice on the affected area. To do this: ? If you have a removable assistive device, remove it as told by your health care provider. ? Put ice in a plastic bag. ? Place a towel between your skin and the bag. If your health care provider tells you to keep the assistive device on during icing, place a towel between the assistive device and the bag. ? Leave the ice on for 20 minutes, 2-3 times a day. ? Move your fingers or toes often to reduce stiffness and swelling. ? Raise (elevate) the injured area above the level of your heart while you are sitting or lying down.      General instructions  Take over-the-counter and prescription medicines only as told by your health care provider.  Maintain a healthy weight. Follow instructions from your health care provider for weight control.  Do not use any products that contain nicotine or tobacco, such as cigarettes, e-cigarettes, and chewing tobacco. If you need help quitting, ask your health care provider.  Use assistive devices as told by your health care provider.  Keep all  follow-up visits as told by your health care provider. This is important. Where to find more information  [employer] of Arthritis and Musculoskeletal and Skin Diseases: www.niams.SouthExposed.es  [employer] on Aging: http://kim-miller.com/  American College of Rheumatology: www.rheumatology.org Contact a health care provider if:  You have redness, swelling, or a feeling of warmth in a joint that gets worse.  You have a fever along with joint or muscle aches.  You develop a rash.  You have trouble doing your normal activities. Get help right away if:  You have pain that gets worse and is not relieved by pain medicine. Summary  Osteoarthritis is a type of arthritis that affects tissue covering the ends of bones in joints (cartilage).  This condition is caused by the wearing down of cartilage that covers the ends of  bones.  The main symptom of this condition is pain, swelling, and stiffness in the joint.  There is no cure for this condition, but treatment can help control pain and improve joint function.

## 2020-10-19 LAB — C-REACTIVE PROTEIN: CRP: 0.8 mg/L (ref <8.0)

## 2020-10-19 LAB — SEDIMENTATION RATE: Sed Rate: 2 mm/h (ref 0–30)

## 2020-10-19 LAB — RHEUMATOID FACTOR: Rheumatoid fact SerPl-aCnc: <14 IU/mL (ref <14)

## 2020-10-22 ENCOUNTER — Encounter: Payer: Self-pay | Admitting: Family Medicine

## 2020-10-22 DIAGNOSIS — M79602 Pain in left arm: Secondary | ICD-10-CM | POA: Diagnosis not present

## 2020-10-22 DIAGNOSIS — M7531 Calcific tendinitis of right shoulder: Secondary | ICD-10-CM | POA: Diagnosis not present

## 2020-10-22 DIAGNOSIS — M542 Cervicalgia: Secondary | ICD-10-CM | POA: Diagnosis not present

## 2020-10-22 DIAGNOSIS — M5010 Cervical disc disorder with radiculopathy, unspecified cervical region: Secondary | ICD-10-CM | POA: Diagnosis not present

## 2020-10-22 DIAGNOSIS — R202 Paresthesia of skin: Secondary | ICD-10-CM | POA: Diagnosis not present

## 2020-10-23 LAB — POC HEMOCCULT BLD/STL (HOME/3-CARD/SCREEN)
Card #2 Fecal Occult Blod, POC: NEGATIVE
Card #3 Fecal Occult Blood, POC: NEGATIVE
Fecal Occult Blood, POC: NEGATIVE

## 2020-10-29 ENCOUNTER — Telehealth: Payer: Self-pay | Admitting: Family Medicine

## 2020-10-29 DIAGNOSIS — E2839 Other primary ovarian failure: Secondary | ICD-10-CM

## 2020-10-29 NOTE — Telephone Encounter (Signed)
Spoke with pt regarding hemoccult results and recommendations. She was also advised regarding imaging for bone density and mammogram. It was mentioned that she is not due yet for mammogram but advised the order will still be good and they are aware of when next one will be(05/26/21). Advised someone from [facility] should be calling her to schedule but if not, office number provided to contact.

## 2020-10-29 NOTE — Telephone Encounter (Signed)
Pt called in inquiring on her hemoccult card results. There had been a phone note generated last week concerning her results.  - Her hemoccult cards are normal (negative for blood).    Since her cbc had improved on 3/31 labs and her iron levels and hemoccult tests were normal>   - I do not feel she needs an early GI referral for repeat colonoscopy at this time, as long as she is not appreciating any rectal bleeding.  - she can take an OTC iron supplement 2-3 x week if desired to help boost iron stores, but she does not need high doses since her iron levels  in her blood is normal.  Also, I did place her done density screen at [facility] on [address] and her mammogram order had also been placed at her appt 3/1.  Thanks.

## 2020-11-05 ENCOUNTER — Telehealth: Payer: Self-pay

## 2020-11-05 NOTE — Telephone Encounter (Signed)
Patient called back to give [place] fax just in case  [phone]

## 2020-11-05 NOTE — Telephone Encounter (Signed)
Patient called regarding her referral for Bone Denisty test.  She states she has an appt tomorrow at [facility] in [place].  They told patient they would schedule appt but they have not rec'd order from Dr. Raoul Pitch.  If they do not receive order prior to her appt tomorrow morning, her visit will need to be rescheduled.  Printed order and took to [place] so she could get signature from Dr. Raoul Pitch.  Faxed signed order to [facility] [phone].

## 2020-11-06 ENCOUNTER — Encounter: Payer: Self-pay | Admitting: Family Medicine

## 2020-11-06 DIAGNOSIS — Z780 Asymptomatic menopausal state: Secondary | ICD-10-CM | POA: Diagnosis not present

## 2020-11-08 DIAGNOSIS — M5030 Other cervical disc degeneration, unspecified cervical region: Secondary | ICD-10-CM | POA: Diagnosis not present

## 2020-11-08 DIAGNOSIS — M79601 Pain in right arm: Secondary | ICD-10-CM | POA: Diagnosis not present

## 2020-11-08 DIAGNOSIS — M5412 Radiculopathy, cervical region: Secondary | ICD-10-CM | POA: Diagnosis not present

## 2020-11-08 DIAGNOSIS — R202 Paresthesia of skin: Secondary | ICD-10-CM | POA: Diagnosis not present

## 2020-11-09 ENCOUNTER — Telehealth: Payer: Self-pay

## 2020-11-09 NOTE — Telephone Encounter (Signed)
Bone density results received from [facility] and placed on providers desk.   Awaiting mammogram results.

## 2020-11-09 NOTE — Telephone Encounter (Signed)
LVM for pt to CB regarding results.  

## 2020-11-09 NOTE — Telephone Encounter (Signed)
Bone density results are normal.

## 2020-11-12 ENCOUNTER — Encounter: Payer: Self-pay | Admitting: Family Medicine

## 2020-11-12 ENCOUNTER — Other Ambulatory Visit: Payer: Self-pay

## 2020-11-12 ENCOUNTER — Ambulatory Visit: Payer: BC Managed Care – PPO | Admitting: Family Medicine

## 2020-11-12 VITALS — BP 134/93 | HR 68 | Temp 97.4°F | Ht 60.0 in | Wt 136.2 lb

## 2020-11-12 DIAGNOSIS — Z0184 Encounter for antibody response examination: Secondary | ICD-10-CM | POA: Diagnosis not present

## 2020-11-12 DIAGNOSIS — Z111 Encounter for screening for respiratory tuberculosis: Secondary | ICD-10-CM | POA: Diagnosis not present

## 2020-11-12 DIAGNOSIS — Z23 Encounter for immunization: Secondary | ICD-10-CM | POA: Diagnosis not present

## 2020-11-12 DIAGNOSIS — Z789 Other specified health status: Secondary | ICD-10-CM | POA: Insufficient documentation

## 2020-11-12 HISTORY — DX: Other specified health status: Z78.9

## 2020-11-12 NOTE — Telephone Encounter (Signed)
Spoke with patient regarding results/recommendations.  

## 2020-11-12 NOTE — Addendum Note (Signed)
Addended by: Tora Kindred on: 11/12/2020 10:04 AM   Modules accepted: Orders

## 2020-11-12 NOTE — Progress Notes (Signed)
This visit occurred during the SARS-CoV-2 public health emergency.  Safety protocols were in place, including screening questions prior to the visit, additional usage of staff PPE, and extensive cleaning of exam room while observing appropriate contact time as indicated for disinfecting solutions.    Sarah Fisher , August 14, 1960, 60 y.o., adult MRN: [id number] Patient Care Team    Relationship Specialty Notifications Start End  Sarah Hillock, DO PCP - General Family Medicine  04/14/16   Sarah Pole, MD Consulting Physician Gastroenterology  06/10/17   Sarah Pointer, MD Referring Physician Pain Medicine  06/10/17   Sarah Mends, MD Referring Physician Orthopedic Surgery  09/04/20     Chief Complaint  Patient presents with  . bloodwork for new job  . Medication Refill     Subjective: Pt presents for an OV to discuss blood work needed for her employer.  Patients reports she is required to have MMR and varicella titers completed and TB testing. She has a h/o MMR vaccine 05/18/2018 d/t low titre to ruebella (measles and mumps positive titers). Unknown h/o chicken pox. Shingrix #1 vaccine 09/04/2020. Completed series today .  She has had varicella titers positive 05/03/2018> 885.3  Depression screen [facility] 2/9 09/04/2020 06/15/2018 04/05/2018 03/16/2018 02/09/2018  Decreased Interest 0 0 0 0 0  Down, Depressed, Hopeless 0 0 0 0 0  PHQ - 2 Score 0 0 0 0 0    No Known Allergies Social History   Social History Narrative   Divorced. 3 children, one lives with [name]).   She is from [place], moved in 2016.    Housewife. B.A. Degree.    Drinks caffeine, uses herbal remedies. Takes a daily vitamin.    Wears her seatbelt, bicycle helmet.Smoke detector in the home.    Exercises routinely.    Feels safe in her relationships.    Past Medical History:  Diagnosis Date  . Anemia   . Arthritis   . Cervical spine degeneration 2010   C5-6  . History of carpal tunnel syndrome    . Hyperlipidemia   . Hypertension   . Sacroiliac joint pain 2010   right  . Scoliosis   . Skin cancer of eyelid    Past Surgical History:  Procedure Laterality Date  . BREAST SURGERY  1986   reduction  . cervical facet block  2010  . HAND SURGERY Bilateral 2000 and 2016   benign tumor removal   . I & D EXTREMITY Left 08/10/2017   Procedure: DEBRIDEMENT DISTAL INTERPHALANGEAL JOINT;  Surgeon: Sarah Cover, MD;  Location: [place];  Service: Orthopedics;  Laterality: Left;  . left finger surgery Left 08/2017  . MASS EXCISION Left 08/10/2017   Procedure: LEFT LONG FINGER EXCISION MASS;  Surgeon: Sarah Cover, MD;  Location: [place];  Service: Orthopedics;  Laterality: Left;  . WISDOM TOOTH EXTRACTION  1979   Family History  Problem Relation Age of Onset  . Hypertension Mother   . Arthritis Mother   . Hypertension Father   . Liver cancer Father   . Hypertension Sister   . Arthritis Maternal Aunt   . Hypertension Maternal Grandmother   . Kidney cancer Maternal Grandmother   . Alzheimer's disease Maternal Grandfather   . Colon cancer Maternal Grandfather 23  . Diabetes Maternal Grandfather   . Alzheimer's disease Paternal Grandmother   . Hypertension Brother   . Diabetes Brother   . Esophageal cancer Neg Hx   .  Rectal cancer Neg Hx   . Stomach cancer Neg Hx    Allergies as of 11/12/2020   No Known Allergies     Medication List       Accurate as of Nov 12, 2020  9:48 AM. If you have any questions, ask your nurse or doctor.        aspirin EC 81 MG tablet Take 81 mg by mouth daily. Swallow whole.   estradiol 0.1 MG/GM vaginal cream Commonly known as: ESTRACE Place 0.5 g vaginally 2 (two) times a week.   gabapentin 300 MG capsule Commonly known as: NEURONTIN Take 3 capsules (900 mg total) by mouth 3 (three) times daily.   ibuprofen 200 MG tablet Commonly known as: ADVIL Take 400 mg by mouth every 4 (four) hours as needed.    losartan 50 MG tablet Commonly known as: COZAAR Take 1 tablet (50 mg total) by mouth daily.   meloxicam 15 MG tablet Commonly known as: MOBIC Take 15 mg by mouth daily.   Multi-Vitamin Gummies Chew Chew 1 Units by mouth daily.   NON FORMULARY   Omega 3 1000 MG Caps Take 1 capsule by mouth daily.   pravastatin 10 MG tablet Commonly known as: PRAVACHOL Take 1 tablet (10 mg total) by mouth daily.   TIZANIDINE HCL PO Take by mouth as needed.   TRAMADOL HCL PO Take by mouth as needed.   triamcinolone ointment 0.5 % Commonly known as: KENALOG Apply 1 application topically 2 (two) times daily.   UNABLE TO FIND Neuriva   VITAMIN D3 PO Take by mouth.       All past medical history, surgical history, allergies, family history, immunizations andmedications were updated in the EMR today and reviewed under the history and medication portions of their EMR.     ROS: Negative, with the exception of above mentioned in HPI   Objective:  BP (!) 134/93 (BP Location: Right Arm, Patient Position: Sitting, Cuff Size: Large)   Pulse 68   Temp (!) 97.4 F (36.3 C) (Temporal)   Ht 5' (1.524 m)   Wt 136 lb 3.2 oz (61.8 kg)   LMP  (LMP Unknown)   SpO2 98%   BMI 26.60 kg/m  Body mass index is 26.6 kg/m. Gen: Afebrile. No acute distress. Nontoxic in appearance, well developed, well nourished.  HENT: AT. [place].  Eyes:Pupils Equal Round Reactive to light, Extraocular movements intact,  Conjunctiva without redness, discharge or icterus. CV: RRR Chest: CTAB  Neuro: Normal gait. PERLA. EOMi. Alert. Oriented x3  Psych: Normal affect, dress and demeanor. Normal speech. Normal thought content and judgment.  No exam data present No results found. No results found for this or any previous visit (from the past 24 hour(s)).  Assessment/Plan: [name] is a 60 y.o. adult present for OV for  Immunity status testing She has a h/o MMR vaccine 05/18/2018 d/t low titre to ruebella  (measles and mumps positive titers at that time). Will obtain  titre today. If still low she will need 2nd MMR proof or repeat. Unfortunately, Unknown proof of first MMR in life.  She has had varicella titers positive 05/03/2018> 885.3. No need to repeat testing.  - Measles/Mumps/Rubella Immunity  Screening-pulmonary TB - QuantiFERON-TB Gold Plus  Zoster vaccine needed:  Shingrix #1 vaccine 09/04/2020. Shingrix #2 completed  today .    Reviewed expectations re: course of current medical issues.  Discussed self-management of symptoms.  Outlined signs and symptoms indicating need for more acute intervention.  Patient verbalized understanding and all questions were answered.  Patient received an After-Visit Summary.    Orders Placed This Encounter  Procedures  . Measles/Mumps/Rubella Immunity  . QuantiFERON-TB Gold Plus   No orders of the defined types were placed in this encounter.  Referral Orders  No referral(s) requested today     Note is dictated utilizing voice recognition software. Although note has been proof read prior to signing, occasional typographical errors still can be missed. If any questions arise, please do not hesitate to call for verification.   electronically signed by:  Howard Pouch, DO  [place]

## 2020-11-12 NOTE — Patient Instructions (Signed)
  Great to see you today.    If labs were collected, we will inform you of lab results once received either by echart message or telephone call.   - echart message- for normal results that have been seen by the patient already.   - telephone call: abnormal results or if patient has not viewed results in their echart.  

## 2020-11-13 LAB — MEASLES/MUMPS/RUBELLA IMMUNITY
Mumps IgG: 68.9 AU/mL
Rubella: 14.4 Index
Rubeola IgG: 112 AU/mL

## 2020-11-14 LAB — QUANTIFERON-TB GOLD PLUS
Mitogen-NIL: >10 IU/mL
NIL: 0.04 IU/mL
QuantiFERON-TB Gold Plus: NEGATIVE
TB1-NIL: <0 IU/mL
TB2-NIL: <0 IU/mL

## 2020-11-20 DIAGNOSIS — M542 Cervicalgia: Secondary | ICD-10-CM | POA: Diagnosis not present

## 2020-11-20 DIAGNOSIS — M5412 Radiculopathy, cervical region: Secondary | ICD-10-CM | POA: Diagnosis not present

## 2020-11-20 DIAGNOSIS — R202 Paresthesia of skin: Secondary | ICD-10-CM | POA: Diagnosis not present

## 2020-11-20 DIAGNOSIS — M79601 Pain in right arm: Secondary | ICD-10-CM | POA: Diagnosis not present

## 2020-11-22 DIAGNOSIS — M47812 Spondylosis without myelopathy or radiculopathy, cervical region: Secondary | ICD-10-CM | POA: Diagnosis not present

## 2020-11-22 DIAGNOSIS — G5603 Carpal tunnel syndrome, bilateral upper limbs: Secondary | ICD-10-CM | POA: Diagnosis not present

## 2020-11-22 DIAGNOSIS — M5030 Other cervical disc degeneration, unspecified cervical region: Secondary | ICD-10-CM | POA: Diagnosis not present

## 2020-11-22 DIAGNOSIS — M79601 Pain in right arm: Secondary | ICD-10-CM | POA: Insufficient documentation

## 2020-11-27 DIAGNOSIS — M542 Cervicalgia: Secondary | ICD-10-CM | POA: Diagnosis not present

## 2020-11-27 DIAGNOSIS — M79601 Pain in right arm: Secondary | ICD-10-CM | POA: Diagnosis not present

## 2020-11-27 DIAGNOSIS — R202 Paresthesia of skin: Secondary | ICD-10-CM | POA: Diagnosis not present

## 2020-11-27 DIAGNOSIS — M5412 Radiculopathy, cervical region: Secondary | ICD-10-CM | POA: Diagnosis not present

## 2020-12-06 ENCOUNTER — Ambulatory Visit: Payer: BC Managed Care – PPO

## 2020-12-06 DIAGNOSIS — M542 Cervicalgia: Secondary | ICD-10-CM | POA: Diagnosis not present

## 2020-12-06 DIAGNOSIS — M5412 Radiculopathy, cervical region: Secondary | ICD-10-CM | POA: Diagnosis not present

## 2020-12-06 DIAGNOSIS — M79601 Pain in right arm: Secondary | ICD-10-CM | POA: Diagnosis not present

## 2020-12-06 DIAGNOSIS — R202 Paresthesia of skin: Secondary | ICD-10-CM | POA: Diagnosis not present

## 2020-12-11 DIAGNOSIS — M79601 Pain in right arm: Secondary | ICD-10-CM | POA: Diagnosis not present

## 2020-12-11 DIAGNOSIS — M542 Cervicalgia: Secondary | ICD-10-CM | POA: Diagnosis not present

## 2020-12-11 DIAGNOSIS — M5412 Radiculopathy, cervical region: Secondary | ICD-10-CM | POA: Diagnosis not present

## 2020-12-11 DIAGNOSIS — R202 Paresthesia of skin: Secondary | ICD-10-CM | POA: Diagnosis not present

## 2020-12-14 DIAGNOSIS — M5013 Cervical disc disorder with radiculopathy, cervicothoracic region: Secondary | ICD-10-CM | POA: Diagnosis not present

## 2020-12-21 DIAGNOSIS — G5601 Carpal tunnel syndrome, right upper limb: Secondary | ICD-10-CM | POA: Diagnosis not present

## 2020-12-21 DIAGNOSIS — M1811 Unilateral primary osteoarthritis of first carpometacarpal joint, right hand: Secondary | ICD-10-CM | POA: Diagnosis not present

## 2020-12-21 DIAGNOSIS — M25532 Pain in left wrist: Secondary | ICD-10-CM | POA: Diagnosis not present

## 2020-12-21 DIAGNOSIS — M25531 Pain in right wrist: Secondary | ICD-10-CM | POA: Diagnosis not present

## 2020-12-21 DIAGNOSIS — M79644 Pain in right finger(s): Secondary | ICD-10-CM | POA: Diagnosis not present

## 2020-12-28 DIAGNOSIS — M1811 Unilateral primary osteoarthritis of first carpometacarpal joint, right hand: Secondary | ICD-10-CM | POA: Insufficient documentation

## 2021-01-01 DIAGNOSIS — M50323 Other cervical disc degeneration at C6-C7 level: Secondary | ICD-10-CM | POA: Diagnosis not present

## 2021-01-01 DIAGNOSIS — M50221 Other cervical disc displacement at C4-C5 level: Secondary | ICD-10-CM | POA: Diagnosis not present

## 2021-01-01 DIAGNOSIS — M542 Cervicalgia: Secondary | ICD-10-CM | POA: Diagnosis not present

## 2021-02-21 ENCOUNTER — Ambulatory Visit: Payer: BC Managed Care – PPO | Admitting: Family Medicine

## 2021-03-28 ENCOUNTER — Ambulatory Visit: Payer: BC Managed Care – PPO | Admitting: Family Medicine

## 2021-03-28 ENCOUNTER — Encounter: Payer: Self-pay | Admitting: Family Medicine

## 2021-03-28 ENCOUNTER — Other Ambulatory Visit: Payer: Self-pay

## 2021-03-28 VITALS — BP 99/69 | HR 58 | Temp 97.4°F | Ht 60.0 in | Wt 135.0 lb

## 2021-03-28 DIAGNOSIS — E559 Vitamin D deficiency, unspecified: Secondary | ICD-10-CM | POA: Diagnosis not present

## 2021-03-28 DIAGNOSIS — I10 Essential (primary) hypertension: Secondary | ICD-10-CM

## 2021-03-28 DIAGNOSIS — Z23 Encounter for immunization: Secondary | ICD-10-CM | POA: Diagnosis not present

## 2021-03-28 DIAGNOSIS — R790 Abnormal level of blood mineral: Secondary | ICD-10-CM | POA: Diagnosis not present

## 2021-03-28 DIAGNOSIS — E782 Mixed hyperlipidemia: Secondary | ICD-10-CM | POA: Diagnosis not present

## 2021-03-28 LAB — VITAMIN D 25 HYDROXY (VIT D DEFICIENCY, FRACTURES): VITD: 33.27 ng/mL (ref 30.00–100.00)

## 2021-03-28 MED ORDER — LOSARTAN POTASSIUM 50 MG PO TABS: 50.0000 mg | ORAL_TABLET | Freq: Every day | ORAL | 1 refills | Status: DC

## 2021-03-28 MED ORDER — PRAVASTATIN SODIUM 10 MG PO TABS: 10.0000 mg | ORAL_TABLET | Freq: Every day | ORAL | 3 refills | Status: DC

## 2021-03-28 MED ORDER — TRIAMCINOLONE ACETONIDE 0.5 % EX OINT: 1.0000 "application " | TOPICAL_OINTMENT | Freq: Two times a day (BID) | CUTANEOUS | 5 refills | Status: AC

## 2021-03-28 NOTE — Progress Notes (Signed)
This visit occurred during the SARS-CoV-2 public health emergency.  Safety protocols were in place, including screening questions prior to the visit, additional usage of staff PPE, and extensive cleaning of exam room while observing appropriate contact time as indicated for disinfecting solutions.    Patient ID: Sarah Fisher, female  DOB: 1961-04-12, 60 y.o.   MRN: [id number] Patient Care Team    Relationship Specialty Notifications Start End  Ma Hillock, DO PCP - General Family Medicine  04/14/16   Mauri Pole, MD Consulting Physician Gastroenterology  06/10/17   Milinda Pointer, MD Referring Physician Pain Medicine  06/10/17   Kassie Mends, MD Referring Physician Orthopedic Surgery  09/04/20     Chief Complaint  Patient presents with   Hypertension    [place]; pt is fasting    Subjective: Sarah Fisher is a 60 y.o.  Female  present for [facility]  All past medical history, surgical history, allergies, family history, immunizations, medications and social history were updated in the electronic medical record today. All recent labs, ED visits and hospitalizations within the last year were reviewed.  Hypertension/hyperlipidemia/overweight:  Pt reports compliance with Losartan 50 mg QD. Patient denies chest pain, shortness of breath, dizziness or lower extremity edema.  Pt is  prescribed statin. Diet: low sodium- eating healthy- watching her weight Exercise:walking about a mile a day. RF: HLD, HTN, overweight  Depression screen [facility] 2/9 09/04/2020 06/15/2018 04/05/2018 03/16/2018 02/09/2018  Decreased Interest 0 0 0 0 0  Down, Depressed, Hopeless 0 0 0 0 0  PHQ - 2 Score 0 0 0 0 0   No flowsheet data found.   Immunization History  Administered Date(s) Administered   Influenza Inj Mdck Quad Pf 04/15/2018   Influenza,inj,Quad PF,6+ Mos 04/14/2016, 06/03/2017, 04/13/2019, 03/28/2021   Influenza-Unspecified 06/07/2020   MMR 05/18/2018   PFIZER(Purple Top)SARS-COV-2  Vaccination 09/10/2019, 10/11/2019, 06/07/2020   Tdap 12/06/2014   Zoster Recombinat (Shingrix) 09/04/2020, 11/12/2020    Past Medical History:  Diagnosis Date   Anemia    Arthritis    Cervical spine degeneration 2010   C5-6   History of carpal tunnel syndrome    Hyperlipidemia    Hypertension    Sacroiliac joint pain 2010   right   Scoliosis    Skin cancer of eyelid    No Known Allergies Past Surgical History:  Procedure Laterality Date   BREAST SURGERY  1986   reduction   cervical facet block  2010   HAND SURGERY Bilateral 2000 and 2016   benign tumor removal    I & D EXTREMITY Left 08/10/2017   Procedure: DEBRIDEMENT DISTAL INTERPHALANGEAL JOINT;  Surgeon: Leanora Cover, MD;  Location: [place];  Service: Orthopedics;  Laterality: Left;   left finger surgery Left 08/2017   MASS EXCISION Left 08/10/2017   Procedure: LEFT LONG FINGER EXCISION MASS;  Surgeon: Leanora Cover, MD;  Location: [place];  Service: Orthopedics;  Laterality: Left;   WISDOM TOOTH EXTRACTION  1979   Family History  Problem Relation Age of Onset   Hypertension Mother    Arthritis Mother    Hypertension Father    Liver cancer Father    Hypertension Sister    Arthritis Maternal Aunt    Hypertension Maternal Grandmother    Kidney cancer Maternal Grandmother    Alzheimer's disease Maternal Grandfather    Colon cancer Maternal Grandfather 60   Diabetes Maternal Grandfather    Alzheimer's disease Paternal Grandmother  Hypertension Brother    Diabetes Brother    Esophageal cancer Neg Hx    Rectal cancer Neg Hx    Stomach cancer Neg Hx    Social History   Social History Narrative   Divorced. 3 children, one lives with her [name]).   She is from [place], moved in 2016.    Housewife. B.A. Degree.    Drinks caffeine, uses herbal remedies. Takes a daily vitamin.    Wears her seatbelt, bicycle helmet.Smoke detector in the home.    Exercises routinely.    Feels  safe in her relationships.     Allergies as of 03/28/2021   No Known Allergies      Medication List        Accurate as of March 28, 2021 10:00 AM. If you have any questions, ask your nurse or doctor.          STOP taking these medications    aspirin EC 81 MG tablet Stopped by: Howard Pouch, DO       TAKE these medications    estradiol 0.1 MG/GM vaginal cream Commonly known as: ESTRACE Place 0.5 g vaginally 2 (two) times a week.   gabapentin 300 MG capsule Commonly known as: NEURONTIN Take 3 capsules (900 mg total) by mouth 3 (three) times daily.   ibuprofen 200 MG tablet Commonly known as: ADVIL Take 400 mg by mouth every 4 (four) hours as needed.   IRON-VITAMIN C PO Take by mouth.   lidocaine 1 % (with preservative) injection Commonly known as: XYLOCAINE by Infiltration route.   losartan 50 MG tablet Commonly known as: COZAAR Take 1 tablet (50 mg total) by mouth daily.   meloxicam 15 MG tablet Commonly known as: MOBIC Take 15 mg by mouth daily.   Multi-Vitamin Gummies Chew Chew 1 Units by mouth daily.   NON FORMULARY   Omega 3 1000 MG Caps Take 1 capsule by mouth daily.   OSTEO BI-FLEX ONE PER DAY PO Take by mouth.   pravastatin 10 MG tablet Commonly known as: PRAVACHOL Take 1 tablet (10 mg total) by mouth daily.   tizanidine 2 MG capsule Commonly known as: ZANAFLEX Take 2 mg by mouth 3 (three) times daily. What changed: Another medication with the same name was removed. Continue taking this medication, and follow the directions you see here. Changed by: Howard Pouch, DO   TRAMADOL HCL PO Take by mouth as needed.   triamcinolone ointment 0.5 % Commonly known as: KENALOG Apply 1 application topically 2 (two) times daily.   UNABLE TO FIND Neuriva   VITAMIN D3 PO Take by mouth.        All past medical history, surgical history, allergies, family history, immunizations andmedications were updated in the EMR today and  reviewed under the history and medication portions of their EMR.     No results found for this or any previous visit (from the past 2160 hour(s)).  No results found.   ROS: 14 pt review of systems performed and negative (unless mentioned in an HPI)  Objective: BP 99/69   Pulse (!) 58   Temp (!) 97.4 F (36.3 C) (Oral)   Ht 5' (1.524 m)   Wt 135 lb (61.2 kg)   LMP  (LMP Unknown)   SpO2 98%   BMI 26.37 kg/m  Gen: Afebrile. No acute distress. Nontoxic. Pleasant female.  HENT: AT. [place].  Eyes:Pupils Equal Round Reactive to light, Extraocular movements intact,  Conjunctiva without redness, discharge or icterus. CV:  RRR no murmur, no edema, +2/4 P posterior tibialis pulses Chest: CTAB, no wheeze or crackles Skin: no rashes, purpura or petechiae.  Neuro:  Normal gait. PERLA. EOMi. Alert. Oriented x3 Psych: Normal affect, dress and demeanor. Normal speech. Normal thought content and judgment.   No results found.  Assessment/plan: Sarah Fisher is a 60 y.o. female present for re-est [facility] Essential hypertension/HLD/overweight Stable.  Monitor and if consistently below 110 can cut losartan back to 1/2 tab. Goal < 259 systolic.  Continue  losartan 50 mg QD.  - low salt diet, exercise.  Continue  pravastatin 10 mg daily - f/u 5.5 mos   Vit d def:  - pt taking 2000u daily of vit d, - vit d collected today  Low ferritin Taking iron, but would like to stop or lower dose.  - Iron, TIBC and Ferritin Panel  Influenza vaccine immunization administered.  Return in about 5 months (around 09/11/2021) for CPE (30 min), [facility] (30 min).    Orders Placed This Encounter  Procedures   Flu Vaccine QUAD 6+ mos PF IM (Fluarix Quad PF)   Vitamin D (25 hydroxy)   Iron, TIBC and Ferritin Panel    Meds ordered this encounter  Medications   losartan (COZAAR) 50 MG tablet    Sig: Take 1 tablet (50 mg total) by mouth daily.    Dispense:  90 tablet    Refill:  1    Needs follow up appointment  for further refills.   triamcinolone ointment (KENALOG) 0.5 %    Sig: Apply 1 application topically 2 (two) times daily.    Dispense:  30 g    Refill:  5   pravastatin (PRAVACHOL) 10 MG tablet    Sig: Take 1 tablet (10 mg total) by mouth daily.    Dispense:  90 tablet    Refill:  3    Referral Orders  No referral(s) requested today     Electronically signed by: Howard Pouch, [place]

## 2021-03-28 NOTE — Patient Instructions (Signed)
Great to see you today.  I have refilled the medication(s) we provide.   If labs were collected, we will inform you of lab results once received either by echart message or telephone call.   - echart message- for normal results that have been seen by the patient already.   - telephone call: abnormal results or if patient has not viewed results in their echart.  

## 2021-03-29 LAB — IRON,TIBC AND FERRITIN PANEL
%SAT: 27 % (calc) (ref 16–45)
Ferritin: 31 ng/mL (ref 16–232)
Iron: 103 ug/dL (ref 45–160)
TIBC: 381 mcg/dL (calc) (ref 250–450)

## 2021-06-04 ENCOUNTER — Other Ambulatory Visit: Payer: Self-pay

## 2021-06-04 ENCOUNTER — Encounter: Payer: Self-pay | Admitting: Student in an Organized Health Care Education/Training Program

## 2021-06-04 ENCOUNTER — Ambulatory Visit
Payer: BC Managed Care – PPO | Attending: Student in an Organized Health Care Education/Training Program | Admitting: Student in an Organized Health Care Education/Training Program

## 2021-06-04 VITALS — BP 144/94 | HR 73 | Temp 97.0°F | Resp 15 | Ht 60.0 in | Wt 135.0 lb

## 2021-06-04 DIAGNOSIS — G4486 Cervicogenic headache: Secondary | ICD-10-CM | POA: Diagnosis not present

## 2021-06-04 DIAGNOSIS — M47812 Spondylosis without myelopathy or radiculopathy, cervical region: Secondary | ICD-10-CM | POA: Insufficient documentation

## 2021-06-04 DIAGNOSIS — G894 Chronic pain syndrome: Secondary | ICD-10-CM

## 2021-06-04 MED ORDER — TIZANIDINE HCL 4 MG PO TABS: 4.0000 mg | ORAL_TABLET | Freq: Every evening | ORAL | 2 refills | Status: DC | PRN

## 2021-06-04 MED ORDER — GABAPENTIN 300 MG PO CAPS: ORAL_CAPSULE | ORAL | 11 refills | Status: DC

## 2021-06-04 NOTE — Progress Notes (Signed)
PROVIDER NOTE: Information contained herein reflects review and annotations entered in association with encounter. Interpretation of such information and data should be left to medically-trained personnel. Information provided to patient can be located elsewhere in the medical record under "Patient Instructions". Document created using STT-dictation technology, any transcriptional errors that may result from process are unintentional.    Patient: Sarah Fisher  Service Category: E/M  Provider: Gillis Santa, MD  DOB: September 01, 1960  DOS: 06/04/2021  Specialty: Interventional Pain Management  MRN: [id number]  Setting: Ambulatory outpatient  PCP: Ma Hillock, DO  Type: Established Patient    Referring Provider: Ma Hillock, DO  Location: Office  Delivery: Face-to-face     HPI  Sarah Fisher, a 60 y.o. year old adult, is here today because of her Cervical facet joint syndrome [M47.812]. [name] primary complain today is Neck Pain Pain Assessment: Severity of Chronic pain is reported as a 6 /10. Location: Neck  /up into head. Onset: More than a month ago. Quality: Aching, Burning, Throbbing, Sharp, Shooting, Constant. Timing: Constant. Modifying factor(s): heat,advil. Vitals:  height is 5' (1.524 m) and weight is 135 lb (61.2 kg). Her temporal temperature is 97 F (36.1 C) (abnormal). Her blood pressure is 144/94 (abnormal) and her pulse is 73. Her respiration is 15 and oxygen saturation is 100%.   Reason for encounter:   Trust presents today for increased cervical spine pain.  Of note, patient's last visit with me was back in November 2020.  She states that she has to find another orthopedic provider as her insurance would not cover her visits here.  She states that she has seen an orthopedist in [place].  There she had an MRI done.  She has worsening cervical facet disease which could be contributing to her increased neck pain and occipital dominant headaches.  She continues  gabapentin 900 mg nightly.  She is on tizanidine 2 mg nightly as needed.  She has done physical therapy for over 4 weeks without any significant benefit.  She has limited cervical range of motion.  She is trying to utilize lotion and cream to her neck along with a heating pad.  We discussed bilateral C3, C4, C5 cervical facet medial branch nerve blocks based upon her current symptoms and MRI of her cervical spine as below.  Risks and benefits reviewed patient like to proceed.  I will also refill her tizanidine and gabapentin as below.  Recommend dose escalation optimize pain management.  ROS  Constitutional: Denies any fever or chills Gastrointestinal: No reported hemesis, hematochezia, vomiting, or acute GI distress Musculoskeletal:  Increased bilateral neck pain with radiation to posterior scalp Neurological: No reported episodes of acute onset apraxia, aphasia, dysarthria, agnosia, amnesia, paralysis, loss of coordination, or loss of consciousness  Medication Review  Boswellia-Glucosamine-Vit D, Cholecalciferol, Ferrous Gluconate-C-Folic Acid, Iron-Vitamin C, Multi-Vitamin Gummies, NON FORMULARY, UNABLE TO FIND, estradiol, gabapentin, ibuprofen, losartan, pravastatin, tiZANidine, traMADol HCl, and triamcinolone ointment  History Review  Allergy: Sarah Fisher has No Known Allergies. Drug: Sarah Fisher  reports no history of drug use. Alcohol:  reports current alcohol use. Tobacco:  reports that she has never smoked. She has never used smokeless tobacco. Social: Sarah Fisher  reports that she has never smoked. She has never used smokeless tobacco. She reports current alcohol use. She reports that she does not use drugs. Medical:  has a past medical history of Anemia, Arthritis, Cervical spine degeneration (2010), History of carpal tunnel syndrome, Hyperlipidemia, Hypertension, Sacroiliac joint pain (2010),  Scoliosis, and Skin cancer of eyelid. Surgical: Sarah Fisher  has a past surgical history  that includes Breast surgery (1986); Hand surgery (Bilateral, 2000 and 2016); cervical facet block (2010); Wisdom tooth extraction (1979); Mass excision (Left, 08/10/2017); I & D extremity (Left, 08/10/2017); and left finger surgery (Left, 08/2017). Family: family history includes Alzheimer's disease in her maternal grandfather and paternal grandmother; Arthritis in her maternal aunt and mother; Colon cancer (age of onset: 3) in her maternal grandfather; Diabetes in her brother and maternal grandfather; Hypertension in her brother, father, maternal grandmother, mother, and sister; Kidney cancer in her maternal grandmother; Liver cancer in her father.  Laboratory Chemistry Profile   Renal Lab Results  Component Value Date   BUN 18 09/04/2020   CREATININE 0.72 42/59/5638   BCR NOT APPLICABLE 75/64/3329   GFR 103.25 06/15/2018   GFRAA 90 05/04/2020   GFRNONAA 90 05/04/2020    Hepatic Lab Results  Component Value Date   AST 21 09/04/2020   ALT 17 09/04/2020   ALBUMIN 4.3 05/04/2020   ALKPHOS 52 05/04/2020   HCVAB NEGATIVE 05/15/2016    Electrolytes Lab Results  Component Value Date   NA 140 09/04/2020   K 4.4 09/04/2020   CL 106 09/04/2020   CALCIUM 9.1 09/04/2020   MG 2.2 10/01/2016    Bone Lab Results  Component Value Date   VD25OH 33.27 03/28/2021   25OHVITD1 40 10/01/2016   25OHVITD2 18 10/01/2016   25OHVITD3 22 10/01/2016    Inflammation (CRP: Acute Phase) (ESR: Chronic Phase) Lab Results  Component Value Date   CRP 0.8 10/18/2020   ESRSEDRATE 2 10/18/2020         Note: Above Lab results reviewed.   Physical Exam  General appearance: Well nourished, well developed, and well hydrated. In no apparent acute distress Mental status: Alert, oriented x 3 (person, place, & time)       Respiratory: No evidence of acute respiratory distress Eyes: PERLA Vitals: BP (!) 144/94   Pulse 73   Temp (!) 97 F (36.1 C) (Temporal)   Resp 15   Ht 5' (1.524 m)   Wt 135 lb (61.2  kg)   LMP  (LMP Unknown)   SpO2 100%   BMI 26.37 kg/m  BMI: Estimated body mass index is 26.37 kg/m as calculated from the following:   Height as of this encounter: 5' (1.524 m).   Weight as of this encounter: 135 lb (61.2 kg). Ideal: Unrecognized patient sex    Cervical Spine Area Exam  Skin & Axial Inspection: No masses, redness, edema, swelling, or associated skin lesions Alignment: Symmetrical Functional ROM: Decreased ROM, to the left Stability: No instability detected Muscle Tone/Strength: Functionally intact. No obvious neuro-muscular anomalies detected. Sensory (Neurological): Arthropathic arthralgia Palpation: Complains of area being tender to palpation Positive provocative maneuver for for cervical facet disease         Upper Extremity (UE) Exam      Side: Right upper extremity   Side: Left upper extremity   Skin & Extremity Inspection: Skin color, temperature, and hair growth are WNL. No peripheral edema or cyanosis. No masses, redness, swelling, asymmetry, or associated skin lesions. No contractures.   Skin & Extremity Inspection: Skin color, temperature, and hair growth are WNL. No peripheral edema or cyanosis. No masses, redness, swelling, asymmetry, or associated skin lesions. No contractures.   Functional ROM: Unrestricted ROM           Functional ROM: Unrestricted ROM  Muscle Tone/Strength: Functionally intact. No obvious neuro-muscular anomalies detected.   Muscle Tone/Strength: Functionally intact. No obvious neuro-muscular anomalies detected.     Assessment   Status Diagnosis  Having a Flare-up Worsening Worsening 1. Cervical facet joint syndrome   2. Chronic pain syndrome   3. Cervicogenic headache (Bilateral) (R>L)      Updated Problems: Problem  Cervical Facet Joint Syndrome    Plan of Care  Problem-specific:  No problem-specific Assessment & Plan notes found for this encounter.  Sarah Fisher has a current medication list which  includes the following long-term medication(s): losartan, pravastatin, and gabapentin.  Pharmacotherapy (Medications Ordered): Meds ordered this encounter  Medications   gabapentin (NEURONTIN) 300 MG capsule    Sig: 600 mg qAM, 900 mg qHS    Dispense:  120 capsule    Refill:  11    Fill one day early if pharmacy is closed on scheduled refill date. May substitute for generic, or similar, if available. Void any older refills or prescriptions of this medication.   tiZANidine (ZANAFLEX) 4 MG tablet    Sig: Take 1 tablet (4 mg total) by mouth at bedtime as needed for muscle spasms.    Dispense:  90 tablet    Refill:  2    Do not place this medication, or any other prescription from our practice, on "Automatic Refill". Patient may have prescription filled one day early if pharmacy is closed on scheduled refill date.   Orders:  Orders Placed This Encounter  Procedures   CERVICAL FACET (MEDIAL BRANCH NERVE BLOCK)     Standing Status:   Future    Standing Expiration Date:   07/04/2021    Scheduling Instructions:     Side: Bilateral     Level: C3-4, C4-5, Facet joints (C3, C4, C5,)Medial Branch Nerves)     Sedation: IV versed     Timeframe: As soon as schedule allows    Order Specific Question:   Where will this procedure be performed?    Answer:   [facility]   Follow-up plan:   Return in about 2 weeks (around 06/18/2021) for B/L C3,4,5 Fcts with IV Versed (in clinic).    Recent Visits No visits were found meeting these conditions. Showing recent visits within past 90 days and meeting all other requirements Today's Visits Date Type Provider Dept  06/04/21 Office Visit Gillis Santa, MD [facility]  Showing today's visits and meeting all other requirements Future Appointments No visits were found meeting these conditions. Showing future appointments within next 90 days and meeting all other requirements I discussed the assessment and treatment plan with the  patient. The patient was provided an opportunity to ask questions and all were answered. The patient agreed with the plan and demonstrated an understanding of the instructions.  Patient advised to call back or seek an in-person evaluation if the symptoms or condition worsens.  Duration of encounter: 7mnutes.  Note by: BGillis Santa MD Date: 06/04/2021; Time: 8:50 AM

## 2021-06-04 NOTE — Progress Notes (Signed)
Safety precautions to be maintained throughout the outpatient stay will include: orient to surroundings, keep bed in low position, maintain call bell within reach at all times, provide assistance with transfer out of bed and ambulation.  

## 2021-06-04 NOTE — Patient Instructions (Signed)
GENERAL RISKS AND COMPLICATIONS ° °What are the risk, side effects and possible complications? °Generally speaking, most procedures are safe.  However, with any procedure there are risks, side effects, and the possibility of complications.  The risks and complications are dependent upon the sites that are lesioned, or the type of nerve block to be performed.  The closer the procedure is to the spine, the more serious the risks are.  Great care is taken when placing the radio frequency needles, block needles or lesioning probes, but sometimes complications can occur. °Infection: Any time there is an injection through the skin, there is a risk of infection.  This is why sterile conditions are used for these blocks.  There are four possible types of infection. °Localized skin infection. °Central Nervous System Infection-This can be in the form of Meningitis, which can be deadly. °Epidural Infections-This can be in the form of an epidural abscess, which can cause pressure inside of the spine, causing compression of the spinal cord with subsequent paralysis. This would require an emergency surgery to decompress, and there are no guarantees that the patient would recover from the paralysis. °Discitis-This is an infection of the intervertebral discs.  It occurs in about 1% of discography procedures.  It is difficult to treat and it may lead to surgery. ° °      2. Pain: the needles have to go through skin and soft tissues, will cause soreness. °      3. Damage to internal structures:  The nerves to be lesioned may be near blood vessels or   ° other nerves which can be potentially damaged. °      4. Bleeding: Bleeding is more common if the patient is taking blood thinners such as  aspirin, Coumadin, Ticiid, Plavix, etc., or if he/she have some genetic predisposition  such as hemophilia. Bleeding into the spinal canal can cause compression of the spinal  cord with subsequent paralysis.  This would require an emergency  surgery to  decompress and there are no guarantees that the patient would recover from the  paralysis. °      5. Pneumothorax:  Puncturing of a lung is a possibility, every time a needle is introduced in  the area of the chest or upper back.  Pneumothorax refers to free air around the  collapsed lung(s), inside of the thoracic cavity (chest cavity).  Another two possible  complications related to a similar event would include: Hemothorax and Chylothorax.   These are variations of the Pneumothorax, where instead of air around the collapsed  lung(s), you may have blood or chyle, respectively. °      6. Spinal headaches: They may occur with any procedures in the area of the spine. °      7. Persistent CSF (Cerebro-Spinal Fluid) leakage: This is a rare problem, but may occur  with prolonged intrathecal or epidural catheters either due to the formation of a fistulous  track or a dural tear. °      8. Nerve damage: By working so close to the spinal cord, there is always a possibility of  nerve damage, which could be as serious as a permanent spinal cord injury with  paralysis. °      9. Death:  Although rare, severe deadly allergic reactions known as "Anaphylactic  reaction" can occur to any of the medications used. °     10. Worsening of the symptoms:  We can always make thing worse. ° °What are the chances   of something like this happening? °Chances of any of this occuring are extremely low.  By statistics, you have more of a chance of getting killed in a motor vehicle accident: while driving to the hospital than any of the above occurring .  Nevertheless, you should be aware that they are possibilities.  In general, it is similar to taking a shower.  Everybody knows that you can slip, hit your head and get killed.  Does that mean that you should not shower again?  Nevertheless always keep in mind that statistics do not mean anything if you happen to be on the wrong side of them.  Even if a procedure has a 1 (one) in a  1,000,000 (million) chance of going wrong, it you happen to be that one..Also, keep in mind that by statistics, you have more of a chance of having something go wrong when taking medications. ° °Who should not have this procedure? °If you are on a blood thinning medication (e.g. Coumadin, Plavix, see list of "Blood Thinners"), or if you have an active infection going on, you should not have the procedure.  If you are taking any blood thinners, please inform your physician. ° °How should I prepare for this procedure? °Do not eat or drink anything at least six hours prior to the procedure. °Bring a driver with you .  It cannot be a taxi. °Come accompanied by an adult that can drive you back, and that is strong enough to help you if your legs get weak or numb from the local anesthetic. °Take all of your medicines the morning of the procedure with just enough water to swallow them. °If you have diabetes, make sure that you are scheduled to have your procedure done first thing in the morning, whenever possible. °If you have diabetes, take only half of your insulin dose and notify our nurse that you have done so as soon as you arrive at the clinic. °If you are diabetic, but only take blood sugar pills (oral hypoglycemic), then do not take them on the morning of your procedure.  You may take them after you have had the procedure. °Do not take aspirin or any aspirin-containing medications, at least eleven (11) days prior to the procedure.  They may prolong bleeding. °Wear loose fitting clothing that may be easy to take off and that you would not mind if it got stained with Betadine or blood. °Do not wear any jewelry or perfume °Remove any nail coloring.  It will interfere with some of our monitoring equipment. ° °NOTE: Remember that this is not meant to be interpreted as a complete list of all possible complications.  Unforeseen problems may occur. ° °BLOOD THINNERS °The following drugs contain aspirin or other products,  which can cause increased bleeding during surgery and should not be taken for 2 weeks prior to and 1 week after surgery.  If you should need take something for relief of minor pain, you may take acetaminophen which is found in Tylenol,m Datril, Anacin-3 and Panadol. It is not blood thinner. The products listed below are.  Do not take any of the products listed below in addition to any listed on your instruction sheet. ° °A.P.C or A.P.C with Codeine Codeine Phosphate Capsules #3 Ibuprofen Ridaura  °ABC compound Congesprin Imuran rimadil  °Advil Cope Indocin Robaxisal  °Alka-Seltzer Effervescent Pain Reliever and Antacid Coricidin or Coricidin-D ° Indomethacin Rufen  °Alka-Seltzer plus Cold Medicine Cosprin Ketoprofen S-A-C Tablets  °Anacin Analgesic Tablets or Capsules Coumadin   Korlgesic Salflex  Anacin Extra Strength Analgesic tablets or capsules CP-2 Tablets Lanoril Salicylate  Anaprox Cuprimine Capsules Levenox Salocol  Anexsia-D Dalteparin Magan Salsalate  Anodynos Darvon compound Magnesium Salicylate Sine-off  Ansaid Dasin Capsules Magsal Sodium Salicylate  Anturane Depen Capsules Marnal Soma  APF Arthritis pain formula Dewitt's Pills Measurin Stanback  Argesic Dia-Gesic Meclofenamic Sulfinpyrazone  Arthritis Bayer Timed Release Aspirin Diclofenac Meclomen Sulindac  Arthritis pain formula Anacin Dicumarol Medipren Supac  Analgesic (Safety coated) Arthralgen Diffunasal Mefanamic Suprofen  Arthritis Strength Bufferin Dihydrocodeine Mepro Compound Suprol  Arthropan liquid Dopirydamole Methcarbomol with Aspirin Synalgos  ASA tablets/Enseals Disalcid Micrainin Tagament  Ascriptin Doan's Midol Talwin  Ascriptin A/D Dolene Mobidin Tanderil  Ascriptin Extra Strength Dolobid Moblgesic Ticlid  Ascriptin with Codeine Doloprin or Doloprin with Codeine Momentum Tolectin  Asperbuf Duoprin Mono-gesic Trendar  Aspergum Duradyne Motrin or Motrin IB Triminicin  Aspirin plain, buffered or enteric coated  Durasal Myochrisine Trigesic  Aspirin Suppositories Easprin Nalfon Trillsate  Aspirin with Codeine Ecotrin Regular or Extra Strength Naprosyn Uracel  Atromid-S Efficin Naproxen Ursinus  Auranofin Capsules Elmiron Neocylate Vanquish  Axotal Emagrin Norgesic Verin  Azathioprine Empirin or Empirin with Codeine Normiflo Vitamin E  Azolid Emprazil Nuprin Voltaren  Bayer Aspirin plain, buffered or children's or timed BC Tablets or powders Encaprin Orgaran Warfarin Sodium  Buff-a-Comp Enoxaparin Orudis Zorpin  Buff-a-Comp with Codeine Equegesic Os-Cal-Gesic   Buffaprin Excedrin plain, buffered or Extra Strength Oxalid   Bufferin Arthritis Strength Feldene Oxphenbutazone   Bufferin plain or Extra Strength Feldene Capsules Oxycodone with Aspirin   Bufferin with Codeine Fenoprofen Fenoprofen Pabalate or Pabalate-SF   Buffets II Flogesic Panagesic   Buffinol plain or Extra Strength Florinal or Florinal with Codeine Panwarfarin   Buf-Tabs Flurbiprofen Penicillamine   Butalbital Compound Four-way cold tablets Penicillin   Butazolidin Fragmin Pepto-Bismol   Carbenicillin Geminisyn Percodan   Carna Arthritis Reliever Geopen Persantine   Carprofen Gold's salt Persistin   Chloramphenicol Goody's Phenylbutazone   Chloromycetin Haltrain Piroxlcam   Clmetidine heparin Plaquenil   Cllnoril Hyco-pap Ponstel   Clofibrate Hydroxy chloroquine Propoxyphen         Before stopping any of these medications, be sure to consult the physician who ordered them.  Some, such as Coumadin (Warfarin) are ordered to prevent or treat serious conditions such as "deep thrombosis", "pumonary embolisms", and other heart problems.  The amount of time that you may need off of the medication may also vary with the medication and the reason for which you were taking it.  If you are taking any of these medications, please make sure you notify your pain physician before you undergo any procedures.         Moderate Conscious  Sedation, Adult Sedation is the use of medicines to promote relaxation and to relieve discomfort and anxiety. Moderate conscious sedation is a type of sedation. Under moderate conscious sedation, you are less alert than normal, but you are still able to respond to instructions, touch, or both. Moderate conscious sedation is used during short medical and dental procedures. It is milder than deep sedation, which is a type of sedation under which you cannot be easily woken up. It is also milder than general anesthesia, which is the use of medicines to make you unconscious. Moderate conscious sedation allows you to return to your regular activities sooner. Tell a health care provider about: Any allergies you have. All medicines you are taking, including vitamins, herbs, eye drops, creams, and over-the-counter medicines. Any use of steroids. This includes  steroids taken by mouth or as a cream. Any problems you or family members have had with sedatives and anesthetic medicines. Any blood disorders you have. Any surgeries you have had. Any medical conditions you have, such as sleep apnea. Whether you are pregnant or may be pregnant. Any use of cigarettes, alcohol, marijuana, or drugs. What are the risks? Generally, this is a safe procedure. However, problems may occur, including: Getting too much medicine (oversedation). Nausea. Allergic reaction to medicines. Trouble breathing. If this happens, a breathing tube may be used. It will be removed when you are awake and breathing on your own. Heart trouble. Lung trouble. Confusion that gets better with time (emergence delirium). What happens before the procedure? Staying hydrated Follow instructions from your health care provider about hydration, which may include: Up to 2 hours before the procedure - you may continue to drink clear liquids, such as water, clear fruit juice, black coffee, and plain tea. Eating and drinking restrictions Follow  instructions from your health care provider about eating and drinking, which may include: 8 hours before the procedure - stop eating heavy meals or foods, such as meat, fried foods, or fatty foods. 6 hours before the procedure - stop eating light meals or foods, such as toast or cereal. 6 hours before the procedure - stop drinking milk or drinks that contain milk. 2 hours before the procedure - stop drinking clear liquids. Medicines Ask your health care provider about: Changing or stopping your regular medicines. This is especially important if you are taking diabetes medicines or blood thinners. Taking medicines such as aspirin and ibuprofen. These medicines can thin your blood. Do not take these medicines unless your health care provider tells you to take them. Taking over-the-counter medicines, vitamins, herbs, and supplements. Tests and exams You will have a physical exam. You may have blood tests done to show how well: Your kidneys and liver work. Your blood clots. General instructions Plan to have a responsible adult take you home from the hospital or clinic. If you will be going home right after the procedure, plan to have a responsible adult care for you for the time you are told. This is important. What happens during the procedure?  You will be given the sedative. The sedative may be given: As a pill that you will swallow. It can also be inserted into the rectum. As a spray through the nose. As an injection into the muscle. As an injection into the vein through an IV. You may be given oxygen as needed. Your breathing, heart rate, and blood pressure will be monitored during the procedure. The medical or dental procedure will be done. The procedure may vary among health care providers and hospitals. What happens after the procedure? Your blood pressure, heart rate, breathing rate, and blood oxygen level will be monitored until you leave the hospital or clinic. You will get  fluids through your IV if needed. Do not drive or operate machinery until your health care provider says that it is safe. Summary Sedation is the use of medicines to promote relaxation and to relieve discomfort and anxiety. Moderate conscious sedation is a type of sedation that is used during short medical and dental procedures. Tell the health care provider about any medical conditions that you have and about all the medicines that you are taking. You will be given the sedative as a pill, a spray through the nose, an injection into the muscle, or an injection into the vein through an IV. Vital  signs are monitored during the sedation. Moderate conscious sedation allows you to return to your regular activities sooner. This information is not intended to replace advice given to you by your health care provider. Make sure you discuss any questions you have with your health care provider. Document Revised: 10/21/2019 Document Reviewed: 05/19/2019 Elsevier Patient Education  [place] Facet Blocks Patient Information  Description: The facets are joints in the spine between the vertebrae.  Like any joints in the body, facets can become irritated and painful.  Arthritis can also effect the facets.  By injecting steroids and local anesthetic in and around these joints, we can temporarily block the nerve supply to them.  Steroids act directly on irritated nerves and tissues to reduce selling and inflammation which often leads to decreased pain.  Facet blocks may be done anywhere along the spine from the neck to the low back depending upon the location of your pain.   After numbing the skin with local anesthetic (like Novocaine), a small needle is passed onto the facet joints under x-ray guidance.  You may experience a sensation of pressure while this is being done.  The entire block usually lasts about 15-25 minutes.   Conditions which may be treated by facet blocks:  Low back/buttock  pain Neck/shoulder pain Certain types of headaches  Preparation for the injection:  Do not eat any solid food or dairy products within 8 hours of your appointment. You may drink clear liquid up to 3 hours before appointment.  Clear liquids include water, black coffee, juice or soda.  No milk or cream please. You may take your regular medication, including pain medications, with a sip of water before your appointment.  Diabetics should hold regular insulin (if taken separately) and take 1/2 normal NPH dose the morning of the procedure.  Carry some sugar containing items with you to your appointment. A driver must accompany you and be prepared to drive you home after your procedure. Bring all your current medications with you. An IV may be inserted and sedation may be given at the discretion of the physician. A blood pressure cuff, EKG and other monitors will often be applied during the procedure.  Some patients may need to have extra oxygen administered for a short period. You will be asked to provide medical information, including your allergies and medications, prior to the procedure.  We must know immediately if you are taking blood thinners (like Coumadin/Warfarin) or if you are allergic to IV iodine contrast (dye).  We must know if you could possible be pregnant.  Possible side-effects:  Bleeding from needle site Infection (rare, may require surgery) Nerve injury (rare) Numbness & tingling (temporary) Difficulty urinating (rare, temporary) Spinal headache (a headache worse with upright posture) Light-headedness (temporary) Pain at injection site (serveral days) Decreased blood pressure (rare, temporary) Weakness in arm/leg (temporary) Pressure sensation in back/neck (temporary)   Call if you experience:  Fever/chills associated with headache or increased back/neck pain Headache worsened by an upright position New onset, weakness or numbness of an extremity below the injection  site Hives or difficulty breathing (go to the emergency room) Inflammation or drainage at the injection site(s) Severe back/neck pain greater than usual New symptoms which are concerning to you  Please note:  Although the local anesthetic injected can often make your back or neck feel good for several hours after the injection, the pain will likely return. It takes 3-7 days for steroids to work.  You may not notice any  pain relief for at least one week.  If effective, we will often do a series of 2-3 injections spaced 3-6 weeks apart to maximally decrease your pain.  After the initial series, you may be a candidate for a more permanent nerve block of the facets.  If you have any questions, please call #[phone] [facility]

## 2021-06-10 ENCOUNTER — Encounter: Payer: Self-pay | Admitting: Student in an Organized Health Care Education/Training Program

## 2021-06-10 ENCOUNTER — Ambulatory Visit
Admission: RE | Admit: 2021-06-10 | Discharge: 2021-06-10 | Disposition: A | Discharge status: Home / Self Care | Payer: Commercial Managed Care - PPO | Source: Ambulatory Visit | Attending: Student in an Organized Health Care Education/Training Program | Admitting: Student in an Organized Health Care Education/Training Program

## 2021-06-10 ENCOUNTER — Other Ambulatory Visit: Payer: Self-pay

## 2021-06-10 ENCOUNTER — Ambulatory Visit (HOSPITAL_BASED_OUTPATIENT_CLINIC_OR_DEPARTMENT_OTHER): Payer: Commercial Managed Care - PPO | Admitting: Student in an Organized Health Care Education/Training Program

## 2021-06-10 DIAGNOSIS — M47819 Spondylosis without myelopathy or radiculopathy, site unspecified: Secondary | ICD-10-CM | POA: Insufficient documentation

## 2021-06-10 DIAGNOSIS — M47812 Spondylosis without myelopathy or radiculopathy, cervical region: Secondary | ICD-10-CM

## 2021-06-10 DIAGNOSIS — G894 Chronic pain syndrome: Secondary | ICD-10-CM

## 2021-06-10 DIAGNOSIS — M542 Cervicalgia: Secondary | ICD-10-CM | POA: Insufficient documentation

## 2021-06-10 MED ORDER — ROPIVACAINE HCL 2 MG/ML IJ SOLN
INTRAMUSCULAR | Status: AC
  Filled 2021-06-10: qty 20

## 2021-06-10 MED ORDER — DIAZEPAM 5 MG PO TABS
5.0000 mg | ORAL_TABLET | ORAL | Status: AC
  Administered 2021-06-10: 5 mg via ORAL

## 2021-06-10 MED ORDER — DEXAMETHASONE SODIUM PHOSPHATE 10 MG/ML IJ SOLN
INTRAMUSCULAR | Status: AC
  Filled 2021-06-10: qty 2

## 2021-06-10 MED ORDER — ROPIVACAINE HCL 2 MG/ML IJ SOLN
9.0000 mL | Freq: Once | INTRAMUSCULAR | Status: AC
  Administered 2021-06-10: 9 mL via PERINEURAL

## 2021-06-10 MED ORDER — DEXAMETHASONE SODIUM PHOSPHATE 10 MG/ML IJ SOLN
10.0000 mg | Freq: Once | INTRAMUSCULAR | Status: AC
  Administered 2021-06-10: 10 mg

## 2021-06-10 MED ORDER — LIDOCAINE HCL 2 % IJ SOLN
INTRAMUSCULAR | Status: AC
  Filled 2021-06-10: qty 10

## 2021-06-10 MED ORDER — DIAZEPAM 5 MG PO TABS
ORAL_TABLET | ORAL | Status: AC
  Filled 2021-06-10: qty 1

## 2021-06-10 MED ORDER — LIDOCAINE HCL 2 % IJ SOLN
20.0000 mL | Freq: Once | INTRAMUSCULAR | Status: AC
  Administered 2021-06-10: 400 mg

## 2021-06-10 NOTE — Progress Notes (Signed)
Safety precautions to be maintained throughout the outpatient stay will include: orient to surroundings, keep bed in low position, maintain call bell within reach at all times, provide assistance with transfer out of bed and ambulation.  

## 2021-06-10 NOTE — Patient Instructions (Signed)
Pain Management Discharge Instructions  General Discharge Instructions :  If you need to reach your doctor call: Monday-Friday 8:00 am - 4:00 pm at [phone] or toll free [phone].  After clinic hours [phone] to have operator reach doctor.  Bring all of your medication bottles to all your appointments in the pain clinic.  To cancel or reschedule your appointment with Pain Management please remember to call 24 hours in advance to avoid a fee.  Refer to the educational materials which you have been given on: General Risks, I had my Procedure. Discharge Instructions, Post Sedation.  Post Procedure Instructions:  The drugs you were given will stay in your system until tomorrow, so for the next 24 hours you should not drive, make any legal decisions or drink any alcoholic beverages.  You may eat anything you prefer, but it is better to start with liquids then soups and crackers, and gradually work up to solid foods.  Please notify your doctor immediately if you have any unusual bleeding, trouble breathing or pain that is not related to your normal pain.  Depending on the type of procedure that was done, some parts of your body may feel week and/or numb.  This usually clears up by tonight or the next day.  Walk with the use of an assistive device or accompanied by an adult for the 24 hours.  You may use ice on the affected area for the first 24 hours.  Put ice in a Ziploc bag and cover with a towel and place against area 15 minutes on 15 minutes off.  You may switch to heat after 24 hours.Facet Blocks Patient Information  Description: The facets are joints in the spine between the vertebrae.  Like any joints in the body, facets can become irritated and painful.  Arthritis can also effect the facets.  By injecting steroids and local anesthetic in and around these joints, we can temporarily block the nerve supply to them.  Steroids act directly on irritated nerves and tissues to  reduce selling and inflammation which often leads to decreased pain.  Facet blocks may be done anywhere along the spine from the neck to the low back depending upon the location of your pain.   After numbing the skin with local anesthetic (like Novocaine), a small needle is passed onto the facet joints under x-ray guidance.  You may experience a sensation of pressure while this is being done.  The entire block usually lasts about 15-25 minutes.   Conditions which may be treated by facet blocks:  Low back/buttock pain Neck/shoulder pain Certain types of headaches  Preparation for the injection:  Do not eat any solid food or dairy products within 8 hours of your appointment. You may drink clear liquid up to 3 hours before appointment.  Clear liquids include water, black coffee, juice or soda.  No milk or cream please. You may take your regular medication, including pain medications, with a sip of water before your appointment.  Diabetics should hold regular insulin (if taken separately) and take 1/2 normal NPH dose the morning of the procedure.  Carry some sugar containing items with you to your appointment. A driver must accompany you and be prepared to drive you home after your procedure. Bring all your current medications with you. An IV may be inserted and sedation may be given at the discretion of the physician. A blood pressure cuff, EKG and other monitors will often be applied during the procedure.  Some patients may need to   have extra oxygen administered for a short period. You will be asked to provide medical information, including your allergies and medications, prior to the procedure.  We must know immediately if you are taking blood thinners (like Coumadin/Warfarin) or if you are allergic to IV iodine contrast (dye).  We must know if you could possible be pregnant.  Possible side-effects:  Bleeding from needle site Infection (rare, may require surgery) Nerve injury (rare) Numbness  & tingling (temporary) Difficulty urinating (rare, temporary) Spinal headache (a headache worse with upright posture) Light-headedness (temporary) Pain at injection site (serveral days) Decreased blood pressure (rare, temporary) Weakness in arm/leg (temporary) Pressure sensation in back/neck (temporary)   Call if you experience:  Fever/chills associated with headache or increased back/neck pain Headache worsened by an upright position New onset, weakness or numbness of an extremity below the injection site Hives or difficulty breathing (go to the emergency room) Inflammation or drainage at the injection site(s) Severe back/neck pain greater than usual New symptoms which are concerning to you  Please note:  Although the local anesthetic injected can often make your back or neck feel good for several hours after the injection, the pain will likely return. It takes 3-7 days for steroids to work.  You may not notice any pain relief for at least one week.  If effective, we will often do a series of 2-3 injections spaced 3-6 weeks apart to maximally decrease your pain.  After the initial series, you may be a candidate for a more permanent nerve block of the facets.  If you have any questions, please call [phone] [facility] 

## 2021-06-10 NOTE — Progress Notes (Signed)
Patient's Name: Sarah Fisher  MRN: [id number]  Referring Provider: Gillis Santa, MD  DOB: [birth year]  PCP: Ma Hillock, DO  DOS: 06/10/2021  Note by: Gillis Santa, MD  Service setting: Ambulatory outpatient  Specialty: Interventional Pain Management  Patient type: Established  Location: [facility] (AMB) Pain Management Facility  Visit type: Interventional Procedure   Primary Reason for Visit: Interventional Pain Management Treatment. CC: Neck Pain  Procedure:       Anesthesia, Analgesia, Anxiolysis:  Type: Cervical Facet Medial Branch Block(s) #2  Primary Purpose: Diagnostic Region: Posterolateral cervical spine Level: C3 ,C4, C5 Medial Branch Level(s). Injecting these levels blocks the  C3-4, C4-5 Laterality: Bilateral Paraspinal  Type: Local Anesthesia with 5 mg PO Valium, minimal sedation Indication(s): Analgesia and Anxiety Sedation: Meaningful verbal contact was maintained at all times during the procedure  Local Anesthetic: Lidocaine 1%   Indications: 1. Chronic pain syndrome   2. Cervical facet joint syndrome    Pain Score: Pre-procedure: 7 /10 Post-procedure: 7 /10  Pre-op Assessment:  Sarah Fisher is a 60 y.o. (year old), adult patient, seen today for interventional treatment. She  has a past surgical history that includes Breast surgery (1986); Hand surgery (Bilateral, 2000 and 2016); cervical facet block (2010); Wisdom tooth extraction (1979); Mass excision (Left, 08/10/2017); I & D extremity (Left, 08/10/2017); and left finger surgery (Left, 08/2017). Sarah Fisher has a current medication list which includes the following prescription(s): boswellia-glucosamine-vit d, cholecalciferol, estradiol, ferrous gluconate-c-folic acid, gabapentin, iron-vitamin c, losartan, multi-vitamin gummies, NON FORMULARY, pravastatin, tizanidine, tramadol hcl, triamcinolone ointment, UNABLE TO FIND, and ibuprofen. Her primarily concern today is the Neck Pain  Initial Vital Signs:  Pulse/HCG Rate:  74ECG Heart Rate: 60 Temp: (!) 97 F (36.1 C) Resp: 16 BP: (!) 163/82 SpO2: 97 %  BMI: Estimated body mass index is 26.37 kg/m as calculated from the following:   Height as of this encounter: 5' (1.524 m).   Weight as of this encounter: 135 lb (61.2 kg).  Risk Assessment: Allergies: Reviewed. She has No Known Allergies.  Allergy Precautions: None required Coagulopathies: Reviewed. None identified.  Blood-thinner therapy: None at this time Active Infection(s): Reviewed. None identified. Sarah Fisher is afebrile  Site Confirmation: Sarah Fisher was asked to confirm the procedure and laterality before marking the site Procedure checklist: Completed Consent: Before the procedure and under the influence of no sedative(s), amnesic(s), or anxiolytics, the patient was informed of the treatment options, risks and possible complications. To fulfill our ethical and legal obligations, as recommended by the American Medical Association's Code of Ethics, I have informed the patient of my clinical impression; the nature and purpose of the treatment or procedure; the risks, benefits, and possible complications of the intervention; the alternatives, including doing nothing; the risk(s) and benefit(s) of the alternative treatment(s) or procedure(s); and the risk(s) and benefit(s) of doing nothing. The patient was provided information about the general risks and possible complications associated with the procedure. These may include, but are not limited to: failure to achieve desired goals, infection, bleeding, organ or nerve damage, allergic reactions, paralysis, and death. In addition, the patient was informed of those risks and complications associated to Spine-related procedures, such as failure to decrease pain; infection (i.e.: Meningitis, epidural or intraspinal abscess); bleeding (i.e.: epidural hematoma, subarachnoid hemorrhage, or any other type of intraspinal or peri-dural bleeding); organ or nerve  damage (i.e.: Any type of peripheral nerve, nerve root, or spinal cord injury) with subsequent damage to sensory, motor, and/or autonomic systems, resulting in  permanent pain, numbness, and/or weakness of one or several areas of the body; allergic reactions; (i.e.: anaphylactic reaction); and/or death. Furthermore, the patient was informed of those risks and complications associated with the medications. These include, but are not limited to: allergic reactions (i.e.: anaphylactic or anaphylactoid reaction(s)); adrenal axis suppression; blood sugar elevation that in diabetics may result in ketoacidosis or comma; water retention that in patients with history of congestive heart failure may result in shortness of breath, pulmonary edema, and decompensation with resultant heart failure; weight gain; swelling or edema; medication-induced neural toxicity; particulate matter embolism and blood vessel occlusion with resultant organ, and/or nervous system infarction; and/or aseptic necrosis of one or more joints. Finally, the patient was informed that Medicine is not an exact science; therefore, there is also the possibility of unforeseen or unpredictable risks and/or possible complications that may result in a catastrophic outcome. The patient indicated having understood very clearly. We have given the patient no guarantees and we have made no promises. Enough time was given to the patient to ask questions, all of which were answered to the patient's satisfaction. Sarah Fisher has indicated that she wanted to continue with the procedure. Attestation: I, the ordering provider, attest that I have discussed with the patient the benefits, risks, side-effects, alternatives, likelihood of achieving goals, and potential problems during recovery for the procedure that I have provided informed consent. Date  Time: 06/10/2021  8:41 AM  Pre-Procedure Preparation:  Monitoring: As per clinic protocol. Respiration, ETCO2, SpO2,  BP, heart rate and rhythm monitor placed and checked for adequate function Safety Precautions: Patient was assessed for positional comfort and pressure points before starting the procedure. Time-out: I initiated and conducted the "Time-out" before starting the procedure, as per protocol. The patient was asked to participate by confirming the accuracy of the "Time Out" information. Verification of the correct person, site, and procedure were performed and confirmed by me, the nursing staff, and the patient. "Time-out" conducted as per Joint Commission's Universal Protocol (UP.01.01.01). Time: 0935  Description of Procedure:       Position: Prone with head of the table raised to facilitate breathing. Laterality: Bilateral. The procedure was performed in identical fashion on both sides. Level:  C3, C4, C5,  Medial Branch Level(s). Area Prepped: Posterior Cervico-thoracic Region Prepping solution: ChloraPrep (2% chlorhexidine gluconate and 70% isopropyl alcohol) Safety Precautions: Aspiration looking for blood return was conducted prior to all injections. At no point did we inject any substances, as a needle was being advanced. Before injecting, the patient was told to immediately notify me if she was experiencing any new onset of "ringing in the ears, or metallic taste in the mouth". No attempts were made at seeking any paresthesias. Safe injection practices and needle disposal techniques used. Medications properly checked for expiration dates. SDV (single dose vial) medications used. After the completion of the procedure, all disposable equipment used was discarded in the proper designated medical waste containers. Local Anesthesia: Protocol guidelines were followed. The patient was positioned over the fluoroscopy table. The area was prepped in the usual manner. The time-out was completed. The target area was identified using fluoroscopy. A 12-in long, straight, sterile hemostat was used with fluoroscopic  guidance to locate the targets for each level blocked. Once located, the skin was marked with an approved surgical skin marker. Once all sites were marked, the skin (epidermis, dermis, and hypodermis), as well as deeper tissues (fat, connective tissue and muscle) were infiltrated with a small amount of a short-acting local  anesthetic, loaded on a 10cc syringe with a 25G, 1.5-in  Needle. An appropriate amount of time was allowed for local anesthetics to take effect before proceeding to the next step. Local Anesthetic: Lidocaine 1.0% The unused portion of the local anesthetic was discarded in the proper designated containers. Technical explanation of process:   C3 Medial Branch Nerve Block (MBB): The target area for the C3 dorsal medial articular branch is the lateral concave waist of the articular pillar of C3. Under fluoroscopic guidance, a Quincke needle was inserted until contact was made with os over the postero-lateral aspect of the articular pillar of C6 (target area). After negative aspiration for blood, 1.55mL of the nerve block solution was injected without difficulty or complication. The needle was removed intact. C4 Medial Branch Nerve Block (MBB): The target area for the C4 dorsal medial articular branch is the lateral concave waist of the articular pillar of C4. Under fluoroscopic guidance, a Quincke needle was inserted until contact was made with os over the postero-lateral aspect of the articular pillar of C4 (target area). After negative aspiration for blood, 1.79mL of the nerve block solution was injected without difficulty or complication. The needle was removed intact. C5 Medial Branch Nerve Block (MBB): The target area for the C5 dorsal medial articular branch is the lateral concave waist of the articular pillar of C5. Under fluoroscopic guidance, a Quincke needle was inserted until contact was made with os over the postero-lateral aspect of the articular pillar of C5 (target area). After  negative aspiration for blood, 1.31mL of the nerve block solution was injected without difficulty or complication. The needle was removed intact.  Procedural Needles: 22-gauge, 3.5-inch, Quincke needles used for all levels. Nerve block solution: 10 cc solution made of 8 cc of 0.2% ropivacaine, 2 cc of Decadron 10 mg/cc.  1.5 cc injected at each level above.  The unused portion of the solution was discarded in the proper designated containers.  Once the entire procedure was completed, the treated area was cleaned, making sure to leave some of the prepping solution back to take advantage of its long term bactericidal properties.  Vitals:   06/10/21 0935 06/10/21 0940 06/10/21 0945 06/10/21 0950  BP: 104/86 (!) 126/93 127/80 130/86  Pulse:      Resp: 17 15 17 14   Temp:      TempSrc:      SpO2: 100% 100% 100% 99%  Weight:      Height:        Start Time: 0935 hrs. End Time: 0950 hrs.  Imaging Guidance (Spinal):  Type of Imaging Technique: Fluoroscopy Guidance (Spinal) Indication(s): Assistance in needle guidance and placement for procedures requiring needle placement in or near specific anatomical locations not easily accessible without such assistance. Exposure Time: Please see nurses notes. Contrast: None used. Fluoroscopic Guidance: I was personally present during the use of fluoroscopy. "Tunnel Vision Technique" used to obtain the best possible view of the target area. Parallax error corrected before commencing the procedure. "Direction-depth-direction" technique used to introduce the needle under continuous pulsed fluoroscopy. Once target was reached, antero-posterior, oblique, and lateral fluoroscopic projection used confirm needle placement in all planes. Images permanently stored in EMR. Interpretation: No contrast injected. I personally interpreted the imaging intraoperatively. Adequate needle placement confirmed in multiple planes. Permanent images saved into the patient's  record.   Post-operative Assessment:  Post-procedure Vital Signs:  Pulse/HCG Rate: 7462 Temp: (!) 97 F (36.1 C) Resp: 14 BP: 130/86 SpO2: 99 %  EBL: None  Complications: No immediate post-treatment complications observed by team, or reported by patient.  Note: The patient tolerated the entire procedure well. A repeat set of vitals were taken after the procedure and the patient was kept under observation following institutional policy, for this type of procedure. Post-procedural neurological assessment was performed, showing return to baseline, prior to discharge. The patient was provided with post-procedure discharge instructions, including a section on how to identify potential problems. Should any problems arise concerning this procedure, the patient was given instructions to immediately contact us, at any time, without hesitation. In any case, we plan to contact the patient by telephone for a follow-up status report regarding this interventional procedure.  Comments:  No additional relevant information. 5 out of 5 strength B/L upper extremity: Shoulder abduction, elbow flexion, elbow extension, thumb extension.  Plan of Care  Imaging Orders         DG PAIN [facility] C-ARM 1-60 MIN NO REPORT    Procedure Orders    No procedure(s) ordered today    Medications ordered for procedure: Meds ordered this encounter  Medications   lidocaine (XYLOCAINE) 2 % (with pres) injection 400 mg   diazepam (VALIUM) tablet 5 mg    Make sure Flumazenil is available in the pyxis when using this medication. If oversedation occurs, administer 0.2 mg IV over 15 sec. If after 45 sec no response, administer 0.2 mg again over 1 min; may repeat at 1 min intervals; not to exceed 4 doses (1 mg)   dexamethasone (DECADRON) injection 10 mg   dexamethasone (DECADRON) injection 10 mg   ropivacaine (PF) 2 mg/mL (0.2%) (NAROPIN) injection 9 mL   ropivacaine (PF) 2 mg/mL (0.2%) (NAROPIN) injection 9 mL   Medications  administered: We administered lidocaine, diazepam, dexamethasone, dexamethasone, ropivacaine (PF) 2 mg/mL (0.2%), and ropivacaine (PF) 2 mg/mL (0.2%).  See the medical record for exact dosing, route, and time of administration.  New Prescriptions   No medications on file   Disposition: Discharge home  Discharge Date & Time: 06/10/2021; 1000 hrs.   Physician-requested Follow-up: Return in about 4 weeks (around 07/08/2021) for Post Procedure Evaluation, virtual.  Future Appointments  Date Time Provider [place]  07/09/2021  3:40 PM Gillis Santa, MD [facility] None   Primary Care Physician: Ma Hillock, DO Location[facility] Note by: Gillis Santa, MD Date: 06/10/2021; Time: 10:35 AM  Disclaimer:  Medicine is not an exact science. The only guarantee in medicine is that nothing is guaranteed. It is important to note that the decision to proceed with this intervention was based on the information collected from the patient. The Data and conclusions were drawn from the patient's questionnaire, the interview, and the physical examination. Because the information was provided in large part by the patient, it cannot be guaranteed that it has not been purposely or unconsciously manipulated. Every effort has been made to obtain as much relevant data as possible for this evaluation. It is important to note that the conclusions that lead to this procedure are derived in large part from the available data. Always take into account that the treatment will also be dependent on availability of resources and existing treatment guidelines, considered by other Pain Management Practitioners as being common knowledge and practice, at the time of the intervention. For Medico-Legal purposes, it is also important to point out that variation in procedural techniques and pharmacological choices are the acceptable norm. The indications, contraindications, technique, and results of the  above procedure should only  be interpreted and judged by a Board-Certified Interventional Pain Specialist with extensive familiarity and expertise in the same exact procedure and technique.

## 2021-06-11 ENCOUNTER — Telehealth: Payer: Self-pay

## 2021-06-11 NOTE — Telephone Encounter (Signed)
Called PP. Denies any needs at this time. Instructed to call if needed. Patient with understanding.

## 2021-07-09 ENCOUNTER — Ambulatory Visit
Payer: Commercial Managed Care - PPO | Attending: Student in an Organized Health Care Education/Training Program | Admitting: Student in an Organized Health Care Education/Training Program

## 2021-07-09 ENCOUNTER — Other Ambulatory Visit: Payer: Self-pay

## 2021-07-09 ENCOUNTER — Encounter: Payer: Self-pay | Admitting: Student in an Organized Health Care Education/Training Program

## 2021-07-09 DIAGNOSIS — G894 Chronic pain syndrome: Secondary | ICD-10-CM

## 2021-07-09 DIAGNOSIS — M47812 Spondylosis without myelopathy or radiculopathy, cervical region: Secondary | ICD-10-CM | POA: Diagnosis not present

## 2021-07-09 DIAGNOSIS — G4486 Cervicogenic headache: Secondary | ICD-10-CM | POA: Diagnosis not present

## 2021-07-09 NOTE — Progress Notes (Signed)
Patient: Sarah Fisher  Service Category: E/M  Provider: Gillis Santa, MD  DOB: [birth year]  DOS: 07/09/2021  Location: Office  MRN: [id number]  Setting: Ambulatory outpatient  Referring Provider: Ma Hillock, DO  Type: Established Patient  Specialty: Interventional Pain Management  PCP: Ma Hillock, DO  Location: Remote location  Delivery: TeleHealth     Virtual Encounter - Pain Management PROVIDER NOTE: Information contained herein reflects review and annotations entered in association with encounter. Interpretation of such information and data should be left to medically-trained personnel. Information provided to patient can be located elsewhere in the medical record under "Patient Instructions". Document created using STT-dictation technology, any transcriptional errors that may result from process are unintentional.    Contact & Pharmacy Preferred: [phone] Home: [phone] (home) Mobile: [phone] (mobile) E-mail: [email or web address], [address] [place] [address] Phone: [phone] Fax: [phone]   Pre-screening  Sarah Fisher offered "in-person" vs "virtual" encounter. She indicated preferring virtual for this encounter.   Reason COVID-19*   Social distancing based on CDC and AMA recommendations.   I contacted Sarah Fisher on 07/09/2021 via telephone.      I clearly identified myself as Gillis Santa, MD. I verified that I was speaking with the correct person using two identifiers (Name: Sarah Fisher, and date of birth: 10-04-1960).  Consent I sought verbal advanced consent from Sarah Fisher for virtual visit interactions. I informed Sarah Fisher of possible security and privacy concerns, risks, and limitations associated with providing "not-in-person" medical evaluation and management services. I also informed Sarah Fisher of the availability of  "in-person" appointments. Finally, I informed her that there would be a charge for the virtual visit and that she could be  personally, fully or partially, financially responsible for it. Ms. Belknap expressed understanding and agreed to proceed.   Historic Elements   Sarah Fisher is a 61 y.o. year old, adult patient evaluated today after our last contact on 06/10/2021. Sarah Fisher  has a past medical history of Anemia, Arthritis, Cervical spine degeneration (2010), History of carpal tunnel syndrome, Hyperlipidemia, Hypertension, Sacroiliac joint pain (2010), Scoliosis, and Skin cancer of eyelid. She also  has a past surgical history that includes Breast surgery (1986); Hand surgery (Bilateral, 2000 and 2016); cervical facet block (2010); Wisdom tooth extraction (1979); Mass excision (Left, 08/10/2017); I & D extremity (Left, 08/10/2017); and left finger surgery (Left, 08/2017). Sarah Fisher has a current medication list which includes the following prescription(s): boswellia-glucosamine-vit d, cholecalciferol, estradiol, ferrous gluconate-c-folic acid, gabapentin, iron-vitamin c, losartan, multi-vitamin gummies, NON FORMULARY, pravastatin, tizanidine, tramadol hcl, triamcinolone ointment, UNABLE TO FIND, and ibuprofen. She  reports that she has never smoked. She has never used smokeless tobacco. She reports current alcohol use. She reports that she does not use drugs. Sarah Fisher has No Known Allergies.   HPI  Today, she is being contacted for a post-procedure assessment.   Post-procedure evaluation  Type: Cervical Facet Medial Branch Block(s) #2  Primary Purpose: Diagnostic Region: Posterolateral cervical spine Level: C3 ,C4, C5 Medial Branch Level(s). Injecting these levels blocks the  C3-4, C4-5 Laterality: Bilateral Paraspinal  Effectiveness:  Initial hour after procedure: 100 %  Subsequent 4-6 hours post-procedure: 100 %  Analgesia past initial 6 hours: 35 % (lasting 2.5 weeks)  Ongoing  improvement:  Analgesic:  25%-30%, however pain returning, is more focal Function: Back to baseline  ROM: Back to baseline   Laboratory Chemistry Profile   Renal Lab Results  Component Value Date   BUN 18 09/04/2020   CREATININE 0.72 16/04/9603   BCR NOT APPLICABLE 54/03/8118   GFR 103.25 06/15/2018   GFRAA 90 05/04/2020   GFRNONAA 90 05/04/2020    Hepatic Lab Results  Component Value Date   AST 21 09/04/2020   ALT 17 09/04/2020   ALBUMIN 4.3 05/04/2020   ALKPHOS 52 05/04/2020   HCVAB NEGATIVE 05/15/2016    Electrolytes Lab Results  Component Value Date   NA 140 09/04/2020   K 4.4 09/04/2020   CL 106 09/04/2020   CALCIUM 9.1 09/04/2020   MG 2.2 10/01/2016    Bone Lab Results  Component Value Date   VD25OH 33.27 03/28/2021   25OHVITD1 40 10/01/2016   25OHVITD2 18 10/01/2016   25OHVITD3 22 10/01/2016    Inflammation (CRP: Acute Phase) (ESR: Chronic Phase) Lab Results  Component Value Date   CRP 0.8 10/18/2020   ESRSEDRATE 2 10/18/2020         Note: Above Lab results reviewed.   Imaging: Narrative & Impression  CLINICAL DATA:  Recurring neck pain radiating into both shoulders and arms for 14 years. Occasional tingling and numbness in both hands. No acute injury or prior relevant surgery.   EXAM: MRI CERVICAL SPINE WITHOUT CONTRAST   TECHNIQUE: Multiplanar, multisequence MR imaging of the cervical spine was performed. No intravenous contrast was administered.   COMPARISON:  Radiographs 10/01/2016.  Cervical MRI 10/21/2007.   FINDINGS: Alignment: Straightening without angulation or listhesis.   Vertebrae: No acute or suspicious osseous findings.   Cord: Normal in signal and caliber.   Posterior Fossa, vertebral arteries, paraspinal tissues: Visualized portions of the posterior fossa and paraspinal soft tissues appear unremarkable. Bilateral vertebral artery flow voids. The right vertebral artery is small, but appears unchanged.   Disc  levels:   C2-3: The disc appears normal. Asymmetric facet hypertrophy on the left without resulting spinal stenosis.   C3-4: The disc appears normal. There is left-greater-than-right facet hypertrophy without significant foraminal compromise.   C4-5: The disc appears normal. Mild bilateral facet hypertrophy. No spinal stenosis or nerve root encroachment.   C5-6: Mildly progressive loss of disc height with endplate osteophytes covering diffusely bulging disc material. The AP diameter of the canal is 9 mm. There is no cord deformity. There is a left foraminal disc osteophyte complex which may contribute to left C6 nerve root encroachment. The right foramen appears only mildly narrowed.   C6-7: Mild disc bulging and uncinate spurring. The left vertebral artery is tortuous, extending into the left foramen. No cord deformity.   C7-T1: The disc appears normal. Mild bilateral facet hypertrophy. No spinal stenosis or nerve root encroachment.   IMPRESSION: 1. Progressive spondylosis at C5-6 compared with previous MRI from 2009. Left foraminal disc osteophyte complex may contribute to left C6 nerve root encroachment. 2. Dominant left vertebral artery is tortuous, extending into left foramen at C6-7 (and to a lesser degree at C5-6). This could contribute to left-sided nerve root encroachment. 3. Mild spinal stenosis at C5-6 without cord deformity. No abnormal cord signal.     Electronically Signed   By: Richardean Sale M.D.   On: 07/06/2017 13:59   Assessment  The primary encounter diagnosis was Cervical facet joint syndrome. Diagnoses of Cervicogenic headache (Bilateral) (R>L) and Chronic pain syndrome were also pertinent to this visit.  Plan of Care  Based upon above results 2 diagnostic cervical facet  medial branch nerve block, recommend repeating and assessing results.  Patient has similar or better response to her second set of diagnostic cervical facet medial branch nerve  blocks at C3, C4, C5 bilaterally can further discuss cervical radiofrequency ablation for the purpose of providing longer-term pain relief.  Risks and benefits reviewed and patient would like to proceed with second set of cervical facet medial branch nerve blocks.  Orders:  Orders Placed This Encounter  Procedures   CERVICAL FACET (MEDIAL BRANCH NERVE BLOCK)     Standing Status:   Future    Standing Expiration Date:   08/09/2021    Scheduling Instructions:     Side: Bilateral     Level: C3-4, C4-5, Facet joints (C3, C4, C5, Medial Branch Nerves)     Sedation: PO Valium, 10 mg     Timeframe: As soon as schedule allows    Order Specific Question:   Where will this procedure be performed?    Answer:   [facility]   Follow-up plan:   Return in about 2 weeks (around 07/23/2021) for B/L C3, 4, 5 Fcts #2, 10 mg PO Valium, minimal sedation (PO Valium).    Recent Visits Date Type Provider Dept  06/10/21 Procedure visit Gillis Santa, MD [facility]  06/04/21 Office Visit Gillis Santa, MD [facility]  Showing recent visits within past 90 days and meeting all other requirements Today's Visits Date Type Provider Dept  07/09/21 Office Visit Gillis Santa, MD [facility]  Showing today's visits and meeting all other requirements Future Appointments No visits were found meeting these conditions. Showing future appointments within next 90 days and meeting all other requirements  I discussed the assessment and treatment plan with the patient. The patient was provided an opportunity to ask questions and all were answered. The patient agreed with the plan and demonstrated an understanding of the instructions.  Patient advised to call back or seek an in-person evaluation if the symptoms or condition worsens.  Duration of encounter: 54mnutes.  Note by: BGillis Santa MD Date: 07/09/2021; Time: 10:52 AM

## 2021-07-09 NOTE — Patient Instructions (Signed)
______________________________________________________________________  Preparing for Procedure with Sedation  NOTICE: Due to recent regulatory changes, starting on February 04, 2021, procedures requiring intravenous (IV) sedation will no longer be performed at the [facility].  These types of procedures are required to be performed at [facility] ambulatory surgery facility.  We are very sorry for the inconvenience.  Procedure appointments are limited to planned procedures: No Prescription Refills. No disability issues will be discussed. No medication changes will be discussed.  Instructions: Oral Intake: Do not eat or drink anything for at least 8 hours prior to your procedure. (Exception: Blood Pressure Medication. See below.) Transportation: A driver is required. You may not drive yourself after the procedure. Blood Pressure Medicine: Do not forget to take your blood pressure medicine with a sip of water the morning of the procedure. If your Diastolic (lower reading) is above 100 mmHg, elective cases will be cancelled/rescheduled. Blood thinners: These will need to be stopped for procedures. Notify our staff if you are taking any blood thinners. Depending on which one you take, there will be specific instructions on how and when to stop it. Diabetics on insulin: Notify the staff so that you can be scheduled 1st case in the morning. If your diabetes requires high dose insulin, take only  of your normal insulin dose the morning of the procedure and notify the staff that you have done so. Preventing infections: Shower with an antibacterial soap the morning of your procedure. Build-up your immune system: Take 1000 mg of Vitamin C with every meal (3 times a day) the day prior to your procedure. Antibiotics: Inform the staff if you have a condition or reason that requires you to take antibiotics before dental procedures. Pregnancy: If you are pregnant, call and cancel the procedure. Sickness: If  you have a cold, fever, or any active infections, call and cancel the procedure. Arrival: You must be in the facility at least 30 minutes prior to your scheduled procedure. Children: Do not bring children with you. Dress appropriately: Bring dark clothing that you would not mind if they get stained. Valuables: Do not bring any jewelry or valuables.  Reasons to call and reschedule or cancel your procedure: (Following these recommendations will minimize the risk of a serious complication.) Surgeries: Avoid having procedures within 2 weeks of any surgery. (Avoid for 2 weeks before or after any surgery). Flu Shots: Avoid having procedures within 2 weeks of a flu shots. (Avoid for 2 weeks before or after immunizations). Barium: Avoid having a procedure within 7-10 days after having had a radiological study involving the use of radiological contrast. (Myelograms, Barium swallow or enema study). Heart attacks: Avoid any elective procedures or surgeries for the initial 6 months after a "Myocardial Infarction" (Heart Attack). Blood thinners: It is imperative that you stop these medications before procedures. Let us know if you if you take any blood thinner.  Infection: Avoid procedures during or within two weeks of an infection (including chest colds or gastrointestinal problems). Symptoms associated with infections include: Localized redness, fever, chills, night sweats or profuse sweating, burning sensation when voiding, cough, congestion, stuffiness, runny nose, sore throat, diarrhea, nausea, vomiting, cold or Flu symptoms, recent or current infections. It is specially important if the infection is over the area that we intend to treat. Heart and lung problems: Symptoms that may suggest an active cardiopulmonary problem include: cough, chest pain, breathing difficulties or shortness of breath, dizziness, ankle swelling, uncontrolled high or unusually low blood pressure, and/or palpitations. If you are    experiencing any of these symptoms, cancel your procedure and contact your primary care physician for an evaluation.  Remember:  Regular Business hours are:  Monday to Thursday 8:00 AM to 4:00 PM  Provider's Schedule: Francisco Gahan, MD:  Procedure days: Tuesday and Thursday 7:30 AM to 4:00 PM  Bilal Lateef, MD:  Procedure days: Monday and Wednesday 7:30 AM to 4:00 PM ______________________________________________________________________   

## 2021-07-18 ENCOUNTER — Telehealth: Payer: Self-pay

## 2021-07-18 NOTE — Telephone Encounter (Signed)
Her insurance denied auth for the Cervical facets.I called and left her a vm letting her know I cancelled the appt monday

## 2021-07-19 NOTE — Telephone Encounter (Signed)
Peer to Peer scheduled for Monday 1/16 at 345pm they will call your cell phone

## 2021-07-22 ENCOUNTER — Ambulatory Visit: Payer: Commercial Managed Care - PPO | Admitting: Student in an Organized Health Care Education/Training Program

## 2021-07-23 ENCOUNTER — Telehealth: Payer: Self-pay

## 2021-07-23 DIAGNOSIS — M542 Cervicalgia: Secondary | ICD-10-CM

## 2021-07-23 DIAGNOSIS — M47812 Spondylosis without myelopathy or radiculopathy, cervical region: Secondary | ICD-10-CM

## 2021-07-23 DIAGNOSIS — M5030 Other cervical disc degeneration, unspecified cervical region: Secondary | ICD-10-CM

## 2021-07-23 NOTE — Telephone Encounter (Signed)
Since insurance denied the facets she wants to know what can be done from here.

## 2021-08-06 ENCOUNTER — Other Ambulatory Visit: Payer: Self-pay

## 2021-08-06 ENCOUNTER — Encounter: Payer: Self-pay | Admitting: Student in an Organized Health Care Education/Training Program

## 2021-08-06 ENCOUNTER — Ambulatory Visit
Payer: BC Managed Care – PPO | Attending: Student in an Organized Health Care Education/Training Program | Admitting: Student in an Organized Health Care Education/Training Program

## 2021-08-06 VITALS — BP 152/103 | HR 66 | Temp 97.2°F | Ht 60.0 in | Wt 136.0 lb

## 2021-08-06 DIAGNOSIS — G894 Chronic pain syndrome: Secondary | ICD-10-CM | POA: Diagnosis not present

## 2021-08-06 DIAGNOSIS — M5030 Other cervical disc degeneration, unspecified cervical region: Secondary | ICD-10-CM | POA: Diagnosis not present

## 2021-08-06 DIAGNOSIS — M542 Cervicalgia: Secondary | ICD-10-CM | POA: Insufficient documentation

## 2021-08-06 DIAGNOSIS — M47812 Spondylosis without myelopathy or radiculopathy, cervical region: Secondary | ICD-10-CM | POA: Diagnosis not present

## 2021-08-06 DIAGNOSIS — G4486 Cervicogenic headache: Secondary | ICD-10-CM | POA: Insufficient documentation

## 2021-08-06 DIAGNOSIS — M7532 Calcific tendinitis of left shoulder: Secondary | ICD-10-CM | POA: Diagnosis not present

## 2021-08-06 DIAGNOSIS — M67912 Unspecified disorder of synovium and tendon, left shoulder: Secondary | ICD-10-CM | POA: Diagnosis not present

## 2021-08-06 MED ORDER — PREGABALIN 25 MG PO CAPS
ORAL_CAPSULE | ORAL | 2 refills | Status: DC
Start: 2021-08-06 — End: 2021-10-02

## 2021-08-06 NOTE — Progress Notes (Signed)
Safety precautions to be maintained throughout the outpatient stay will include: orient to surroundings, keep bed in low position, maintain call bell within reach at all times, provide assistance with transfer out of bed and ambulation.  

## 2021-08-06 NOTE — Progress Notes (Signed)
PROVIDER NOTE: Information contained herein reflects review and annotations entered in association with encounter. Interpretation of such information and data should be left to medically-trained personnel. Information provided to patient can be located elsewhere in the medical record under "Patient Instructions". Document created using STT-dictation technology, any transcriptional errors that may result from process are unintentional.    Patient: Sarah Fisher  Service Category: E/M  Provider: Gillis Santa, MD  DOB: [birth year]  DOS: 08/06/2021  Specialty: Interventional Pain Management  MRN: [id number]  Setting: Ambulatory outpatient  PCP: Ma Hillock, DO  Type: Established Patient    Referring Provider: Ma Hillock, DO  Location: Office  Delivery: Face-to-face     HPI  [name], a 61 y.o. year old adult, is here today because of her DDD (degenerative disc disease), cervical [M50.30]. Ms. Ozga primary complain today is Neck Pain and Hand Pain (Bilat,  right is worse) Last encounter: My last encounter with her was on 07/09/2021. Pertinent problems: Ms. Wolden has Chronic pain syndrome; Cervical DDD (degenerative disc disease) (C5-6); Cervical central spinal stenosis C5-6 (Right); Chronic neck pain (Bilateral) (L>R); Cervicogenic headache (Bilateral) (R>L); Occipital headache (Bilateral) (R>L); Neurogenic pain; Chronic shoulder pain (Bilateral) (L>R); Chronic upper back pain (Bilateral) (L>R); and Cervical spondylosis on their pertinent problem list. Pain Assessment: Severity of Chronic pain is reported as a 5 /10. Location: Neck (Also, c/o pain in wrists bilat, right hand worse. Has recently noticed in am when pt wakes up, right hand is numb and left fingers tingle.Pt. states she feels like she is losing strength in both hands, especially right hand.) Right, Left, Mid/to shoulders bilat, left side worse. Onset: More than a month ago. Quality: Aching, Throbbing, Sharp. Timing:  Constant. Modifying factor(s): heat. Vitals:  height is 5' (1.524 m) and weight is 136 lb (61.7 kg). Her temporal temperature is 97.2 F (36.2 C) (abnormal). Her blood pressure is 152/103 (abnormal) and her pulse is 66. Her oxygen saturation is 100%.   Reason for encounter:   Persistent neck pain worse on the left side with associated shoulder pain as well as trapezius spasms.  Unfortunately, [employer] denied her cervical facet medial branch nerve block injection #2 given after a peer to peer because she had not met the 80% analgesic target metric.  We discussed additional therapeutic options for her.  We discussed a left-sided trigger point injection which could be helpful for her cervicalgia.  Of note patient states that she has been on gabapentin for many years with limited analgesic benefit.  We discussed rotation to Lyrica to see if that has a better response for her.  Risk and benefits of pregabalin were discussed with patient and she would like to proceed.  We will also get her scheduled for a cervical trigger point injection.  I also recommend that she trial a TENS unit in her neck and shoulder region.  ROS  Constitutional: Denies any fever or chills Gastrointestinal: No reported hemesis, hematochezia, vomiting, or acute GI distress Musculoskeletal:  Cervicalgia, left cervical paraspinal pain tender to palpation Neurological: No reported episodes of acute onset apraxia, aphasia, dysarthria, agnosia, amnesia, paralysis, loss of coordination, or loss of consciousness  Medication Review  Boswellia-Glucosamine-Vit D, Cholecalciferol, Collagen, Ferrous Gluconate-C-Folic Acid, Iron-Vitamin C, Multi-Vitamin Gummies, NON FORMULARY, UNABLE TO FIND, estradiol, ibuprofen, losartan, pravastatin, pregabalin, tiZANidine, and triamcinolone ointment  History Review  Allergy: Ms. Aguinaga has No Known Allergies. Drug: Ms. Stahle  reports no history of drug use. Alcohol:  reports  current  alcohol use. Tobacco:  reports that she has never smoked. She has never used smokeless tobacco. Social: Ms. Achorn  reports that she has never smoked. She has never used smokeless tobacco. She reports current alcohol use. She reports that she does not use drugs. Medical:  has a past medical history of Anemia, Arthritis, Cervical spine degeneration (2010), History of carpal tunnel syndrome, Hyperlipidemia, Hypertension, Sacroiliac joint pain (2010), Scoliosis, and Skin cancer of eyelid. Surgical: Ms. Klar  has a past surgical history that includes Breast surgery (1986); Hand surgery (Bilateral, 2000 and 2016); cervical facet block (2010); Wisdom tooth extraction (1979); Mass excision (Left, 08/10/2017); I & D extremity (Left, 08/10/2017); and left finger surgery (Left, 08/2017). Family: family history includes Alzheimer's disease in her maternal grandfather and paternal grandmother; Arthritis in her maternal aunt and mother; Colon cancer (age of onset: 36) in her maternal grandfather; Diabetes in her brother and maternal grandfather; Hypertension in her brother, father, maternal grandmother, mother, and sister; Kidney cancer in her maternal grandmother; Liver cancer in her father.  Laboratory Chemistry Profile   Renal Lab Results  Component Value Date   BUN 18 09/04/2020   CREATININE 0.72 38/04/1750   BCR NOT APPLICABLE 02/58/5277   GFR 103.25 06/15/2018   GFRAA 90 05/04/2020   GFRNONAA 90 05/04/2020    Hepatic Lab Results  Component Value Date   AST 21 09/04/2020   ALT 17 09/04/2020   ALBUMIN 4.3 05/04/2020   ALKPHOS 52 05/04/2020   HCVAB NEGATIVE 05/15/2016    Electrolytes Lab Results  Component Value Date   NA 140 09/04/2020   K 4.4 09/04/2020   CL 106 09/04/2020   CALCIUM 9.1 09/04/2020   MG 2.2 10/01/2016    Bone Lab Results  Component Value Date   VD25OH 33.27 03/28/2021   25OHVITD1 40 10/01/2016   25OHVITD2 18 10/01/2016   25OHVITD3 22 10/01/2016    Inflammation (CRP:  Acute Phase) (ESR: Chronic Phase) Lab Results  Component Value Date   CRP 0.8 10/18/2020   ESRSEDRATE 2 10/18/2020         Note: Above Lab results reviewed.   Physical Exam  General appearance: Well nourished, well developed, and well hydrated. In no apparent acute distress Mental status: Alert, oriented x 3 (person, place, & time)       Respiratory: No evidence of acute respiratory distress Eyes: PERLA Vitals: BP (!) 152/103    Pulse 66    Temp (!) 97.2 F (36.2 C) (Temporal)    Ht 5' (1.524 m)    Wt 136 lb (61.7 kg)    LMP  (LMP Unknown)    SpO2 100%    BMI 26.56 kg/m  BMI: Estimated body mass index is 26.56 kg/m as calculated from the following:   Height as of this encounter: 5' (1.524 m).   Weight as of this encounter: 136 lb (61.7 kg). Ideal: Unrecognized patient sex  Cervical Spine Area Exam  Skin & Axial Inspection: No masses, redness, edema, swelling, or associated skin lesions Alignment: Symmetrical Functional ROM: Pain restricted ROM      Stability: No instability detected Muscle Tone/Strength: Functionally intact. No obvious neuro-muscular anomalies detected. Sensory (Neurological): Musculoskeletal pain pattern and neurogenic Palpation: Complains of area being tender to palpation              5 out of 5 strength bilateral upper extremity: Shoulder abduction, elbow flexion, elbow extension, thumb extension.   Assessment   Status Diagnosis  Having a Flare-up Persistent Persistent  1. Cervical DDD (degenerative disc disease) (C5-6)   2. Cervical facet joint syndrome   3. Cervicalgia   4. Cervicogenic headache (Bilateral) (R>L)   5. Calcific tendinitis of shoulder (Left)   6. Calcific Tendinopathy of Shoulder (Left)   7. Chronic pain syndrome      Updated Problems: Problem  Chronic Pain Syndrome  Cervical DDD (degenerative disc disease) (C5-6)  Cervical central spinal stenosis C5-6 (Right)  Chronic neck pain (Bilateral) (L>R)  Cervicogenic headache  (Bilateral) (R>L)  Occipital headache (Bilateral) (R>L)  Neurogenic Pain  Chronic shoulder pain (Bilateral) (L>R)  Chronic upper back pain (Bilateral) (L>R)  Cervical spondylosis    Plan of Care   [name] has a current medication list which includes the following long-term medication(s): losartan, pravastatin, and pregabalin.  Pharmacotherapy (Medications Ordered): Meds ordered this encounter  Medications   pregabalin (LYRICA) 25 MG capsule    Sig: 25 mg during the day, 50 mg qhs    Dispense:  90 capsule    Refill:  2   Orders:  Orders Placed This Encounter  Procedures   TRIGGER POINT INJECTION    Standing Status:   Future    Standing Expiration Date:   11/03/2021    Scheduling Instructions:     Cervicalgia     Bilateral cervical/trapezius    Order Specific Question:   Where will this procedure be performed?    Answer:   [facility]   Follow-up plan:   Return in about 2 weeks (around 08/20/2021) for cervical TPI.    Recent Visits Date Type Provider Dept  07/09/21 Office Visit Gillis Santa, MD [facility]  06/10/21 Procedure visit Gillis Santa, MD [facility]  06/04/21 Office Visit Gillis Santa, MD [facility]  Showing recent visits within past 90 days and meeting all other requirements Today's Visits Date Type Provider Dept  08/06/21 Office Visit Gillis Santa, MD [facility]  Showing today's visits and meeting all other requirements Future Appointments Date Type Provider Dept  09/02/21 Appointment Gillis Santa, MD [facility]  Showing future appointments within next 90 days and meeting all other requirements  I discussed the assessment and treatment plan with the patient. The patient was provided an opportunity to ask questions and all were answered. The patient agreed with the plan and demonstrated an understanding of the instructions.  Patient advised to call back or seek an in-person  evaluation if the symptoms or condition worsens.  Duration of encounter: 46mnutes.  Note by: BGillis Santa MD Date: 08/06/2021; Time: 1:54 PM

## 2021-08-06 NOTE — Progress Notes (Signed)
Nursing Pain Medication Assessment:  Safety precautions to be maintained throughout the outpatient stay will include: orient to surroundings, keep bed in low position, maintain call bell within reach at all times, provide assistance with transfer out of bed and ambulation.  Medication Inspection Compliance: Sarah Fisher did not comply with our request to bring her pills to be counted. She was reminded that bringing the medication bottles, even when empty, is a requirement.  Medication: None brought in. Pill/Patch Count: None available to be counted. Bottle Appearance: No container available. Did not bring bottle(s) to appointment. Filled Date: N/A Last Medication intake:   Pt states she no longer takes Tramadol/ that she does not like the way it makes her feel.

## 2021-09-02 ENCOUNTER — Ambulatory Visit: Payer: BC Managed Care – PPO | Admitting: Student in an Organized Health Care Education/Training Program

## 2021-09-04 ENCOUNTER — Encounter: Payer: Self-pay | Admitting: Student in an Organized Health Care Education/Training Program

## 2021-09-04 ENCOUNTER — Ambulatory Visit
Payer: BC Managed Care – PPO | Attending: Student in an Organized Health Care Education/Training Program | Admitting: Student in an Organized Health Care Education/Training Program

## 2021-09-04 ENCOUNTER — Other Ambulatory Visit: Payer: Self-pay

## 2021-09-04 VITALS — BP 122/87 | HR 67 | Temp 97.9°F | Resp 16 | Wt 136.0 lb

## 2021-09-04 DIAGNOSIS — G4486 Cervicogenic headache: Secondary | ICD-10-CM | POA: Insufficient documentation

## 2021-09-04 DIAGNOSIS — M5030 Other cervical disc degeneration, unspecified cervical region: Secondary | ICD-10-CM | POA: Diagnosis not present

## 2021-09-04 DIAGNOSIS — M542 Cervicalgia: Secondary | ICD-10-CM | POA: Insufficient documentation

## 2021-09-04 DIAGNOSIS — G894 Chronic pain syndrome: Secondary | ICD-10-CM | POA: Diagnosis not present

## 2021-09-04 DIAGNOSIS — M47812 Spondylosis without myelopathy or radiculopathy, cervical region: Secondary | ICD-10-CM | POA: Diagnosis not present

## 2021-09-04 MED ORDER — ROPIVACAINE HCL 2 MG/ML IJ SOLN
INTRAMUSCULAR | Status: AC
  Filled 2021-09-04: qty 20

## 2021-09-04 MED ORDER — TIZANIDINE HCL 4 MG PO TABS: 4.0000 mg | ORAL_TABLET | Freq: Every evening | ORAL | 2 refills | Status: AC | PRN

## 2021-09-04 MED ORDER — LIDOCAINE HCL 2 % IJ SOLN
INTRAMUSCULAR | Status: AC
  Filled 2021-09-04: qty 20

## 2021-09-04 MED ORDER — DEXAMETHASONE SODIUM PHOSPHATE 10 MG/ML IJ SOLN
INTRAMUSCULAR | Status: AC
  Filled 2021-09-04: qty 1

## 2021-09-04 MED ORDER — ROPIVACAINE HCL 2 MG/ML IJ SOLN
9.0000 mL | Freq: Once | INTRAMUSCULAR | Status: AC
  Administered 2021-09-04: 9 mL via PERINEURAL

## 2021-09-04 NOTE — Progress Notes (Signed)
PROVIDER NOTE: Interpretation of information contained herein should be left to medically-trained personnel. Specific patient instructions are provided elsewhere under "Patient Instructions" section of medical record. This document was created in part using STT-dictation technology, any transcriptional errors that may result from this process are unintentional.  Patient: Sarah Fisher Type: Established DOB: December 14, 1960 MRN: [id number] PCP: Ma Hillock, DO  Service: Procedure DOS: 09/04/2021 Setting: Ambulatory Location: Ambulatory outpatient facility Delivery: Face-to-face Provider: Gillis Santa, MD Specialty: Interventional Pain Management Specialty designation: 09 Location: Outpatient facility Ref. Prov.: Raoul Pitch, Renee A, DO    Primary Reason for Visit: Interventional Pain Management Treatment. CC: Neck Pain (Bilateral today right is worse )    Procedure:          Type: Cervical, Trapezius, posterior deltoid, levator scapula Trigger Point Injection (3+muscle groups)          CPT: 01601 Myofascial pain syndrome   1. Cervical DDD (degenerative disc disease) (C5-6)   2. Cervicalgia   3. Cervicogenic headache (Bilateral) (R>L)   4. Chronic pain syndrome   5. Cervical facet joint syndrome    NAS-11 Pain score:   Pre-procedure: 5 /10   Post-procedure: 5 /10     Pre-op H&P Assessment:  Sarah Fisher is a 61 y.o. (year old), adult patient, seen today for interventional treatment. She  has a past surgical history that includes Breast surgery (1986); Hand surgery (Bilateral, 2000 and 2016); cervical facet block (2010); Wisdom tooth extraction (1979); Mass excision (Left, 08/10/2017); I & D extremity (Left, 08/10/2017); and left finger surgery (Left, 08/2017). Sarah Fisher has a current medication list which includes the following prescription(s): boswellia-glucosamine-vit d, cholecalciferol, collagen, estradiol, ferrous gluconate-c-folic acid, ibuprofen, losartan, multi-vitamin gummies, NON  FORMULARY, pravastatin, pregabalin, tramadol, triamcinolone ointment, iron-vitamin c, tizanidine, and UNABLE TO FIND. Her primarily concern today is the Neck Pain (Bilateral today right is worse )  Initial Vital Signs:  Pulse/HCG Rate: 67  Temp: 97.9 F (36.6 C) Resp: 16 BP: 122/87 SpO2: 100 %  BMI: Estimated body mass index is 26.56 kg/m as calculated from the following:   Height as of 08/06/21: 5' (1.524 m).   Weight as of this encounter: 136 lb (61.7 kg).  Risk Assessment: Allergies: Reviewed. She has No Known Allergies.  Allergy Precautions: None required Coagulopathies: Reviewed. None identified.  Blood-thinner therapy: None at this time Active Infection(s): Reviewed. None identified. Sarah Fisher is afebrile  Site Confirmation: Sarah Fisher was asked to confirm the procedure and laterality before marking the site Procedure checklist: Completed Consent: Before the procedure and under the influence of no sedative(s), amnesic(s), or anxiolytics, the patient was informed of the treatment options, risks and possible complications. To fulfill our ethical and legal obligations, as recommended by the American Medical Association's Code of Ethics, I have informed the patient of my clinical impression; the nature and purpose of the treatment or procedure; the risks, benefits, and possible complications of the intervention; the alternatives, including doing nothing; the risk(s) and benefit(s) of the alternative treatment(s) or procedure(s); and the risk(s) and benefit(s) of doing nothing. The patient was provided information about the general risks and possible complications associated with the procedure. These may include, but are not limited to: failure to achieve desired goals, infection, bleeding, organ or nerve damage, allergic reactions, paralysis, and death. In addition, the patient was informed of those risks and complications associated to the procedure, such as failure to decrease pain;  infection; bleeding; organ or nerve damage with subsequent damage to sensory, motor, and/or autonomic  systems, resulting in permanent pain, numbness, and/or weakness of one or several areas of the body; allergic reactions; (i.e.: anaphylactic reaction); and/or death. Furthermore, the patient was informed of those risks and complications associated with the medications. These include, but are not limited to: allergic reactions (i.e.: anaphylactic or anaphylactoid reaction(s)); adrenal axis suppression; blood sugar elevation that in diabetics may result in ketoacidosis or comma; water retention that in patients with history of congestive heart failure may result in shortness of breath, pulmonary edema, and decompensation with resultant heart failure; weight gain; swelling or edema; medication-induced neural toxicity; particulate matter embolism and blood vessel occlusion with resultant organ, and/or nervous system infarction; and/or aseptic necrosis of one or more joints. Finally, the patient was informed that Medicine is not an exact science; therefore, there is also the possibility of unforeseen or unpredictable risks and/or possible complications that may result in a catastrophic outcome. The patient indicated having understood very clearly. We have given the patient no guarantees and we have made no promises. Enough time was given to the patient to ask questions, all of which were answered to the patient's satisfaction. Sarah Fisher has indicated that she wanted to continue with the procedure. Attestation: I, the ordering provider, attest that I have discussed with the patient the benefits, risks, side-effects, alternatives, likelihood of achieving goals, and potential problems during recovery for the procedure that I have provided informed consent. Date   Time: 09/04/2021  9:29 AM  Pre-Procedure Preparation:  Monitoring: As per clinic protocol. Respiration, ETCO2, SpO2, BP, heart rate and rhythm monitor  placed and checked for adequate function Safety Precautions: Patient was assessed for positional comfort and pressure points before starting the procedure. Time-out: I initiated and conducted the "Time-out" before starting the procedure, as per protocol. The patient was asked to participate by confirming the accuracy of the "Time Out" information. Verification of the correct person, site, and procedure were performed and confirmed by me, the nursing staff, and the patient. "Time-out" conducted as per Joint Commission's Universal Protocol (UP.01.01.01). Time: 0956  Description of Procedure:          Area Prepped: Entire             Region DuraPrep (Iodine Povacrylex [0.7% available iodine] and Isopropyl Alcohol, 74% w/w) Safety Precautions: Aspiration looking for blood return was conducted prior to all injections. At no point did we inject any substances, as a needle was being advanced. No attempts were made at seeking any paresthesias. Safe injection practices and needle disposal techniques used. Medications properly checked for expiration dates. SDV (single dose vial) medications used. Description of the Procedure: Protocol guidelines were followed. The patient was placed in position over the fluoroscopy table. The target area was identified and the area prepped in the usual manner. Skin & deeper tissues infiltrated with local anesthetic. Appropriate amount of time allowed to pass for local anesthetics to take effect. The procedure needles were then advanced to the target area. Proper needle placement secured. Negative aspiration confirmed. Solution injected in intermittent fashion, asking for systemic symptoms every 0.5cc of injectate. The needles were then removed and the area cleansed, making sure to leave some of the prepping solution back to take advantage of its long term bactericidal properties.  Vitals:   09/04/21 0933  BP: 122/87  Pulse: 67  Resp: 16  Temp: 97.9 F (36.6 C)  TempSrc:  Temporal  SpO2: 100%  Weight: 136 lb (61.7 kg)    Start Time: 0956 hrs. End Time: 1008 hrs. Materials:  Needle(s) Type: Epidural needle Gauge: 20G Length: 3.5-in Medication(s): Please see orders for medications and dosing details.  Approximately 18 trigger point injected with 0.5 to 1 cc of 0.2% ropivacaine with needling performed.  Muscle groups include cervical paraspinal muscles including trapezius, levator scapulae, posterior deltoid  Post-operative Assessment:  Post-procedure Vital Signs:  Pulse/HCG Rate: 67  Temp: 97.9 F (36.6 C) Resp: 16 BP: 122/87 SpO2: 100 %  EBL: None  Complications: No immediate post-treatment complications observed by team, or reported by patient.  Note: The patient tolerated the entire procedure well. A repeat set of vitals were taken after the procedure and the patient was kept under observation following institutional policy, for this type of procedure. Post-procedural neurological assessment was performed, showing return to baseline, prior to discharge. The patient was provided with post-procedure discharge instructions, including a section on how to identify potential problems. Should any problems arise concerning this procedure, the patient was given instructions to immediately contact us, at any time, without hesitation. In any case, we plan to contact the patient by telephone for a follow-up status report regarding this interventional procedure.  Comments:  No additional relevant information.  Plan of Care    Medications ordered for procedure: Meds ordered this encounter  Medications   ropivacaine (PF) 2 mg/mL (0.2%) (NAROPIN) injection 9 mL   tiZANidine (ZANAFLEX) 4 MG tablet    Sig: Take 1 tablet (4 mg total) by mouth at bedtime as needed for muscle spasms.    Dispense:  90 tablet    Refill:  2    Do not place this medication, or any other prescription from our practice, on "Automatic Refill". Patient may have prescription filled one  day early if pharmacy is closed on scheduled refill date.   Medications administered: We administered ropivacaine (PF) 2 mg/mL (0.2%).  See the medical record for exact dosing, route, and time of administration.  Follow-up plan:   Return in about 4 weeks (around 10/02/2021) for Post Procedure Evaluation, virtual.      Recent Visits Date Type Provider Dept  08/06/21 Office Visit Gillis Santa, MD [facility]  07/09/21 Office Visit Gillis Santa, MD [facility]  06/10/21 Procedure visit Gillis Santa, MD [facility]  Showing recent visits within past 90 days and meeting all other requirements Today's Visits Date Type Provider Dept  09/04/21 Procedure visit Gillis Santa, MD [facility]  Showing today's visits and meeting all other requirements Future Appointments No visits were found meeting these conditions. Showing future appointments within next 90 days and meeting all other requirements  Disposition: Discharge home  Discharge (Date   Time): 09/04/2021; 1013 hrs.   Primary Care Physician: Ma Hillock, DO Location: [facility] Note by: Gillis Santa, MD Date: 09/04/2021; Time: 10:15 AM  Disclaimer:  Medicine is not an exact science. The only guarantee in medicine is that nothing is guaranteed. It is important to note that the decision to proceed with this intervention was based on the information collected from the patient. The Data and conclusions were drawn from the patient's questionnaire, the interview, and the physical examination. Because the information was provided in large part by the patient, it cannot be guaranteed that it has not been purposely or unconsciously manipulated. Every effort has been made to obtain as much relevant data as possible for this evaluation. It is important to note that the conclusions that lead to this procedure are derived in large part from the available data. Always take  into  account that the treatment will also be dependent on availability of resources and existing treatment guidelines, considered by other Pain Management Practitioners as being common knowledge and practice, at the time of the intervention. For Medico-Legal purposes, it is also important to point out that variation in procedural techniques and pharmacological choices are the acceptable norm. The indications, contraindications, technique, and results of the above procedure should only be interpreted and judged by a Board-Certified Interventional Pain Specialist with extensive familiarity and expertise in the same exact procedure and technique.

## 2021-09-04 NOTE — Patient Instructions (Signed)

## 2021-09-04 NOTE — Progress Notes (Signed)
Nursing Pain Medication Assessment:  ?Safety precautions to be maintained throughout the outpatient stay will include: orient to surroundings, keep bed in low position, maintain call bell within reach at all times, provide assistance with transfer out of bed and ambulation.  ?Medication Inspection Compliance: Pill count conducted under aseptic conditions, in front of the patient. Neither the pills nor the bottle was removed from the patient's sight at any time. Once count was completed pills were immediately returned to the patient in their original bottle. ? ?Medication #1: Tramadol (Ultram) ?Pill/Patch Count:  120 of 120 pills remain ?Pill/Patch Appearance: Markings consistent with prescribed medication ?Bottle Appearance: Standard pharmacy container. Clearly labeled. ?Filled Date: 06 / 05 / 2022 ?Last Medication intake:   does not take very often  ? ?Medication #2: Tramadol (Ultram) ?Pill/Patch Count:  21 of 30 pills remain ?Pill/Patch Appearance: Markings consistent with prescribed medication ?Bottle Appearance: Standard pharmacy container. Clearly labeled. ?Filled Date: 68 / 05 /  2022 ?Last Medication intake:   does not take very often. Marland Kitchen  ?

## 2021-09-05 ENCOUNTER — Telehealth: Payer: Self-pay | Admitting: *Deleted

## 2021-09-05 NOTE — Telephone Encounter (Signed)
No problems post procedure. 

## 2021-10-01 DIAGNOSIS — Z1231 Encounter for screening mammogram for malignant neoplasm of breast: Secondary | ICD-10-CM | POA: Diagnosis not present

## 2021-10-01 LAB — HM MAMMOGRAPHY

## 2021-10-02 ENCOUNTER — Other Ambulatory Visit: Payer: Self-pay

## 2021-10-02 ENCOUNTER — Ambulatory Visit
Payer: BC Managed Care – PPO | Attending: Student in an Organized Health Care Education/Training Program | Admitting: Student in an Organized Health Care Education/Training Program

## 2021-10-02 ENCOUNTER — Encounter: Payer: Self-pay | Admitting: Student in an Organized Health Care Education/Training Program

## 2021-10-02 DIAGNOSIS — M5030 Other cervical disc degeneration, unspecified cervical region: Secondary | ICD-10-CM

## 2021-10-02 DIAGNOSIS — G4486 Cervicogenic headache: Secondary | ICD-10-CM | POA: Diagnosis not present

## 2021-10-02 DIAGNOSIS — M542 Cervicalgia: Secondary | ICD-10-CM

## 2021-10-02 DIAGNOSIS — G894 Chronic pain syndrome: Secondary | ICD-10-CM | POA: Diagnosis not present

## 2021-10-02 MED ORDER — PREGABALIN 25 MG PO CAPS: ORAL_CAPSULE | ORAL | 2 refills | Status: DC

## 2021-10-02 NOTE — Progress Notes (Signed)
Patient: Sarah Fisher  Service Category: E/M  Provider: Gillis Santa, MD  ?DOB: 12-22-1960  DOS: 10/02/2021  Location: Office  ?MRN: [id number]  Setting: Ambulatory outpatient  Referring Provider: Ma Hillock, DO  ?Type: Established Patient  Specialty: Interventional Pain Management  PCP: Howard Pouch A, DO  ?Location: Remote location  Delivery: TeleHealth    ? ?Virtual Encounter - Pain Management ?PROVIDER NOTE: Information contained herein reflects review and annotations entered in association with encounter. Interpretation of such information and data should be left to medically-trained personnel. Information provided to patient can be located elsewhere in the medical record under "Patient Instructions". Document created using STT-dictation technology, any transcriptional errors that may result from process are unintentional.  ?  ?Contact & Pharmacy ?Preferred: [phone] ?Home: [phone] (home) ?Mobile: [phone] (mobile) ?E-mail: [email or web address]  ?[facility] [place], [place] [address] ?[place] [address] ?Phone: [phone] Fax: [phone] ?  ?Pre-screening  ?Ms. Virtue offered "in-person" vs "virtual" encounter. She indicated preferring virtual for this encounter.  ? ?Reason ?COVID-19*  Social distancing based on CDC and AMA recommendations.  ? ?I contacted Sarah Fisher on 10/02/2021 via telephone.      I clearly identified myself as Gillis Santa, MD. I verified that I was speaking with the correct person using two identifiers (Name: Sarah Fisher, and date of birth: 61-Aug-1962). ? ?Consent ?I sought verbal advanced consent from Sarah Fisher for virtual visit interactions. I informed Sarah Fisher of possible security and privacy concerns, risks, and limitations associated with providing "not-in-person" medical evaluation and management services. I also informed Sarah Fisher of the availability of  "in-person" appointments. Finally, I informed her that there would be a charge for the virtual visit and that she could be  personally, fully or partially, financially responsible for it. Sarah Fisher expressed understanding and agreed to proceed.  ? ?Historic Elements   ?Sarah Fisher is a 61 y.o. year old, adult patient evaluated today after our last contact on 09/04/2021. Sarah Fisher  has a past medical history of Anemia, Arthritis, Cervical spine degeneration (2010), History of carpal tunnel syndrome, Hyperlipidemia, Hypertension, Sacroiliac joint pain (2010), Scoliosis, and Skin cancer of eyelid. She also  has a past surgical history that includes Breast surgery (1986); Hand surgery (Bilateral, 2000 and 2016); cervical facet block (2010); Wisdom tooth extraction (1979); Mass excision (Left, 08/10/2017); I & D extremity (Left, 08/10/2017); and left finger surgery (Left, 08/2017). Sarah Fisher has a current medication list which includes the following prescription(s): boswellia-glucosamine-vit d, cholecalciferol, collagen, estradiol, ferrous gluconate-c-folic acid, ibuprofen, iron-vitamin c, losartan, multi-vitamin gummies, NON FORMULARY, pravastatin, tizanidine, tramadol, triamcinolone ointment, and pregabalin. She  reports that she has never smoked. She has never used smokeless tobacco. She reports current alcohol use. She reports that she does not use drugs. Sarah Fisher has No Known Allergies.  ? ?HPI  ?Today, she is being contacted for a post-procedure assessment. ? ? ?Post-procedure evaluation  ?   ?Procedure:          ?Type: Cervical, Trapezius, posterior deltoid, levator scapula Trigger Point Injection (3+muscle groups)          ?CPT: 20553 ?Myofascial pain syndrome  ? ?1. Cervical DDD (degenerative disc disease) (C5-6)   ?2. Cervicalgia   ?3. Cervicogenic headache (Bilateral) (R>L)   ?4. Chronic pain syndrome   ?5. Cervical facet joint syndrome   ? ?NAS-11 Pain score:  ? Pre-procedure: 5 /  10   ? Post-procedure: 5 /10  ? ?   ?Effectiveness:  ?Initial hour after procedure: 100 %  ?Subsequent 4-6 hours post-procedure: 100 %  ?Analgesia past initial 6 hours: 75 % (left shoulder is better, pain began coming back a couple of days ago but not as severe.)  ?Ongoing improvement:  ?Analgesic:  50% ?Function: Sarah Fisher reports improvement in function ?ROM: Sarah Fisher reports improvement in ROM ? ? ?Laboratory Chemistry Profile  ? ?Renal ?Lab Results  ?Component Value Date  ? BUN 18 09/04/2020  ? CREATININE 0.72 09/04/2020  ? BCR NOT APPLICABLE 83/15/1761  ? GFR 103.25 06/15/2018  ? GFRAA 90 05/04/2020  ? GFRNONAA 90 05/04/2020  ?  Hepatic ?Lab Results  ?Component Value Date  ? AST 21 09/04/2020  ? ALT 17 09/04/2020  ? ALBUMIN 4.3 05/04/2020  ? ALKPHOS 52 05/04/2020  ? HCVAB NEGATIVE 05/15/2016  ?  ?Electrolytes ?Lab Results  ?Component Value Date  ? NA 140 09/04/2020  ? K 4.4 09/04/2020  ? CL 106 09/04/2020  ? CALCIUM 9.1 09/04/2020  ? MG 2.2 10/01/2016  ?  Bone ?Lab Results  ?Component Value Date  ? VD25OH 33.27 03/28/2021  ? 25OHVITD1 40 10/01/2016  ? 25OHVITD2 18 10/01/2016  ? 25OHVITD3 22 10/01/2016  ?  ?Inflammation (CRP: Acute Phase) (ESR: Chronic Phase) ?Lab Results  ?Component Value Date  ? CRP 0.8 10/18/2020  ? ESRSEDRATE 2 10/18/2020  ?    ?  ? ?Note: Above Lab results reviewed. ? ?Assessment  ?The primary encounter diagnosis was Cervical DDD (degenerative disc disease) (C5-6). Diagnoses of Cervicalgia, Cervicogenic headache (Bilateral) (R>L), and Chronic pain syndrome were also pertinent to this visit. ? ?Plan of Care  ? ? ?Sarah Fisher has a current medication list which includes the following long-term medication(s): losartan, pravastatin, and pregabalin. ? ?Increase Lyrica as below, repeat trigger point injections as needed.  Good response from her previous one. ? ?Pharmacotherapy (Medications Ordered): ?Meds ordered this encounter  ?Medications  ? pregabalin (LYRICA) 25 MG capsule  ?  Sig:  50 mg during the day, 75 mg qhs  ?  Dispense:  150 capsule  ?  Refill:  2  ? ?Orders Placed This Encounter  ?Procedures  ? TRIGGER POINT INJECTION  ?  Standing Status:   Standing  ?  Number of Occurrences:   2  ?  Standing Expiration Date:   04/04/2022  ?  Scheduling Instructions:  ?   Cervical, trapezius ,PRN  ?  Order Specific Question:   Where will this procedure be performed?  ?  Answer:   [facility]  ? ? ? ?Follow-up plan:   ?Return if symptoms worsen or fail to improve.   ? ?Recent Visits ?Date Type Provider Dept  ?09/04/21 Procedure visit Gillis Santa, MD [facility]  ?08/06/21 Office Visit Gillis Santa, MD [facility]  ?07/09/21 Office Visit Gillis Santa, MD [facility]  ?Showing recent visits within past 90 days and meeting all other requirements ?Today's Visits ?Date Type Provider Dept  ?10/02/21 Office Visit Gillis Santa, MD [facility]  ?Showing today's visits and meeting all other requirements ?Future Appointments ?No visits were found meeting these conditions. ?Showing future appointments within next 90 days and meeting all other requirements ? ?I discussed the assessment and treatment plan with the patient. The patient was provided an opportunity to ask questions and all were answered. The patient agreed with the plan and demonstrated an understanding of the instructions. ? ?  Patient advised to call back or seek an in-person evaluation if the symptoms or condition worsens. ? ?Duration of encounter: 75mnutes. ? ?Note by: BGillis Santa MD ?Date: 10/02/2021; Time: 2:56 PM ?

## 2021-10-03 ENCOUNTER — Ambulatory Visit (INDEPENDENT_AMBULATORY_CARE_PROVIDER_SITE_OTHER): Payer: BC Managed Care – PPO | Admitting: Family Medicine

## 2021-10-03 ENCOUNTER — Encounter: Payer: Self-pay | Admitting: Family Medicine

## 2021-10-03 VITALS — BP 101/73 | HR 63 | Temp 98.3°F | Ht 62.25 in | Wt 141.0 lb

## 2021-10-03 DIAGNOSIS — E559 Vitamin D deficiency, unspecified: Secondary | ICD-10-CM | POA: Diagnosis not present

## 2021-10-03 DIAGNOSIS — G5603 Carpal tunnel syndrome, bilateral upper limbs: Secondary | ICD-10-CM

## 2021-10-03 DIAGNOSIS — D126 Benign neoplasm of colon, unspecified: Secondary | ICD-10-CM

## 2021-10-03 DIAGNOSIS — I10 Essential (primary) hypertension: Secondary | ICD-10-CM | POA: Diagnosis not present

## 2021-10-03 DIAGNOSIS — R790 Abnormal level of blood mineral: Secondary | ICD-10-CM | POA: Diagnosis not present

## 2021-10-03 DIAGNOSIS — Z1231 Encounter for screening mammogram for malignant neoplasm of breast: Secondary | ICD-10-CM | POA: Diagnosis not present

## 2021-10-03 DIAGNOSIS — Z0000 Encounter for general adult medical examination without abnormal findings: Secondary | ICD-10-CM

## 2021-10-03 DIAGNOSIS — E782 Mixed hyperlipidemia: Secondary | ICD-10-CM

## 2021-10-03 LAB — COMPREHENSIVE METABOLIC PANEL
ALT: 18 U/L (ref 0–35)
AST: 20 U/L (ref 0–37)
Albumin: 4.4 g/dL (ref 3.5–5.2)
Alkaline Phosphatase: 58 U/L (ref 39–117)
BUN: 16 mg/dL (ref 6–23)
CO2: 28 mEq/L (ref 19–32)
Calcium: 9.5 mg/dL (ref 8.4–10.5)
Chloride: 103 mEq/L (ref 96–112)
Creatinine, Ser: 0.77 mg/dL (ref 0.40–1.20)
GFR: 83.47 mL/min (ref >60.00)
Glucose, Bld: 84 mg/dL (ref 70–99)
Potassium: 4.2 mEq/L (ref 3.5–5.1)
Sodium: 140 mEq/L (ref 135–145)
Total Bilirubin: 0.5 mg/dL (ref 0.2–1.2)
Total Protein: 6.6 g/dL (ref 6.0–8.3)

## 2021-10-03 LAB — LIPID PANEL
Cholesterol: 220 mg/dL — ABNORMAL HIGH (ref 0–200)
HDL: 73.3 mg/dL (ref >39.00)
LDL Cholesterol: 112 mg/dL — ABNORMAL HIGH (ref 0–99)
NonHDL: 146.29
Total CHOL/HDL Ratio: 3
Triglycerides: 170 mg/dL — ABNORMAL HIGH (ref 0.0–149.0)
VLDL: 34 mg/dL (ref 0.0–40.0)

## 2021-10-03 LAB — HEMOGLOBIN A1C: Hgb A1c MFr Bld: 5.7 % (ref 4.6–6.5)

## 2021-10-03 LAB — CBC
HCT: 34.9 % — ABNORMAL LOW (ref 36.0–46.0)
Hemoglobin: 11.9 g/dL — ABNORMAL LOW (ref 12.0–15.0)
MCHC: 34.2 g/dL (ref 30.0–36.0)
MCV: 88.7 fl (ref 78.0–100.0)
Platelets: 268 10*3/uL (ref 150.0–400.0)
RBC: 3.93 Mil/uL (ref 3.87–5.11)
RDW: 12.3 % (ref 11.5–15.5)
WBC: 4.7 10*3/uL (ref 4.0–10.5)

## 2021-10-03 LAB — VITAMIN D 25 HYDROXY (VIT D DEFICIENCY, FRACTURES): VITD: 35.36 ng/mL (ref 30.00–100.00)

## 2021-10-03 LAB — TSH: TSH: 2.15 u[IU]/mL (ref 0.35–5.50)

## 2021-10-03 MED ORDER — PRAVASTATIN SODIUM 10 MG PO TABS
10.0000 mg | ORAL_TABLET | Freq: Every day | ORAL | 3 refills | Status: DC
Start: 2021-10-03 — End: 2022-09-02

## 2021-10-03 MED ORDER — LOSARTAN POTASSIUM 50 MG PO TABS: 50.0000 mg | ORAL_TABLET | Freq: Every day | ORAL | 1 refills | Status: DC

## 2021-10-03 NOTE — Patient Instructions (Addendum)
? ?Call in June if you have not heard from your GI team to schedule.  ?[facility] ?Address:  [address], [address], [place] [address] ?Phone: [phone] ? ? ?Health Maintenance, Female ?Adopting a healthy lifestyle and getting preventive care are important in promoting health and wellness. Ask your health care provider about: ?The right schedule for you to have regular tests and exams. ?Things you can do on your own to prevent diseases and keep yourself healthy. ?What should I know about diet, weight, and exercise? ?Eat a healthy diet ? ?Eat a diet that includes plenty of vegetables, fruits, low-fat dairy products, and lean protein. ?Do not eat a lot of foods that are high in solid fats, added sugars, or sodium. ?Maintain a healthy weight ?Body mass index (BMI) is used to identify weight problems. It estimates body fat based on height and weight. Your health care provider can help determine your BMI and help you achieve or maintain a healthy weight. ?Get regular exercise ?Get regular exercise. This is one of the most important things you can do for your health. Most adults should: ?Exercise for at least 150 minutes each week. The exercise should increase your heart rate and make you sweat (moderate-intensity exercise). ?Do strengthening exercises at least twice a week. This is in addition to the moderate-intensity exercise. ?Spend less time sitting. Even light physical activity can be beneficial. ?Watch cholesterol and blood lipids ?Have your blood tested for lipids and cholesterol at 61 years of age, then have this test every 5 years. ?Have your cholesterol levels checked more often if: ?Your lipid or cholesterol levels are high. ?You are older than 61 years of age. ?You are at high risk for heart disease. ?What should I know about cancer screening? ?Depending on your health history and family history, you may need to have cancer screening at various ages. This may include screening  for: ?Breast cancer. ?Cervical cancer. ?Colorectal cancer. ?Skin cancer. ?Lung cancer. ?What should I know about heart disease, diabetes, and high blood pressure? ?Blood pressure and heart disease ?High blood pressure causes heart disease and increases the risk of stroke. This is more likely to develop in people who have high blood pressure readings or are overweight. ?Have your blood pressure checked: ?Every 3-5 years if you are 38-57 years of age. ?Every year if you are 64 years old or older. ?Diabetes ?Have regular diabetes screenings. This checks your fasting blood sugar level. Have the screening done: ?Once every three years after age 36 if you are at a normal weight and have a low risk for diabetes. ?More often and at a younger age if you are overweight or have a high risk for diabetes. ?What should I know about preventing infection? ?Hepatitis B ?If you have a higher risk for hepatitis B, you should be screened for this virus. Talk with your health care provider to find out if you are at risk for hepatitis B infection. ?Hepatitis C ?Testing is recommended for: ?Everyone born from [birth year] through 1965. ?Anyone with known risk factors for hepatitis C. ?Sexually transmitted infections (STIs) ?Get screened for STIs, including gonorrhea and chlamydia, if: ?You are sexually active and are younger than 61 years of age. ?You are older than 61 years of age and your health care provider tells you that you are at risk for this type of infection. ?Your sexual activity has changed since you were last screened, and you are at increased risk for chlamydia or gonorrhea. Ask your health care provider  if you are at risk. ?Ask your health care provider about whether you are at high risk for HIV. Your health care provider may recommend a prescription medicine to help prevent HIV infection. If you choose to take medicine to prevent HIV, you should first get tested for HIV. You should then be tested every 3 months for as long as you  are taking the medicine. ?Pregnancy ?If you are about to stop having your period (premenopausal) and you may become pregnant, seek counseling before you get pregnant. ?Take 400 to 800 micrograms (mcg) of folic acid every day if you become pregnant. ?Ask for birth control (contraception) if you want to prevent pregnancy. ?Osteoporosis and menopause ?Osteoporosis is a disease in which the bones lose minerals and strength with aging. This can result in bone fractures. If you are 39 years old or older, or if you are at risk for osteoporosis and fractures, ask your health care provider if you should: ?Be screened for bone loss. ?Take a calcium or vitamin D supplement to lower your risk of fractures. ?Be given hormone replacement therapy (HRT) to treat symptoms of menopause. ?Follow these instructions at home: ?Alcohol use ?Do not drink alcohol if: ?Your health care provider tells you not to drink. ?You are pregnant, may be pregnant, or are planning to become pregnant. ?If you drink alcohol: ?Limit how much you have to: ?0-1 drink a day. ?Know how much alcohol is in your drink. In the [place] one drink equals one 12 oz bottle of beer (355 mL), one 5 oz glass of wine (148 mL), or one 1? oz glass of hard liquor (44 mL). ?Lifestyle ?Do not use any products that contain nicotine or tobacco. These products include cigarettes, chewing tobacco, and vaping devices, such as e-cigarettes. If you need help quitting, ask your health care provider. ?Do not use street drugs. ?Do not share needles. ?Ask your health care provider for help if you need support or information about quitting drugs. ?General instructions ?Schedule regular health, dental, and eye exams. ?Stay current with your vaccines. ?Tell your health care provider if: ?You often feel depressed. ?You have ever been abused or do not feel safe at home. ?Summary ?Adopting a healthy lifestyle and getting preventive care are important in promoting health and wellness. ?Follow your  health care provider's instructions about healthy diet, exercising, and getting tested or screened for diseases. ?Follow your health care provider's instructions on monitoring your cholesterol and blood pressure. ?This information is not intended to replace advice given to you by your health care provider. Make sure you discuss any questions you have with your health care provider. ?Document Revised: 11/12/2020 Document Reviewed: 11/12/2020 ?Elsevier Patient Education ? [employer]. ? ?

## 2021-10-03 NOTE — Progress Notes (Signed)
? ?This visit occurred during the SARS-CoV-2 public health emergency.  Safety protocols were in place, including screening questions prior to the visit, additional usage of staff PPE, and extensive cleaning of exam room while observing appropriate contact time as indicated for disinfecting solutions.  ? ? ?Patient ID: Sarah Fisher, female  DOB: 1960/12/09, 61 y.o.   MRN: [id number] ?Patient Care Team  ?  Relationship Specialty Notifications Start End  ?Ma Hillock, DO PCP - General Family Medicine  04/14/16   ?Mauri Pole, MD Consulting Physician Gastroenterology  06/10/17   ?Milinda Pointer, MD Referring Physician Pain Medicine  06/10/17   ?Kassie Mends, MD Referring Physician Orthopedic Surgery  09/04/20   ?Roque Cash., MD  Obstetrics and Gynecology  10/03/21   ? ? ?Chief Complaint  ?Patient presents with  ? Annual Exam  ?  Pt is fasting  ? ? ?Subjective: ?Sarah Fisher is a 61 y.o.  Female  present for CPE/cmc ?All past medical history, surgical history, allergies, family history, immunizations, medications and social history were updated in the electronic medical record today. ?All recent labs, ED visits and hospitalizations within the last year were reviewed. ? ?Health maintenance:  ?Colonoscopy: fhx present in [facility]. 08/01/2016 Dr. Silverio Decamp, 5 yr follow up.  Placed referral back to her GI team today just for insurance purposes.  If she does not hear from them by June she is aware to call them to get scheduled. ?Mammogram: [facility] [facility] [place] ?Cervical cancer screening: last pap: 2019, results: normal per pt. [place] 2016 with submucous and intramural myomas 12.2 and 9.2 mm. 5 yr ?Immunizations: tdap UTD 12/2014, Influenza UTD 2022 (encouraged yearly), covid x3, shingrix completed ?Infectious disease screening: HIV and HEP c completed ?DEXA: 11/2020 (normal -0.8) ?Patient has a Dental home. ?Hospitalizations/ED visits:  reviewed ? ?Hypertension/hyperlipidemia/overweight: Patient reports compliance with losartan 50 mg daily and pravastatin.  Patient denies chest pain, shortness of breath, dizziness or lower extremity edema.  ?Pt is  prescribed statin. ?Diet: low sodium- eating healthy- watching her weight ?Exercise:walking about a mile a day. ?RF: HLD, HTN, overweight ? ?Carpal tunnel: Patient has had carpal tunnel for many years.  She wears night splints every evening.  She reports she had been evaluated at one time for her carpal tunnel and elected not to move forward with surgical intervention at that time.  She is however endorsing more increased discomfort right greater than left today and is desiring a referral back to her hand surgeon Dr. Fredna Dow. ? ?  10/03/2021  ? 10:06 AM 09/04/2020  ?  2:59 PM 06/15/2018  ?  9:38 AM 04/05/2018  ?  1:05 PM 03/16/2018  ?  2:03 PM  ?Depression screen PHQ 2/9  ?Decreased Interest 0 0 0 0 0  ?Down, Depressed, Hopeless 0 0 0 0 0  ?PHQ - 2 Score 0 0 0 0 0  ? ?   ? View : No data to display.  ?  ?  ?  ? ? ? ?Immunization History  ?Administered Date(s) Administered  ? Influenza Inj Mdck Quad Pf 04/15/2018  ? Influenza,inj,Quad PF,6+ Mos 04/14/2016, 06/03/2017, 04/13/2019, 03/28/2021  ? Influenza-Unspecified 06/07/2020  ? MMR 05/18/2018  ? PFIZER(Purple Top)SARS-COV-2 Vaccination 09/10/2019, 10/11/2019, 06/07/2020  ? Tdap 12/06/2014  ? Zoster Recombinat (Shingrix) 09/04/2020, 11/12/2020  ? ? ?Past Medical History:  ?Diagnosis Date  ? Anemia   ? Arthritis   ? Cervical spine degeneration 2010  ? C5-6  ? History of  carpal tunnel syndrome   ? Hyperlipidemia   ? Hypertension   ? Immune to varicella 11/12/2020  ? Sacroiliac joint pain 2010  ? right  ? Scoliosis   ? Skin cancer of eyelid   ? ?No Known Allergies ?Past Surgical History:  ?Procedure Laterality Date  ? BREAST SURGERY  1986  ? reduction  ? cervical facet block  2010  ? HAND SURGERY Bilateral 2000 and 2016  ? benign tumor removal   ? I & D EXTREMITY  Left 08/10/2017  ? Procedure: DEBRIDEMENT DISTAL INTERPHALANGEAL JOINT;  Surgeon: Leanora Cover, MD;  Location: [facility];  Service: Orthopedics;  Laterality: Left;  ? left finger surgery Left 08/2017  ? MASS EXCISION Left 08/10/2017  ? Procedure: LEFT LONG FINGER EXCISION MASS;  Surgeon: Leanora Cover, MD;  Location: [facility];  Service: Orthopedics;  Laterality: Left;  ? [place]  ? ?Family History  ?Problem Relation Age of Onset  ? Hypertension Mother   ? Arthritis Mother   ? Hypertension Father   ? Liver cancer Father   ? Hypertension Sister   ? Arthritis Maternal Aunt   ? Hypertension Maternal Grandmother   ? Kidney cancer Maternal Grandmother   ? Alzheimer's disease Maternal Grandfather   ? Colon cancer Maternal Grandfather 104  ? Diabetes Maternal Grandfather   ? Alzheimer's disease Paternal Grandmother   ? Hypertension Brother   ? Diabetes Brother   ? Esophageal cancer Neg Hx   ? Rectal cancer Neg Hx   ? Stomach cancer Neg Hx   ? ?Social History  ? ?Social History Narrative  ? Divorced. 3 children, one lives with [name]).  ? She is from [place], moved in 2016.   ? Housewife. B.A. Degree.   ? Drinks caffeine, uses herbal remedies. Takes a daily vitamin.   ? Wears her seatbelt, bicycle helmet.Smoke detector in the home.   ? Exercises routinely.   ? Feels safe in her relationships.   ? ? ?Allergies as of 10/03/2021   ?No Known Allergies ?  ? ?  ?Medication List  ?  ? ?  ? Accurate as of October 03, 2021  4:06 PM. If you have any questions, ask your nurse or doctor.  ?  ?  ? ?  ? ?STOP taking these medications   ? ?IRON-VITAMIN C PO ?Stopped by: Howard Pouch, DO ?  ?NON FORMULARY ?Stopped by: Howard Pouch, DO ?  ? ?  ? ?TAKE these medications   ? ?COLLAGEN PO ?Take by mouth. ?  ?estradiol 0.1 MG/GM vaginal cream ?Commonly known as: ESTRACE ?Place 0.5 g vaginally 2 (two) times a week. ?  ?ibuprofen 200 MG tablet ?Commonly known as: ADVIL ?Take 400 mg by mouth  every 4 (four) hours as needed. ?  ?IRON-C PO ?Take by mouth. ?  ?losartan 50 MG tablet ?Commonly known as: COZAAR ?Take 1 tablet (50 mg total) by mouth daily. ?  ?Multi-Vitamin Gummies Chew ?Chew 1 Units by mouth daily. ?  ?OSTEO BI-FLEX ONE PER DAY PO ?Take by mouth. ?  ?pravastatin 10 MG tablet ?Commonly known as: PRAVACHOL ?Take 1 tablet (10 mg total) by mouth daily. ?  ?pregabalin 25 MG capsule ?Commonly known as: Lyrica ?50 mg during the day, 75 mg qhs ?  ?tiZANidine 4 MG tablet ?Commonly known as: Zanaflex ?Take 1 tablet (4 mg total) by mouth at bedtime as needed for muscle spasms. ?  ?traMADol 50 MG tablet ?Commonly known as:  ULTRAM ?Take 50-100 mg by mouth every 12 (twelve) hours as needed. ?  ?triamcinolone ointment 0.5 % ?Commonly known as: KENALOG ?Apply 1 application topically 2 (two) times daily. ?  ?VITAMIN D3 PO ?Take by mouth. ?  ? ?  ? ? ?All past medical history, surgical history, allergies, family history, immunizations andmedications were updated in the EMR today and reviewed under the history and medication portions of their EMR.    ? ?No results found for this or any previous visit (from the past 2160 hour(s)). ? ?ROS ?14 pt review of systems performed and negative (unless mentioned in an HPI) ? ?Objective: ?BP 101/73   Pulse 63   Temp 98.3 ?F (36.8 ?C) (Oral)   Ht 5' 2.25" (1.581 m)   Wt 141 lb (64 kg)   LMP  (LMP Unknown)   SpO2 99%   BMI 25.58 kg/m?  ?Physical Exam ?Constitutional:   ?   General: She is not in acute distress. ?   Appearance: Normal appearance. She is not ill-appearing, toxic-appearing or diaphoretic.  ?HENT:  ?   Head: Normocephalic and atraumatic.  ?   Right Ear: Tympanic membrane, ear canal and external ear normal. There is no impacted cerumen.  ?   Left Ear: Tympanic membrane, ear canal and external ear normal. There is no impacted cerumen.  ?   Nose: Nose normal. No congestion or rhinorrhea.  ?   Mouth/Throat:  ?   Mouth: Mucous membranes are moist.  ?   Pharynx:  Oropharynx is clear. No oropharyngeal exudate or posterior oropharyngeal erythema.  ?Eyes:  ?   General: No scleral icterus.    ?   Right eye: No discharge.     ?   Left eye: No discharge.  ?   Extraocular Movements: Extraocular movements intact.  ?   Pupils: Pup

## 2021-10-04 ENCOUNTER — Telehealth: Payer: Self-pay

## 2021-10-04 LAB — IRON,TIBC AND FERRITIN PANEL
%SAT: 32 % (calc) (ref 16–45)
Ferritin: 46 ng/mL (ref 16–288)
Iron: 127 ug/dL (ref 45–160)
TIBC: 392 mcg/dL (calc) (ref 250–450)

## 2021-10-04 NOTE — Telephone Encounter (Signed)
Received Mammogram results from [facility] on 10/04/21. Will place on PCP desk for review. ? ?

## 2021-10-07 ENCOUNTER — Telehealth: Payer: Self-pay | Admitting: Student in an Organized Health Care Education/Training Program

## 2021-10-08 DIAGNOSIS — D485 Neoplasm of uncertain behavior of skin: Secondary | ICD-10-CM | POA: Diagnosis not present

## 2021-10-08 DIAGNOSIS — C441191 Basal cell carcinoma of skin of left upper eyelid, including canthus: Secondary | ICD-10-CM | POA: Diagnosis not present

## 2021-10-09 ENCOUNTER — Telehealth: Payer: Self-pay | Admitting: *Deleted

## 2021-10-09 ENCOUNTER — Telehealth: Payer: Self-pay | Admitting: Student in an Organized Health Care Education/Training Program

## 2021-10-09 DIAGNOSIS — G894 Chronic pain syndrome: Secondary | ICD-10-CM

## 2021-10-09 DIAGNOSIS — M542 Cervicalgia: Secondary | ICD-10-CM

## 2021-10-09 DIAGNOSIS — M5030 Other cervical disc degeneration, unspecified cervical region: Secondary | ICD-10-CM

## 2021-10-09 MED ORDER — PREGABALIN 50 MG PO CAPS: ORAL_CAPSULE | ORAL | 2 refills | Status: DC

## 2021-10-09 NOTE — Telephone Encounter (Signed)
Appeal questions answered and appeal has been initiated for qty ammendment.  [id number].  Number used [phone] ?

## 2021-10-09 NOTE — Telephone Encounter (Signed)
Called and let patient know that [facility] has changed the Rx to accommodate insurance restrictions on dosing.  She will call her pharmacy to see if she is able to pick this up or if it needs a PA as well.  She will let me know.   ?

## 2021-10-10 ENCOUNTER — Encounter: Payer: Self-pay | Admitting: Gastroenterology

## 2021-10-15 ENCOUNTER — Encounter: Payer: Self-pay | Admitting: Gastroenterology

## 2021-11-11 DIAGNOSIS — L900 Lichen sclerosus et atrophicus: Secondary | ICD-10-CM | POA: Diagnosis not present

## 2021-11-11 DIAGNOSIS — Z1283 Encounter for screening for malignant neoplasm of skin: Secondary | ICD-10-CM | POA: Diagnosis not present

## 2021-11-11 DIAGNOSIS — D225 Melanocytic nevi of trunk: Secondary | ICD-10-CM | POA: Diagnosis not present

## 2021-12-05 DIAGNOSIS — D485 Neoplasm of uncertain behavior of skin: Secondary | ICD-10-CM | POA: Diagnosis not present

## 2021-12-05 DIAGNOSIS — C44101 Unspecified malignant neoplasm of skin of unspecified eyelid, including canthus: Secondary | ICD-10-CM

## 2021-12-05 DIAGNOSIS — Z01818 Encounter for other preprocedural examination: Secondary | ICD-10-CM | POA: Diagnosis not present

## 2021-12-05 HISTORY — PX: MOHS SURGERY: SUR867

## 2021-12-05 HISTORY — DX: Unspecified malignant neoplasm of skin of unspecified eyelid, including canthus: C44.101

## 2021-12-16 ENCOUNTER — Encounter: Payer: BC Managed Care – PPO | Admitting: Gastroenterology

## 2021-12-19 DIAGNOSIS — C441191 Basal cell carcinoma of skin of left upper eyelid, including canthus: Secondary | ICD-10-CM | POA: Diagnosis not present

## 2022-01-04 ENCOUNTER — Other Ambulatory Visit: Payer: Self-pay | Admitting: Student in an Organized Health Care Education/Training Program

## 2022-01-04 DIAGNOSIS — G894 Chronic pain syndrome: Secondary | ICD-10-CM

## 2022-01-04 DIAGNOSIS — M5030 Other cervical disc degeneration, unspecified cervical region: Secondary | ICD-10-CM

## 2022-01-04 DIAGNOSIS — M542 Cervicalgia: Secondary | ICD-10-CM

## 2022-01-06 ENCOUNTER — Telehealth: Payer: Self-pay | Admitting: Student in an Organized Health Care Education/Training Program

## 2022-01-06 ENCOUNTER — Other Ambulatory Visit: Payer: Self-pay | Admitting: *Deleted

## 2022-01-06 DIAGNOSIS — M5030 Other cervical disc degeneration, unspecified cervical region: Secondary | ICD-10-CM

## 2022-01-06 DIAGNOSIS — M542 Cervicalgia: Secondary | ICD-10-CM

## 2022-01-06 DIAGNOSIS — G894 Chronic pain syndrome: Secondary | ICD-10-CM

## 2022-01-06 NOTE — Telephone Encounter (Signed)
Patient called in stated that the pharmacy needs pre autho for her meds to be filled. Please give patient a call. Patient stated she is almost of meds. Thanks

## 2022-01-06 NOTE — Telephone Encounter (Signed)
PA handled by LP

## 2022-01-09 ENCOUNTER — Telehealth: Payer: Self-pay | Admitting: Student in an Organized Health Care Education/Training Program

## 2022-01-09 MED ORDER — PREGABALIN 50 MG PO CAPS: ORAL_CAPSULE | ORAL | 2 refills | Status: DC

## 2022-01-09 NOTE — Telephone Encounter (Signed)
Patient called in to get meds refilled, patient is out. Please give patient a call. Thanks

## 2022-01-09 NOTE — Telephone Encounter (Signed)
Lyrica Rx has been sent. Patient notified.

## 2022-01-16 ENCOUNTER — Encounter: Payer: Self-pay | Admitting: Gastroenterology

## 2022-02-17 ENCOUNTER — Ambulatory Visit (AMBULATORY_SURGERY_CENTER): Payer: Self-pay | Admitting: *Deleted

## 2022-02-17 VITALS — Ht 60.0 in | Wt 147.8 lb

## 2022-02-17 DIAGNOSIS — Z86010 Personal history of colonic polyps: Secondary | ICD-10-CM

## 2022-02-17 MED ORDER — NA SULFATE-K SULFATE-MG SULF 17.5-3.13-1.6 GM/177ML PO SOLN: 1.0000 | Freq: Once | ORAL | 0 refills | Status: AC

## 2022-02-17 NOTE — Progress Notes (Signed)
No egg or soy allergy known to patient  No issues known to pt with past sedation with any surgeries or procedures Patient denies ever being told they had issues or difficulty with intubation  No FH of Malignant Hyperthermia Pt is not on diet pills Pt is not on  home 02  Pt is not on blood thinners  Pt denies issues with constipation  No A fib or A flutter Have any cardiac testing pending--no Pt instructed to use Singlecare.[email or web address] or GoodRx for a price reduction on prep   

## 2022-03-06 ENCOUNTER — Encounter: Payer: Self-pay | Admitting: Gastroenterology

## 2022-03-20 ENCOUNTER — Encounter: Payer: Self-pay | Admitting: Family Medicine

## 2022-03-20 ENCOUNTER — Ambulatory Visit: Payer: BC Managed Care – PPO | Admitting: Family Medicine

## 2022-03-20 VITALS — BP 107/72 | HR 80 | Temp 98.1°F | Ht 60.0 in | Wt 147.0 lb

## 2022-03-20 DIAGNOSIS — G894 Chronic pain syndrome: Secondary | ICD-10-CM

## 2022-03-20 DIAGNOSIS — M4802 Spinal stenosis, cervical region: Secondary | ICD-10-CM

## 2022-03-20 DIAGNOSIS — E663 Overweight: Secondary | ICD-10-CM | POA: Diagnosis not present

## 2022-03-20 DIAGNOSIS — E782 Mixed hyperlipidemia: Secondary | ICD-10-CM | POA: Diagnosis not present

## 2022-03-20 DIAGNOSIS — M5412 Radiculopathy, cervical region: Secondary | ICD-10-CM

## 2022-03-20 DIAGNOSIS — E559 Vitamin D deficiency, unspecified: Secondary | ICD-10-CM

## 2022-03-20 DIAGNOSIS — Z23 Encounter for immunization: Secondary | ICD-10-CM

## 2022-03-20 DIAGNOSIS — R200 Anesthesia of skin: Secondary | ICD-10-CM

## 2022-03-20 DIAGNOSIS — M792 Neuralgia and neuritis, unspecified: Secondary | ICD-10-CM

## 2022-03-20 DIAGNOSIS — I10 Essential (primary) hypertension: Secondary | ICD-10-CM | POA: Diagnosis not present

## 2022-03-20 DIAGNOSIS — M542 Cervicalgia: Secondary | ICD-10-CM

## 2022-03-20 DIAGNOSIS — M5030 Other cervical disc degeneration, unspecified cervical region: Secondary | ICD-10-CM

## 2022-03-20 MED ORDER — LOSARTAN POTASSIUM 50 MG PO TABS: 50.0000 mg | ORAL_TABLET | Freq: Every day | ORAL | 1 refills | Status: DC

## 2022-03-20 MED ORDER — PREGABALIN 50 MG PO CAPS: ORAL_CAPSULE | ORAL | 5 refills | Status: DC

## 2022-03-20 NOTE — Patient Instructions (Addendum)
Return in about 24 weeks (around 09/04/2022) for Routine chronic condition follow-up.        Great to see you today.  I have refilled the medication(s) we provide.   If labs were collected, we will inform you of lab results once received either by echart message or telephone call.   - echart message- for normal results that have been seen by the patient already.   - telephone call: abnormal results or if patient has not viewed results in their echart.

## 2022-03-20 NOTE — Progress Notes (Signed)
Patient ID: Sarah Fisher, female  DOB: 1960-08-12, 61 y.o.   MRN: [id number] Patient Care Team    Relationship Specialty Notifications Start End  Ma Hillock, DO PCP - General Family Medicine  04/14/16   Mauri Pole, MD Consulting Physician Gastroenterology  06/10/17   Milinda Pointer, MD Referring Physician Pain Medicine  06/10/17   Kassie Mends, MD Referring Physician Orthopedic Surgery  09/04/20   Roque Cash., MD  Obstetrics and Gynecology  10/03/21     Chief Complaint  Patient presents with   Hypertension    Cmc; pt is not fasting    Subjective: Sarah Fisher is a 61 y.o.  Female  present for cmc All past medical history, surgical history, allergies, family history, immunizations, medications and social history were updated in the electronic medical record today. All recent labs, ED visits and hospitalizations within the last year were reviewed.  Hypertension/hyperlipidemia/overweight: Patient reports compliance with losartan 50 mg daily and pravastatin.  Patient denies chest pain, shortness of breath, dizziness or lower extremity edema.  Pt is  prescribed statin. Diet: low sodium- eating healthy- watching her weight Exercise:walking about a mile a day. RF: HLD, HTN, overweight   Chronic cervical radiculopathy (Bilateral) (L>R)/Numbness of upper extremity (Bilateral) (L>R)/Neurogenic pain/Cervical central spinal stenosis C5-6 (Right)/Cervical DDD (degenerative disc disease) (C5-6)/Cervicalgia/Chronic pain syndrome Patient has been prescribed Lyrica 50 mg in the day 100 mg nightly for her chronic pain/cervical radiculopathy and it is working well for her.  This is typically managed by her chronic pain clinic, but she would like to have [place] take over this particular prescription only.      10/03/2021   10:06 AM 09/04/2020    2:59 PM 06/15/2018    9:38 AM 04/05/2018    1:05 PM 03/16/2018    2:03 PM  Depression screen PHQ 2/9  Decreased Interest 0 0 0  0 0  Down, Depressed, Hopeless 0 0 0 0 0  PHQ - 2 Score 0 0 0 0 0       No data to display           Immunization History  Administered Date(s) Administered   Influenza Inj Mdck Quad Pf 04/15/2018   Influenza,inj,Quad PF,6+ Mos 04/14/2016, 06/03/2017, 04/13/2019, 03/28/2021   Influenza-Unspecified 06/07/2020   MMR 05/18/2018   PFIZER(Purple Top)SARS-COV-2 Vaccination 09/10/2019, 10/11/2019, 06/07/2020   Tdap 12/06/2014   Zoster Recombinat (Shingrix) 09/04/2020, 11/12/2020    Past Medical History:  Diagnosis Date   Anemia    Arthritis    Cervical spine degeneration 2010   C5-6   History of carpal tunnel syndrome    Hyperlipidemia    Hypertension    Immune to varicella 11/12/2020   Sacroiliac joint pain 2010   right   Scoliosis    Skin cancer of eyelid 12/2021   Mohs   No Known Allergies Past Surgical History:  Procedure Laterality Date   BREAST SURGERY  1986   reduction   cervical facet block  2010   HAND SURGERY Bilateral 2000 and 2016   benign tumor removal    I & D EXTREMITY Left 08/10/2017   Procedure: DEBRIDEMENT DISTAL INTERPHALANGEAL JOINT;  Surgeon: Leanora Cover, MD;  Location: [facility];  Service: Orthopedics;  Laterality: Left;   left finger surgery Left 08/2017   MASS EXCISION Left 08/10/2017   Procedure: LEFT LONG FINGER EXCISION MASS;  Surgeon: Leanora Cover, MD;  Location: [facility];  Service: Orthopedics;  Laterality: Left;   MOHS SURGERY Left 12/2021   eyelid and reconstruction   WISDOM TOOTH EXTRACTION  1979   Family History  Problem Relation Age of Onset   Hypertension Mother    Arthritis Mother    Hypertension Father    Liver cancer Father    Hypertension Sister    Arthritis Maternal Aunt    Hypertension Maternal Grandmother    Kidney cancer Maternal Grandmother    Alzheimer's disease Maternal Grandfather    Colon cancer Maternal Grandfather 23   Diabetes Maternal Grandfather    Alzheimer's disease  Paternal Grandmother    Hypertension Brother    Diabetes Brother    Esophageal cancer Neg Hx    Rectal cancer Neg Hx    Stomach cancer Neg Hx    Social History   Social History Narrative   Divorced. 3 children, one lives with her [name]).   She is from [place], moved in 2016.    Housewife. B.A. Degree.    Drinks caffeine, uses herbal remedies. Takes a daily vitamin.    Wears her seatbelt, bicycle helmet.Smoke detector in the home.    Exercises routinely.    Feels safe in her relationships.     Allergies as of 03/20/2022   No Known Allergies      Medication List        Accurate as of March 20, 2022 10:02 AM. If you have any questions, ask your nurse or doctor.          clobetasol cream 0.05 % Commonly known as: TEMOVATE Apply 1 Application topically 2 (two) times daily.   COLLAGEN PO Take by mouth.   diclofenac 75 MG EC tablet Commonly known as: VOLTAREN Take 75 mg by mouth 2 (two) times daily.   erythromycin ophthalmic ointment 3 (three) times daily.   estradiol 0.1 MG/GM vaginal cream Commonly known as: ESTRACE Place 0.5 g vaginally 2 (two) times a week.   ibuprofen 200 MG tablet Commonly known as: ADVIL Take 400 mg by mouth every 4 (four) hours as needed.   IRON-C PO Take by mouth.   losartan 50 MG tablet Commonly known as: COZAAR Take 1 tablet (50 mg total) by mouth daily.   Multi-Vitamin Gummies Chew Chew 1 Units by mouth daily.   OSTEO BI-FLEX ONE PER DAY PO Take by mouth.   pravastatin 10 MG tablet Commonly known as: PRAVACHOL Take 1 tablet (10 mg total) by mouth daily.   pregabalin 50 MG capsule Commonly known as: Lyrica 50 mg during day, 100 mg qhs   tiZANidine 4 MG tablet Commonly known as: Zanaflex Take 1 tablet (4 mg total) by mouth at bedtime as needed for muscle spasms.   traMADol 50 MG tablet Commonly known as: ULTRAM Take 50-100 mg by mouth every 12 (twelve) hours as needed.   tretinoin 0.025 % cream Commonly  known as: RETIN-A Apply topically at bedtime.   triamcinolone ointment 0.5 % Commonly known as: KENALOG Apply 1 application topically 2 (two) times daily.   VITAMIN D3 PO Take by mouth.        All past medical history, surgical history, allergies, family history, immunizations andmedications were updated in the EMR today and reviewed under the history and medication portions of their EMR.     No results found for this or any previous visit (from the past 2160 hour(s)).  ROS 14 pt review of systems performed and negative (unless mentioned in an HPI)  Objective: BP 107/72   Pulse 80  Temp 98.1 F (36.7 C) (Oral)   Ht 5' (1.524 m)   Wt 147 lb (66.7 kg)   LMP  (LMP Unknown)   SpO2 98%   BMI 28.71 kg/m  Physical Exam Vitals and nursing note reviewed.  Constitutional:      General: She is not in acute distress.    Appearance: Normal appearance. She is not ill-appearing, toxic-appearing or diaphoretic.  HENT:     Head: Normocephalic and atraumatic.  Eyes:     General: No scleral icterus.       Right eye: No discharge.        Left eye: No discharge.     Extraocular Movements: Extraocular movements intact.     Conjunctiva/sclera: Conjunctivae normal.     Pupils: Pupils are equal, round, and reactive to light.  Cardiovascular:     Rate and Rhythm: Normal rate and regular rhythm.  Pulmonary:     Effort: Pulmonary effort is normal. No respiratory distress.     Breath sounds: Normal breath sounds. No wheezing, rhonchi or rales.  Musculoskeletal:     Cervical back: Neck supple. No tenderness.     Right lower leg: No edema.     Left lower leg: No edema.  Lymphadenopathy:     Cervical: No cervical adenopathy.  Skin:    General: Skin is warm and dry.     Coloration: Skin is not jaundiced or pale.     Findings: No erythema or rash.  Neurological:     Mental Status: She is alert and oriented to person, place, and time. Mental status is at baseline.     Motor: No weakness.      Gait: Gait normal.  Psychiatric:        Mood and Affect: Mood normal.        Behavior: Behavior normal.        Thought Content: Thought content normal.        Judgment: Judgment normal.      No results found.  Assessment/plan: Sarah Fisher is a 61 y.o. female present for cmc Essential hypertension/HLD/overweight Stable Continue losartan 50 mg QD.  - low salt diet, exercise.  Continue pravastatin 10 mg daily Labs up-to-date - f/u 5.5 mos   Vit d def:  - pt taking 2000u daily of vit d, - vit d UTD   Low ferritin Iron supplement QOD - Iron, TIBC and Ferritin Panel>UTD Chronic cervical radiculopathy (Bilateral) (L>R)/Numbness of upper extremity (Bilateral) (L>R)/Neurogenic pain/Cervical central spinal stenosis C5-6 (Right)/Cervical DDD (degenerative disc disease) (C5-6)/Cervicalgia/Chronic pain syndrome Agreed to take over Lyrica prescription for her.  The rest of the medications will still come from her chronic pain management clinic. [place] controlled substance database reviewed and appropriate. -Continue pregabalin (LYRICA) 50 MG capsule; 50 mg during day, 100 mg qhs  Dispense: 90 capsule; Refill: 5 Patient understands this medication is considered a controlled substance and will require a face-to-face visit for refills.  Declined influenza vac today- will get later.  Return in about 24 weeks (around 09/04/2022) for Routine chronic condition follow-up.    No orders of the defined types were placed in this encounter.  Meds ordered this encounter  Medications   losartan (COZAAR) 50 MG tablet    Sig: Take 1 tablet (50 mg total) by mouth daily.    Dispense:  90 tablet    Refill:  1   pregabalin (LYRICA) 50 MG capsule    Sig: 50 mg during day, 100 mg qhs  Dispense:  90 capsule    Refill:  5   Referral Orders  No referral(s) requested today      Electronically signed by: Howard Pouch, [place]

## 2022-03-24 ENCOUNTER — Encounter: Payer: Self-pay | Admitting: Gastroenterology

## 2022-03-24 ENCOUNTER — Ambulatory Visit (AMBULATORY_SURGERY_CENTER): Payer: BC Managed Care – PPO | Admitting: Gastroenterology

## 2022-03-24 VITALS — BP 108/51 | HR 76 | Temp 97.8°F | Resp 11 | Ht 60.25 in | Wt 147.8 lb

## 2022-03-24 DIAGNOSIS — Z1211 Encounter for screening for malignant neoplasm of colon: Secondary | ICD-10-CM | POA: Diagnosis not present

## 2022-03-24 DIAGNOSIS — Z86010 Personal history of colonic polyps: Secondary | ICD-10-CM

## 2022-03-24 DIAGNOSIS — Z09 Encounter for follow-up examination after completed treatment for conditions other than malignant neoplasm: Secondary | ICD-10-CM | POA: Diagnosis not present

## 2022-03-24 MED ORDER — HYDROCORTISONE ACETATE 25 MG RE SUPP: 25.0000 mg | Freq: Every day | RECTAL | 0 refills | Status: DC

## 2022-03-24 MED ORDER — HYDROCORTISONE ACETATE 25 MG RE SUPP: 25.0000 mg | Freq: Every day | RECTAL | 0 refills | Status: AC | PRN

## 2022-03-24 MED ORDER — SODIUM CHLORIDE 0.9 % IV SOLN: 500.0000 mL | Freq: Once | INTRAVENOUS | Status: DC

## 2022-03-24 NOTE — Patient Instructions (Signed)
Handouts provided about hemorrhoids, hemorrhoid banding and diverticulosis.  Resume previous diet.  Continue present medications.  Repeat colonoscopy in 10 years for surveillance.  Use hydrocortisone suppository as directed.  Return to GI office at next available appointment for hemorrhoidal band ligation.    YOU HAD AN ENDOSCOPIC PROCEDURE TODAY AT [facility]:   Refer to the procedure report that was given to you for any specific questions about what was found during the examination.  If the procedure report does not answer your questions, please call your gastroenterologist to clarify.  If you requested that your care partner not be given the details of your procedure findings, then the procedure report has been included in a sealed envelope for you to review at your convenience later.  YOU SHOULD EXPECT: Some feelings of bloating in the abdomen. Passage of more gas than usual.  Walking can help get rid of the air that was put into your GI tract during the procedure and reduce the bloating. If you had a lower endoscopy (such as a colonoscopy or flexible sigmoidoscopy) you may notice spotting of blood in your stool or on the toilet paper. If you underwent a bowel prep for your procedure, you may not have a normal bowel movement for a few days.  Please Note:  You might notice some irritation and congestion in your nose or some drainage.  This is from the oxygen used during your procedure.  There is no need for concern and it should clear up in a day or so.  SYMPTOMS TO REPORT IMMEDIATELY:  Following lower endoscopy (colonoscopy or flexible sigmoidoscopy):  Excessive amounts of blood in the stool  Significant tenderness or worsening of abdominal pains  Swelling of the abdomen that is new, acute  Fever of 100F or higher  For urgent or emergent issues, a gastroenterologist can be reached at any hour by calling [phone]. Do not use MyChart messaging for urgent concerns.     DIET:  We do recommend a small meal at first, but then you may proceed to your regular diet.  Drink plenty of fluids but you should avoid alcoholic beverages for 24 hours.  ACTIVITY:  You should plan to take it easy for the rest of today and you should NOT DRIVE or use heavy machinery until tomorrow (because of the sedation medicines used during the test).    FOLLOW UP: Our staff will call the number listed on your records the next business day following your procedure.  We will call around 7:15- 8:00 am to check on you and address any questions or concerns that you may have regarding the information given to you following your procedure. If we do not reach you, we will leave a message.     If any biopsies were taken you will be contacted by phone or by letter within the next 1-3 weeks.  Please call us at [phone] if you have not heard about the biopsies in 3 weeks.    SIGNATURES/CONFIDENTIALITY: You and/or your care partner have signed paperwork which will be entered into your electronic medical record.  These signatures attest to the fact that that the information above on your After Visit Summary has been reviewed and is understood.  Full responsibility of the confidentiality of this discharge information lies with you and/or your care-partner.

## 2022-03-24 NOTE — Op Note (Signed)
[facility] Patient Name: Sarah Fisher Procedure Date: 03/24/2022 9:13 AM MRN: [id number] Endoscopist: Mauri Pole , MD Age: 61 Referring MD:  Date of Birth: 02-01-1961 Gender: Female Account #: [id number] Procedure:                Colonoscopy Indications:              High risk colon cancer surveillance: Personal                            history of colonic polyps, High risk colon cancer                            surveillance: Personal history of adenoma less than                            10 mm in size Medicines:                Monitored Anesthesia Care Procedure:                Pre-Anesthesia Assessment:                           - Prior to the procedure, a History and Physical                            was performed, and patient medications and                            allergies were reviewed. The patient's tolerance of                            previous anesthesia was also reviewed. The risks                            and benefits of the procedure and the sedation                            options and risks were discussed with the patient.                            All questions were answered, and informed consent                            was obtained. Prior Anticoagulants: The patient has                            taken no previous anticoagulant or antiplatelet                            agents. ASA Grade Assessment: II - A patient with                            mild systemic disease. After reviewing the risks  and benefits, the patient was deemed in                            satisfactory condition to undergo the procedure.                           After obtaining informed consent, the colonoscope                            was passed under direct vision. Throughout the                            procedure, the patient's blood pressure, pulse, and                            oxygen saturations were monitored  continuously. The                            Olympus PCF-[id number]) Colonoscope was                            introduced through the anus and advanced to the the                            cecum, identified by appendiceal orifice and                            ileocecal valve. The colonoscopy was performed                            without difficulty. The patient tolerated the                            procedure well. The quality of the bowel                            preparation was excellent. The ileocecal valve,                            appendiceal orifice, and rectum were photographed. Scope In: 9:16:46 AM Scope Out: 9:29:24 AM Scope Withdrawal Time: 0 hours 9 minutes 22 seconds  Total Procedure Duration: 0 hours 12 minutes 38 seconds  Findings:                 The perianal and digital rectal examinations were                            normal.                           A few small-mouthed diverticula were found in the                            sigmoid colon.  Non-bleeding internal hemorrhoids were found during                            retroflexion. The hemorrhoids were medium-sized. Complications:            No immediate complications. Estimated Blood Loss:     Estimated blood loss was minimal. Impression:               - Diverticulosis in the sigmoid colon.                           - Non-bleeding internal hemorrhoids.                           - No specimens collected. Recommendation:           - Patient has a contact number available for                            emergencies. The signs and symptoms of potential                            delayed complications were discussed with the                            patient. Return to normal activities tomorrow.                            Written discharge instructions were provided to the                            patient.                           - Resume previous diet.                            - Continue present medications.                           - Repeat colonoscopy in 10 years for surveillance.                           - Use hydrocortisone suppository 25 mg 1 per rectum                            once a day for 5 days PRN. Rx for 12 pack with 0                            refills                           - Return to GI office at the next available                            appointment for hemorrhoidal band ligation. Mauri Pole, MD 03/24/2022 9:33:57 AM This  report has been signed electronically.

## 2022-03-24 NOTE — Progress Notes (Signed)
[facility] History and Physical   Primary Care Physician:  Ma Hillock, DO   Reason for Procedure:  History of adenomatous colon polyps  Plan:    Surveillance colonoscopy with possible interventions as needed     HPI: Sarah Fisher is a very pleasant 61 y.o. adult here for surveillance colonoscopy. Denies any nausea, vomiting, abdominal pain, melena or bright red blood per rectum  The risks and benefits as well as alternatives of endoscopic procedure(s) have been discussed and reviewed. All questions answered. The patient agrees to proceed.    Past Medical History:  Diagnosis Date   Anemia    Arthritis    Cervical spine degeneration 2010   C5-6   History of carpal tunnel syndrome    Hyperlipidemia    Hypertension    Immune to varicella 11/12/2020   Sacroiliac joint pain 2010   right   Scoliosis    Skin cancer of eyelid 12/2021   Mohs    Past Surgical History:  Procedure Laterality Date   BREAST SURGERY  1986   reduction   cervical facet block  2010   HAND SURGERY Bilateral 2000 and 2016   benign tumor removal    I & D EXTREMITY Left 08/10/2017   Procedure: DEBRIDEMENT DISTAL INTERPHALANGEAL JOINT;  Surgeon: Leanora Cover, MD;  Location: [facility];  Service: Orthopedics;  Laterality: Left;   left finger surgery Left 08/2017   MASS EXCISION Left 08/10/2017   Procedure: LEFT LONG FINGER EXCISION MASS;  Surgeon: Leanora Cover, MD;  Location: [facility];  Service: Orthopedics;  Laterality: Left;   MOHS SURGERY Left 12/2021   eyelid and reconstruction   [place]    Prior to Admission medications   Medication Sig Start Date End Date Taking? Authorizing Provider  Cholecalciferol (VITAMIN D3 PO) Take by mouth.   Yes [provider]  ibuprofen (ADVIL,MOTRIN) 200 MG tablet Take 400 mg by mouth every 4 (four) hours as needed.   Yes [provider]  losartan (COZAAR) 50 MG tablet Take  1 tablet (50 mg total) by mouth daily. 03/20/22  Yes Kuneff, Renee A, DO  Multiple Vitamins-Minerals (MULTI-VITAMIN GUMMIES) CHEW Chew 1 Units by mouth daily.   Yes [provider]  pravastatin (PRAVACHOL) 10 MG tablet Take 1 tablet (10 mg total) by mouth daily. 10/03/21  Yes Kuneff, Renee A, DO  pregabalin (LYRICA) 50 MG capsule 50 mg during day, 100 mg qhs 03/20/22  Yes Kuneff, Renee A, DO  triamcinolone ointment (KENALOG) 0.5 % Apply 1 application topically 2 (two) times daily. 03/28/21  Yes Kuneff, Renee A, DO  Boswellia-Glucosamine-Vit D (OSTEO BI-FLEX ONE PER DAY PO) Take by mouth.    [provider]  clobetasol cream (TEMOVATE) 1.57 % Apply 1 Application topically 2 (two) times daily. 12/08/21   [provider]  COLLAGEN PO Take by mouth.    [provider]  diclofenac (VOLTAREN) 75 MG EC tablet Take 75 mg by mouth 2 (two) times daily.    [provider]  erythromycin ophthalmic ointment 3 (three) times daily. 12/18/21   [provider]  estradiol (ESTRACE) 0.1 MG/GM vaginal cream Place 0.5 g vaginally 2 (two) times a week. 04/03/20   [provider]  Ferrous Gluconate-C-Folic Acid (IRON-C PO) Take by mouth.    [provider]  tiZANidine (ZANAFLEX) 4 MG tablet Take 1 tablet (4 mg total) by mouth at bedtime as needed for muscle spasms. 09/04/21 02/26/23  Gillis Santa, MD  traMADol (ULTRAM) 50 MG tablet Take 50-100 mg by mouth every 12 (twelve) hours as needed.    [provider]  tretinoin (RETIN-A) 0.025 % cream Apply topically at bedtime. 12/08/21   [provider]    Current Outpatient Medications  Medication Sig Dispense Refill   Cholecalciferol (VITAMIN D3 PO) Take by mouth.     ibuprofen (ADVIL,MOTRIN) 200 MG tablet Take 400 mg by mouth every 4 (four) hours as needed.     losartan (COZAAR) 50 MG tablet Take 1 tablet (50 mg total) by mouth daily. 90 tablet 1   Multiple Vitamins-Minerals (MULTI-VITAMIN  GUMMIES) CHEW Chew 1 Units by mouth daily.     pravastatin (PRAVACHOL) 10 MG tablet Take 1 tablet (10 mg total) by mouth daily. 90 tablet 3   pregabalin (LYRICA) 50 MG capsule 50 mg during day, 100 mg qhs 90 capsule 5   triamcinolone ointment (KENALOG) 0.5 % Apply 1 application topically 2 (two) times daily. 30 g 5   Boswellia-Glucosamine-Vit D (OSTEO BI-FLEX ONE PER DAY PO) Take by mouth.     clobetasol cream (TEMOVATE) 5.46 % Apply 1 Application topically 2 (two) times daily.     COLLAGEN PO Take by mouth.     diclofenac (VOLTAREN) 75 MG EC tablet Take 75 mg by mouth 2 (two) times daily.     erythromycin ophthalmic ointment 3 (three) times daily.     estradiol (ESTRACE) 0.1 MG/GM vaginal cream Place 0.5 g vaginally 2 (two) times a week.     Ferrous Gluconate-C-Folic Acid (IRON-C PO) Take by mouth.     tiZANidine (ZANAFLEX) 4 MG tablet Take 1 tablet (4 mg total) by mouth at bedtime as needed for muscle spasms. 90 tablet 2   traMADol (ULTRAM) 50 MG tablet Take 50-100 mg by mouth every 12 (twelve) hours as needed.     tretinoin (RETIN-A) 0.025 % cream Apply topically at bedtime.     Current Facility-Administered Medications  Medication Dose Route Frequency Provider Last Rate Last Admin   0.9 %  sodium chloride infusion  500 mL Intravenous Once Mauri Pole, MD        Allergies as of 03/24/2022   (No Known Allergies)    Family History  Problem Relation Age of Onset   Hypertension Mother    Arthritis Mother    Hypertension Father    Liver cancer Father    Hypertension Sister    Arthritis Maternal Aunt    Hypertension Maternal Grandmother    Kidney cancer Maternal Grandmother    Alzheimer's disease Maternal Grandfather    Colon cancer Maternal Grandfather 76   Diabetes Maternal Grandfather    Alzheimer's disease Paternal Grandmother    Hypertension Brother    Diabetes Brother    Esophageal cancer Neg Hx    Rectal cancer Neg Hx    Stomach cancer Neg Hx     Social  History   Socioeconomic History   Marital status: Divorced    Spouse name: Not on file   Number of children: 3   Years of education: 16   Highest education level: Not on file  Occupational History   Occupation: housewife  Tobacco Use   Smoking status: Never   Smokeless tobacco: Never  Vaping Use   Vaping Use: Never used  Substance and Sexual Activity   Alcohol use: Yes    Comment: a glass of wine occasionally   Drug use: No   Sexual activity: Yes    Partners:  Male    Birth control/protection: None  Other Topics Concern   Not on file  Social History Narrative   Divorced. 3 children, one lives with her [name]).   She is from [place], moved in 2016.    Housewife. B.A. Degree.    Drinks caffeine, uses herbal remedies. Takes a daily vitamin.    Wears her seatbelt, bicycle helmet.Smoke detector in the home.    Exercises routinely.    Feels safe in her relationships.    Social Determinants of Health   Financial Resource Strain: Not on file  Food Insecurity: Not on file  Transportation Needs: Not on file  Physical Activity: Not on file  Stress: Not on file  Social Connections: Not on file  Intimate Partner Violence: Not on file    Review of Systems:  All other review of systems negative except as mentioned in the HPI.  Physical Exam: Vital signs in last 24 hours: BP 130/88   Pulse 72   Temp 97.8 F (36.6 C)   Ht 5' 0.25" (1.53 m)   Wt 147 lb 12.8 oz (67 kg)   LMP  (LMP Unknown)   SpO2 97%   BMI 28.63 kg/m  General:   Alert, NAD Lungs:  Clear .   Heart:  Regular rate and rhythm Abdomen:  Soft, nontender and nondistended. Neuro/Psych:  Alert and cooperative. Normal mood and affect. A and O x 3  Reviewed labs, radiology imaging, old records and pertinent past GI work up  Patient is appropriate for planned procedure(s) and anesthesia in an ambulatory setting   K. Denzil Magnuson , MD [phone]

## 2022-03-24 NOTE — Progress Notes (Signed)
Pt's states no medical or surgical changes since previsit or office visit. 

## 2022-03-24 NOTE — Progress Notes (Signed)
VSS, transported to PACU °

## 2022-03-25 ENCOUNTER — Telehealth: Payer: Self-pay | Admitting: *Deleted

## 2022-03-25 NOTE — Telephone Encounter (Signed)
  Follow up Call-    Row Labels 03/24/2022    8:13 AM  Call back number   Section Header. No data exists in this row.   Post procedure Call Back phone  #   [phone]  Permission to leave phone message   Yes     Patient questions:  Do you have a fever, pain , or abdominal swelling? No. Pain Score  0 *  Have you tolerated food without any problems? Yes.    Have you been able to return to your normal activities? Yes.    Do you have any questions about your discharge instructions: Diet   No. Medications  No. Follow up visit  No.  Do you have questions or concerns about your Care? No.  Actions: * If pain score is 4 or above: No action needed, pain <4.

## 2022-04-15 DIAGNOSIS — C441191 Basal cell carcinoma of skin of left upper eyelid, including canthus: Secondary | ICD-10-CM | POA: Diagnosis not present

## 2022-06-19 ENCOUNTER — Ambulatory Visit (INDEPENDENT_AMBULATORY_CARE_PROVIDER_SITE_OTHER): Payer: BC Managed Care – PPO

## 2022-06-19 DIAGNOSIS — Z23 Encounter for immunization: Secondary | ICD-10-CM | POA: Diagnosis not present

## 2022-06-20 ENCOUNTER — Other Ambulatory Visit: Payer: Self-pay | Admitting: Student in an Organized Health Care Education/Training Program

## 2022-06-20 DIAGNOSIS — G894 Chronic pain syndrome: Secondary | ICD-10-CM

## 2022-06-20 DIAGNOSIS — M47812 Spondylosis without myelopathy or radiculopathy, cervical region: Secondary | ICD-10-CM

## 2022-07-03 ENCOUNTER — Encounter: Payer: Self-pay | Admitting: Family Medicine

## 2022-08-19 ENCOUNTER — Other Ambulatory Visit: Payer: Self-pay

## 2022-09-03 ENCOUNTER — Ambulatory Visit: Payer: BC Managed Care – PPO | Admitting: Family Medicine

## 2022-09-03 ENCOUNTER — Encounter: Payer: Self-pay | Admitting: Family Medicine

## 2022-09-03 VITALS — BP 111/79 | HR 70 | Temp 98.1°F | Wt 144.0 lb

## 2022-09-03 DIAGNOSIS — E559 Vitamin D deficiency, unspecified: Secondary | ICD-10-CM

## 2022-09-03 DIAGNOSIS — M542 Cervicalgia: Secondary | ICD-10-CM

## 2022-09-03 DIAGNOSIS — I10 Essential (primary) hypertension: Secondary | ICD-10-CM

## 2022-09-03 DIAGNOSIS — E782 Mixed hyperlipidemia: Secondary | ICD-10-CM

## 2022-09-03 DIAGNOSIS — M5030 Other cervical disc degeneration, unspecified cervical region: Secondary | ICD-10-CM

## 2022-09-03 DIAGNOSIS — H9319 Tinnitus, unspecified ear: Secondary | ICD-10-CM

## 2022-09-03 DIAGNOSIS — G894 Chronic pain syndrome: Secondary | ICD-10-CM | POA: Diagnosis not present

## 2022-09-03 DIAGNOSIS — R1310 Dysphagia, unspecified: Secondary | ICD-10-CM

## 2022-09-03 LAB — COMPREHENSIVE METABOLIC PANEL
ALT: 16 U/L (ref 0–35)
AST: 18 U/L (ref 0–37)
Albumin: 4.1 g/dL (ref 3.5–5.2)
Alkaline Phosphatase: 58 U/L (ref 39–117)
BUN: 19 mg/dL (ref 6–23)
CO2: 29 mEq/L (ref 19–32)
Calcium: 9.6 mg/dL (ref 8.4–10.5)
Chloride: 104 mEq/L (ref 96–112)
Creatinine, Ser: 0.73 mg/dL (ref 0.40–1.20)
GFR: 88.42 mL/min (ref >60.00)
Glucose, Bld: 96 mg/dL (ref 70–99)
Potassium: 4.2 mEq/L (ref 3.5–5.1)
Sodium: 140 mEq/L (ref 135–145)
Total Bilirubin: 0.5 mg/dL (ref 0.2–1.2)
Total Protein: 6.5 g/dL (ref 6.0–8.3)

## 2022-09-03 LAB — LIPID PANEL
Cholesterol: 196 mg/dL (ref 0–200)
HDL: 68.1 mg/dL (ref >39.00)
LDL Cholesterol: 98 mg/dL (ref 0–99)
NonHDL: 127.4
Total CHOL/HDL Ratio: 3
Triglycerides: 145 mg/dL (ref 0.0–149.0)
VLDL: 29 mg/dL (ref 0.0–40.0)

## 2022-09-03 LAB — CBC
HCT: 36 % (ref 36.0–46.0)
Hemoglobin: 12.3 g/dL (ref 12.0–15.0)
MCHC: 34.1 g/dL (ref 30.0–36.0)
MCV: 87.3 fl (ref 78.0–100.0)
Platelets: 294 10*3/uL (ref 150.0–400.0)
RBC: 4.13 Mil/uL (ref 3.87–5.11)
RDW: 12.6 % (ref 11.5–15.5)
WBC: 5 10*3/uL (ref 4.0–10.5)

## 2022-09-03 LAB — TSH: TSH: 1.85 u[IU]/mL (ref 0.35–5.50)

## 2022-09-03 LAB — VITAMIN D 25 HYDROXY (VIT D DEFICIENCY, FRACTURES): VITD: 26.52 ng/mL — ABNORMAL LOW (ref 30.00–100.00)

## 2022-09-03 MED ORDER — DICLOFENAC SODIUM 75 MG PO TBEC: 75.0000 mg | DELAYED_RELEASE_TABLET | Freq: Two times a day (BID) | ORAL | 1 refills | Status: DC

## 2022-09-03 MED ORDER — LOSARTAN POTASSIUM 50 MG PO TABS: 50.0000 mg | ORAL_TABLET | Freq: Every day | ORAL | 1 refills | Status: DC

## 2022-09-03 MED ORDER — PRAVASTATIN SODIUM 10 MG PO TABS: 10.0000 mg | ORAL_TABLET | Freq: Every day | ORAL | 3 refills | Status: DC

## 2022-09-03 MED ORDER — PREGABALIN 50 MG PO CAPS: ORAL_CAPSULE | ORAL | 5 refills | Status: DC

## 2022-09-03 NOTE — Progress Notes (Signed)
Patient ID: Sarah Fisher, female  DOB: [birth year], 61 y.o.   MRN: [id number] Patient Care Team    Relationship Specialty Notifications Start End  Ma Hillock, DO PCP - General Family Medicine  04/14/16   Mauri Pole, MD Consulting Physician Gastroenterology  06/10/17   Milinda Pointer, MD Referring Physician Pain Medicine  06/10/17   Kassie Mends, MD Referring Physician Orthopedic Surgery  09/04/20   Roque Cash., MD  Obstetrics and Gynecology  10/03/21     Chief Complaint  Patient presents with   Hypertension    Subjective: Sarah Fisher is a 62 y.o.  Female  present for Chronic Conditions/illness Management and 2 new complaints.  All past medical history, surgical history, allergies, family history, immunizations, medications and social history were updated in the electronic medical record today. All recent labs, ED visits and hospitalizations within the last year were reviewed.  Hypertension/hyperlipidemia/overweight: Patient reports compliance with losartan 50 mg daily and pravastatin.   Patient denies chest pain, shortness of breath, dizziness or lower extremity edema.  Pt is  prescribed statin. Diet: low sodium- eating healthy- watching her weight Exercise:walking about a mile a day. RF: HLD, HTN, overweight   Chronic cervical radiculopathy (Bilateral) (L>R)/Numbness of upper extremity (Bilateral) (L>R)/Neurogenic pain/Cervical central spinal stenosis C5-6 (Right)/Cervical DDD (degenerative disc disease) (C5-6)/Cervicalgia/Chronic pain syndrome Patient has been prescribed Lyrica 50 mg in the day 100 mg nightly for her chronic pain/cervical radiculopathy and it is doing well for her.   This was managed by her chronic pain clinic recently she desired we take this over for her.  Dysphagia: Patient reports she has noticed over the last few months it is difficult for her to initiate swallowing on some occasions.  Does not seem to be focused on either  fluid or food can happen to either.  Tinnitus: Reports she has noticed more ringing in her ears.  She would like to be referred to ENT for further evaluation.  Has been prescribed chronic NSAIDs for her chronic pain.    10/03/2021   10:06 AM 09/04/2020    2:59 PM 06/15/2018    9:38 AM 04/05/2018    1:05 PM 03/16/2018    2:03 PM  Depression screen PHQ 2/9  Decreased Interest 0 0 0 0 0  Down, Depressed, Hopeless 0 0 0 0 0  PHQ - 2 Score 0 0 0 0 0       No data to display           Immunization History  Administered Date(s) Administered   Influenza Inj Mdck Quad Pf 04/15/2018   Influenza,inj,Quad PF,6+ Mos 04/14/2016, 06/03/2017, 04/13/2019, 03/28/2021, 06/19/2022   Influenza-Unspecified 06/07/2020   MMR 05/18/2018   PFIZER(Purple Top)SARS-COV-2 Vaccination 09/10/2019, 10/11/2019, 06/07/2020   Tdap 12/06/2014   Zoster Recombinat (Shingrix) 09/04/2020, 11/12/2020    Past Medical History:  Diagnosis Date   Anemia    Arthritis    Cervical spine degeneration 2010   C5-6   History of carpal tunnel syndrome    Hyperlipidemia    Hypertension    Immune to varicella 11/12/2020   Sacroiliac joint pain 2010   right   Scoliosis    Skin cancer of eyelid 12/2021   Mohs   No Known Allergies Past Surgical History:  Procedure Laterality Date   BREAST SURGERY  1986   reduction   cervical facet block  2010   HAND SURGERY Bilateral 2000 and 2016   benign tumor removal  I & D EXTREMITY Left 08/10/2017   Procedure: DEBRIDEMENT DISTAL INTERPHALANGEAL JOINT;  Surgeon: Leanora Cover, MD;  Location: [facility];  Service: Orthopedics;  Laterality: Left;   left finger surgery Left 08/2017   MASS EXCISION Left 08/10/2017   Procedure: LEFT LONG FINGER EXCISION MASS;  Surgeon: Leanora Cover, MD;  Location: [facility];  Service: Orthopedics;  Laterality: Left;   MOHS SURGERY Left 12/2021   eyelid and reconstruction   WISDOM TOOTH EXTRACTION  1979   Family  History  Problem Relation Age of Onset   Hypertension Mother    Arthritis Mother    Hypertension Father    Liver cancer Father    Hypertension Sister    Arthritis Maternal Aunt    Hypertension Maternal Grandmother    Kidney cancer Maternal Grandmother    Alzheimer's disease Maternal Grandfather    Colon cancer Maternal Grandfather 11   Diabetes Maternal Grandfather    Alzheimer's disease Paternal Grandmother    Hypertension Brother    Diabetes Brother    Esophageal cancer Neg Hx    Rectal cancer Neg Hx    Stomach cancer Neg Hx    Social History   Social History Narrative   Divorced. 3 children, one lives with her [name]).   She is from [place], moved in 2016.    Housewife. B.A. Degree.    Drinks caffeine, uses herbal remedies. Takes a daily vitamin.    Wears her seatbelt, bicycle helmet.Smoke detector in the home.    Exercises routinely.    Feels safe in her relationships.     Allergies as of 09/03/2022   No Known Allergies      Medication List        Accurate as of September 03, 2022  3:10 PM. If you have any questions, ask your nurse or doctor.          clobetasol cream 0.05 % Commonly known as: TEMOVATE Apply 1 Application topically 2 (two) times daily.   COLLAGEN PO Take by mouth.   diclofenac 75 MG EC tablet Commonly known as: VOLTAREN Take 1 tablet (75 mg total) by mouth 2 (two) times daily.   erythromycin ophthalmic ointment 3 (three) times daily.   estradiol 0.1 MG/GM vaginal cream Commonly known as: ESTRACE Place 0.5 g vaginally 2 (two) times a week.   ibuprofen 200 MG tablet Commonly known as: ADVIL Take 400 mg by mouth every 4 (four) hours as needed.   IRON-C PO Take by mouth.   losartan 50 MG tablet Commonly known as: COZAAR Take 1 tablet (50 mg total) by mouth daily.   Multi-Vitamin Gummies Chew Chew 1 Units by mouth daily.   OSTEO BI-FLEX ONE PER DAY PO Take by mouth.   pravastatin 10 MG tablet Commonly known as:  PRAVACHOL Take 1 tablet (10 mg total) by mouth daily.   pregabalin 50 MG capsule Commonly known as: Lyrica 50 mg during day, 100 mg qhs   tiZANidine 4 MG tablet Commonly known as: Zanaflex Take 1 tablet (4 mg total) by mouth at bedtime as needed for muscle spasms.   traMADol 50 MG tablet Commonly known as: ULTRAM Take 50-100 mg by mouth every 12 (twelve) hours as needed.   tretinoin 0.025 % cream Commonly known as: RETIN-A Apply topically at bedtime.   triamcinolone ointment 0.5 % Commonly known as: KENALOG Apply 1 application topically 2 (two) times daily.   VITAMIN D3 PO Take by mouth.  All past medical history, surgical history, allergies, family history, immunizations andmedications were updated in the EMR today and reviewed under the history and medication portions of their EMR.     No results found for this or any previous visit (from the past 2160 hour(s)).  ROS 14 pt review of systems performed and negative (unless mentioned in an HPI)  Objective: BP 111/79   Pulse 70   Temp 98.1 F (36.7 C)   Wt 144 lb (65.3 kg)   LMP  (LMP Unknown)   SpO2 97%   BMI 27.89 kg/m  Physical Exam Vitals and nursing note reviewed.  Constitutional:      General: She is not in acute distress.    Appearance: Normal appearance. She is not ill-appearing, toxic-appearing or diaphoretic.  HENT:     Head: Normocephalic and atraumatic.  Eyes:     General: No scleral icterus.       Right eye: No discharge.        Left eye: No discharge.     Extraocular Movements: Extraocular movements intact.     Conjunctiva/sclera: Conjunctivae normal.     Pupils: Pupils are equal, round, and reactive to light.  Cardiovascular:     Rate and Rhythm: Normal rate and regular rhythm.  Pulmonary:     Effort: Pulmonary effort is normal. No respiratory distress.     Breath sounds: Normal breath sounds. No wheezing, rhonchi or rales.  Musculoskeletal:     Cervical back: Neck supple. No  tenderness.     Right lower leg: No edema.     Left lower leg: No edema.  Lymphadenopathy:     Cervical: No cervical adenopathy.  Skin:    General: Skin is warm and dry.     Coloration: Skin is not jaundiced or pale.     Findings: No erythema or rash.  Neurological:     Mental Status: She is alert and oriented to person, place, and time. Mental status is at baseline.     Motor: No weakness.     Gait: Gait normal.  Psychiatric:        Mood and Affect: Mood normal.        Behavior: Behavior normal.        Thought Content: Thought content normal.        Judgment: Judgment normal.      No results found.  Assessment/plan: Sarah Fisher is a 62 y.o. female present for Chronic Conditions/illness Management Essential hypertension/HLD/overweight Stable Continue losartan 50 mg QD.  - low salt diet, exercise.  Continue pravastatin 10 mg daily Labs:cbc, cmp, tsh, lipid collected today> non-fasting.  - f/u 5.5 mos   Vit d def:  - pt taking 2000u daily of vit d, - vit d UTD   Low ferritin Iron supplement QOD - Iron, TIBC and Ferritin Panel>UTD  Tinnitus: Referred to ENT for further eval.   Dysphagia: Referred to her GI for further eval . Discussed possibility of GI vs neurological causes.   Chronic cervical radiculopathy (Bilateral) (L>R)/Numbness of upper extremity (Bilateral) (L>R)/Neurogenic pain/Cervical central spinal stenosis C5-6 (Right)/Cervical DDD (degenerative disc disease) (C5-6)/Cervicalgia/Chronic pain syndrome Agreed to take over Lyrica prescription for her.  The rest of the medications will still come from her chronic pain management clinic. [place] controlled substance database reviewed and appropriate -Continue pregabalin (LYRICA) 50 MG capsule; 50 mg during day, 100 mg qhs  Dispense: 90 capsule; Refill: 5 Patient understands this medication is considered a controlled substance and will require a  face-to-face visit for refills.    Return in 24  weeks (on 02/18/2023) for Routine chronic condition follow-up.  41 minutes spent with patient today Multiple chronic conditions, controlled substance, taking over prescriptions by other providers and covering to new acute issues.  Orders Placed This Encounter  Procedures   CBC   Comp Met (CMET)   TSH   Lipid panel   Vitamin D (25 hydroxy)   Ambulatory referral to ENT   Ambulatory referral to Gastroenterology   Meds ordered this encounter  Medications   losartan (COZAAR) 50 MG tablet    Sig: Take 1 tablet (50 mg total) by mouth daily.    Dispense:  90 tablet    Refill:  1   pravastatin (PRAVACHOL) 10 MG tablet    Sig: Take 1 tablet (10 mg total) by mouth daily.    Dispense:  90 tablet    Refill:  3   pregabalin (LYRICA) 50 MG capsule    Sig: 50 mg during day, 100 mg qhs    Dispense:  90 capsule    Refill:  5   diclofenac (VOLTAREN) 75 MG EC tablet    Sig: Take 1 tablet (75 mg total) by mouth 2 (two) times daily.    Dispense:  180 tablet    Refill:  1   Referral Orders         Ambulatory referral to ENT         Ambulatory referral to Gastroenterology        Electronically signed by: Howard Pouch, DO [place]

## 2022-09-03 NOTE — Patient Instructions (Addendum)
Return in 24 weeks (on 02/18/2023) for Routine chronic condition follow-up.        Great to see you today.  I have refilled the medication(s) we provide.   If labs were collected, we will inform you of lab results once received either by echart message or telephone call.   - echart message- for normal results that have been seen by the patient already.   - telephone call: abnormal results or if patient has not viewed results in their echart.

## 2022-09-04 ENCOUNTER — Ambulatory Visit: Payer: BC Managed Care – PPO | Admitting: Family Medicine

## 2022-09-04 ENCOUNTER — Telehealth: Payer: Self-pay | Admitting: Family Medicine

## 2022-09-04 NOTE — Telephone Encounter (Signed)
Pt understood lab results and recommendations She has no questions or concerns at this time.

## 2022-09-04 NOTE — Telephone Encounter (Signed)
noted 

## 2022-10-06 ENCOUNTER — Other Ambulatory Visit: Payer: Self-pay | Admitting: Student in an Organized Health Care Education/Training Program

## 2022-10-21 DIAGNOSIS — C441191 Basal cell carcinoma of skin of left upper eyelid, including canthus: Secondary | ICD-10-CM | POA: Diagnosis not present

## 2022-10-23 DIAGNOSIS — Z1231 Encounter for screening mammogram for malignant neoplasm of breast: Secondary | ICD-10-CM | POA: Diagnosis not present

## 2022-10-23 LAB — HM MAMMOGRAPHY

## 2022-10-27 ENCOUNTER — Telehealth: Payer: Self-pay

## 2022-11-13 ENCOUNTER — Other Ambulatory Visit: Payer: Self-pay

## 2022-11-13 ENCOUNTER — Emergency Department (HOSPITAL_BASED_OUTPATIENT_CLINIC_OR_DEPARTMENT_OTHER): Payer: BC Managed Care – PPO

## 2022-11-13 ENCOUNTER — Encounter (HOSPITAL_BASED_OUTPATIENT_CLINIC_OR_DEPARTMENT_OTHER): Payer: Self-pay

## 2022-11-13 DIAGNOSIS — M25511 Pain in right shoulder: Secondary | ICD-10-CM | POA: Diagnosis not present

## 2022-11-13 DIAGNOSIS — X19XXXA Contact with other heat and hot substances, initial encounter: Secondary | ICD-10-CM | POA: Insufficient documentation

## 2022-11-13 DIAGNOSIS — Z041 Encounter for examination and observation following transport accident: Secondary | ICD-10-CM | POA: Diagnosis not present

## 2022-11-13 DIAGNOSIS — S134XXA Sprain of ligaments of cervical spine, initial encounter: Secondary | ICD-10-CM | POA: Diagnosis not present

## 2022-11-13 DIAGNOSIS — M79631 Pain in right forearm: Secondary | ICD-10-CM | POA: Diagnosis not present

## 2022-11-13 DIAGNOSIS — I10 Essential (primary) hypertension: Secondary | ICD-10-CM | POA: Diagnosis not present

## 2022-11-13 DIAGNOSIS — T22211A Burn of second degree of right forearm, initial encounter: Secondary | ICD-10-CM | POA: Diagnosis not present

## 2022-11-13 DIAGNOSIS — T22212A Burn of second degree of left forearm, initial encounter: Secondary | ICD-10-CM | POA: Diagnosis not present

## 2022-11-13 DIAGNOSIS — M25512 Pain in left shoulder: Secondary | ICD-10-CM | POA: Insufficient documentation

## 2022-11-13 DIAGNOSIS — M25532 Pain in left wrist: Secondary | ICD-10-CM | POA: Diagnosis not present

## 2022-11-13 DIAGNOSIS — T22011A Burn of unspecified degree of right forearm, initial encounter: Secondary | ICD-10-CM | POA: Diagnosis not present

## 2022-11-13 DIAGNOSIS — Y92410 Unspecified street and highway as the place of occurrence of the external cause: Secondary | ICD-10-CM | POA: Diagnosis not present

## 2022-11-13 DIAGNOSIS — Z79899 Other long term (current) drug therapy: Secondary | ICD-10-CM | POA: Insufficient documentation

## 2022-11-13 DIAGNOSIS — R079 Chest pain, unspecified: Secondary | ICD-10-CM | POA: Diagnosis not present

## 2022-11-13 DIAGNOSIS — T22111A Burn of first degree of right forearm, initial encounter: Secondary | ICD-10-CM | POA: Diagnosis not present
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
## 2022-11-14 ENCOUNTER — Emergency Department (HOSPITAL_BASED_OUTPATIENT_CLINIC_OR_DEPARTMENT_OTHER): Payer: BC Managed Care – PPO

## 2022-11-14 ENCOUNTER — Ambulatory Visit: Payer: BC Managed Care – PPO | Admitting: Family Medicine

## 2022-11-14 ENCOUNTER — Emergency Department (HOSPITAL_BASED_OUTPATIENT_CLINIC_OR_DEPARTMENT_OTHER)
Admission: EM | Admit: 2022-11-14 | Discharge: 2022-11-14 | Disposition: A | Discharge status: Home / Self Care | Payer: BC Managed Care – PPO | Attending: Emergency Medicine | Admitting: Emergency Medicine

## 2022-11-14 ENCOUNTER — Emergency Department (HOSPITAL_BASED_OUTPATIENT_CLINIC_OR_DEPARTMENT_OTHER): Payer: BC Managed Care – PPO | Admitting: Radiology

## 2022-11-14 ENCOUNTER — Encounter: Payer: Self-pay | Admitting: Family Medicine

## 2022-11-14 ENCOUNTER — Telehealth: Payer: Self-pay

## 2022-11-14 VITALS — BP 120/84 | HR 77 | Temp 98.0°F | Wt 139.6 lb

## 2022-11-14 DIAGNOSIS — S139XXA Sprain of joints and ligaments of unspecified parts of neck, initial encounter: Secondary | ICD-10-CM

## 2022-11-14 DIAGNOSIS — Z041 Encounter for examination and observation following transport accident: Secondary | ICD-10-CM | POA: Diagnosis not present

## 2022-11-14 DIAGNOSIS — T22011A Burn of unspecified degree of right forearm, initial encounter: Secondary | ICD-10-CM | POA: Diagnosis not present

## 2022-11-14 DIAGNOSIS — M25512 Pain in left shoulder: Secondary | ICD-10-CM | POA: Diagnosis not present

## 2022-11-14 DIAGNOSIS — W2211XA Striking against or struck by driver side automobile airbag, initial encounter: Secondary | ICD-10-CM | POA: Diagnosis not present

## 2022-11-14 DIAGNOSIS — M25511 Pain in right shoulder: Secondary | ICD-10-CM | POA: Diagnosis not present

## 2022-11-14 DIAGNOSIS — T22111A Burn of first degree of right forearm, initial encounter: Secondary | ICD-10-CM

## 2022-11-14 NOTE — Telephone Encounter (Signed)
TOC completed 

## 2022-11-14 NOTE — Patient Instructions (Signed)
No follow-ups on file.        Great to see you today.  I have refilled the medication(s) we provide.   If labs were collected, we will inform you of lab results once received either by echart message or telephone call.   - echart message- for normal results that have been seen by the patient already.   - telephone call: abnormal results or if patient has not viewed results in their echart.  

## 2022-11-14 NOTE — Discharge Instructions (Addendum)
Apply ice to sore areas.  Ice to be applied for 30 minutes at a time, 4 times a day.  You may take ibuprofen or naproxen as needed for pain.  If you need additional pain relief, add acetaminophen.

## 2022-11-14 NOTE — ED Notes (Signed)
Reviewed AVS with patient, patient expressed understanding of directions, denies further questions at this time. Skin tear to right wrist dressed with non-adherent pad, rolled gauze, secured with tape. Reviewed wound care with patient.
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
## 2023-02-04 ENCOUNTER — Encounter (INDEPENDENT_AMBULATORY_CARE_PROVIDER_SITE_OTHER): Payer: Self-pay

## 2023-03-03 ENCOUNTER — Other Ambulatory Visit: Payer: Self-pay

## 2023-03-03 MED ORDER — LOSARTAN POTASSIUM 50 MG PO TABS: 50.0000 mg | ORAL_TABLET | Freq: Every day | ORAL | 0 refills | Status: DC

## 2023-03-04 ENCOUNTER — Ambulatory Visit: Payer: BC Managed Care – PPO | Admitting: Family Medicine

## 2023-03-11 ENCOUNTER — Other Ambulatory Visit: Payer: Self-pay

## 2023-03-11 DIAGNOSIS — G8929 Other chronic pain: Secondary | ICD-10-CM

## 2023-03-11 MED ORDER — PREGABALIN 50 MG PO CAPS: ORAL_CAPSULE | ORAL | 0 refills | Status: DC

## 2023-03-11 NOTE — Telephone Encounter (Signed)
Patient had appt with Dr. Claiborne Billings on 8/28 and resch to 9/10.  Patient states she has 2 pills left of pregabalin (LYRICA) 50 MG capsule .  Patient will need meds to get her thru to appt.  [id number] - , [place] - [address] AT [facility] [address] [facility] [address]

## 2023-03-11 NOTE — Telephone Encounter (Signed)
Please fill for 7 d/s for pt to make to appt

## 2023-03-12 ENCOUNTER — Other Ambulatory Visit: Payer: Self-pay | Admitting: Family

## 2023-03-12 DIAGNOSIS — G8929 Other chronic pain: Secondary | ICD-10-CM

## 2023-03-12 MED ORDER — PREGABALIN 50 MG PO CAPS
50.0000 mg | ORAL_CAPSULE | Freq: Three times a day (TID) | ORAL | 0 refills | Status: DC
Start: 2023-03-12 — End: 2023-03-12

## 2023-03-12 MED ORDER — PREGABALIN 50 MG PO CAPS: 50.0000 mg | ORAL_CAPSULE | Freq: Three times a day (TID) | ORAL | 0 refills | Status: DC

## 2023-03-12 NOTE — Telephone Encounter (Signed)
Patient called back to make sure we received message.  She takes 3 pills daily, so prescription that was sent in is only going to last her 2 days. Her appt with Dr. Claiborne Billings is on 9/10.  Please advise.  Call patient or send message on mychart on what she needs to do.

## 2023-03-13 NOTE — Telephone Encounter (Signed)
New Rx sent.

## 2023-03-17 ENCOUNTER — Encounter: Payer: Self-pay | Admitting: Family Medicine

## 2023-03-17 ENCOUNTER — Ambulatory Visit: Payer: BC Managed Care – PPO | Admitting: Family Medicine

## 2023-03-17 VITALS — BP 120/78 | HR 59 | Temp 97.6°F | Wt 141.0 lb

## 2023-03-17 DIAGNOSIS — M5412 Radiculopathy, cervical region: Secondary | ICD-10-CM | POA: Diagnosis not present

## 2023-03-17 DIAGNOSIS — Z23 Encounter for immunization: Secondary | ICD-10-CM

## 2023-03-17 DIAGNOSIS — E559 Vitamin D deficiency, unspecified: Secondary | ICD-10-CM

## 2023-03-17 DIAGNOSIS — R202 Paresthesia of skin: Secondary | ICD-10-CM

## 2023-03-17 DIAGNOSIS — E782 Mixed hyperlipidemia: Secondary | ICD-10-CM | POA: Diagnosis not present

## 2023-03-17 DIAGNOSIS — I10 Essential (primary) hypertension: Secondary | ICD-10-CM | POA: Diagnosis not present

## 2023-03-17 DIAGNOSIS — M79602 Pain in left arm: Secondary | ICD-10-CM

## 2023-03-17 DIAGNOSIS — M79601 Pain in right arm: Secondary | ICD-10-CM

## 2023-03-17 DIAGNOSIS — R200 Anesthesia of skin: Secondary | ICD-10-CM

## 2023-03-17 MED ORDER — PREGABALIN 50 MG PO CAPS: ORAL_CAPSULE | ORAL | 5 refills | Status: DC

## 2023-03-17 MED ORDER — LOSARTAN POTASSIUM 50 MG PO TABS: 50.0000 mg | ORAL_TABLET | Freq: Every day | ORAL | 1 refills | Status: DC

## 2023-03-17 MED ORDER — DICLOFENAC SODIUM 75 MG PO TBEC: 75.0000 mg | DELAYED_RELEASE_TABLET | Freq: Two times a day (BID) | ORAL | 1 refills | Status: DC

## 2023-03-17 NOTE — Progress Notes (Signed)
Patient ID: Sarah Fisher, female  DOB: [birth year], 62 y.o.   MRN: [id number] Patient Care Team    Relationship Specialty Notifications Start End  Natalia Leatherwood, DO PCP - General Family Medicine  04/14/16   Napoleon Form, MD Consulting Physician Gastroenterology  06/10/17   Delano Metz, MD Referring Physician Pain Medicine  06/10/17   Trudie Buckler, MD Referring Physician Orthopedic Surgery  09/04/20   Ola Spurr., MD  Obstetrics and Gynecology  10/03/21     Chief Complaint  Patient presents with   Medical Management of Chronic Issues    Subjective: CARMYNN BUIE is a 62 y.o.  Female  present for Chronic Conditions/illness Management   All past medical history, surgical history, allergies, family history, immunizations, medications and social history were updated in the electronic medical record today. All recent labs, ED visits and hospitalizations within the last year were reviewed.  Hypertension/hyperlipidemia/overweight: Patient reports compliance with  losartan 50 mg daily and pravastatin.   Patient denies chest pain, shortness of breath, dizziness or lower extremity edema.  Pt is  prescribed statin. Diet: low sodium- eating healthy- watching her weight Exercise:walking about a mile a day. RF: HLD, HTN, overweight   Chronic cervical radiculopathy (Bilateral) (L>R)/Numbness of upper extremity (Bilateral) (L>R)/Neurogenic pain/Cervical central spinal stenosis C5-6 (Right)/Cervical DDD (degenerative disc disease) (C5-6)/Cervicalgia/Chronic pain syndrome Patient has been prescribed Lyrica 50 mg in the day 100 mg nightly for her chronic pain/cervical radiculopathy and it is working well for her pain This was managed by her chronic pain clinic recently she desired we take this over for her.      11/14/2022    3:46 PM 09/03/2022   11:23 AM 10/03/2021   10:06 AM 09/04/2020    2:59 PM 06/15/2018    9:38 AM  Depression screen PHQ 2/9  Decreased Interest 0 0 0  0 0  Down, Depressed, Hopeless 0 0 0 0 0  PHQ - 2 Score 0 0 0 0 0       No data to display           Immunization History  Administered Date(s) Administered   Influenza Inj Mdck Quad Pf 04/15/2018   Influenza, Seasonal, Injecte, Preservative Fre 03/17/2023   Influenza,inj,Quad PF,6+ Mos 04/14/2016, 06/03/2017, 04/13/2019, 03/28/2021, 06/19/2022   Influenza-Unspecified 06/07/2020   MMR 05/18/2018   PFIZER(Purple Top)SARS-COV-2 Vaccination 09/10/2019, 10/11/2019, 06/07/2020   Tdap 12/06/2014   Zoster Recombinant(Shingrix) 09/04/2020, 11/12/2020    Past Medical History:  Diagnosis Date   Anemia    Arthritis    Cervical spine degeneration 2010   C5-6   History of carpal tunnel syndrome    Hyperlipidemia    Hypertension    Immune to varicella 11/12/2020   Sacroiliac joint pain 2010   right   Scoliosis    Skin cancer of eyelid 12/2021   Mohs   No Known Allergies Past Surgical History:  Procedure Laterality Date   BREAST SURGERY  1986   reduction   cervical facet block  2010   HAND SURGERY Bilateral 2000 and 2016   benign tumor removal    I & D EXTREMITY Left 08/10/2017   Procedure: DEBRIDEMENT DISTAL INTERPHALANGEAL JOINT;  Surgeon: Betha Loa, MD;  Location: [facility];  Service: Orthopedics;  Laterality: Left;   left finger surgery Left 08/2017   MASS EXCISION Left 08/10/2017   Procedure: LEFT LONG FINGER EXCISION MASS;  Surgeon: Betha Loa, MD;  Location: [facility];  Service: Orthopedics;  Laterality: Left;   MOHS SURGERY Left 12/2021   eyelid and reconstruction   WISDOM TOOTH EXTRACTION  1979   Family History  Problem Relation Age of Onset   Hypertension Mother    Arthritis Mother    Hypertension Father    Liver cancer Father    Hypertension Sister    Arthritis Maternal Aunt    Hypertension Maternal Grandmother    Kidney cancer Maternal Grandmother    Alzheimer's disease Maternal Grandfather    Colon cancer Maternal  Grandfather 68   Diabetes Maternal Grandfather    Alzheimer's disease Paternal Grandmother    Hypertension Brother    Diabetes Brother    Esophageal cancer Neg Hx    Rectal cancer Neg Hx    Stomach cancer Neg Hx    Social History   Social History Narrative   Divorced. 3 children, one lives with her Lars Mage).   She is from [place] moved in [place].    Housewife. B.A. Degree.    Drinks caffeine, uses herbal remedies. Takes a daily vitamin.    Wears her seatbelt, bicycle helmet.Smoke detector in the home.    Exercises routinely.    Feels safe in her relationships.     Allergies as of 03/17/2023   No Known Allergies      Medication List        Accurate as of March 17, 2023  8:40 AM. If you have any questions, ask your nurse or doctor.          STOP taking these medications    ibuprofen 200 MG tablet Commonly known as: ADVIL Stopped by: Felix Pacini       TAKE these medications    clobetasol cream 0.05 % Commonly known as: TEMOVATE Apply 1 Application topically 2 (two) times daily.   COLLAGEN PO Take by mouth.   diclofenac 75 MG EC tablet Commonly known as: VOLTAREN Take 1 tablet (75 mg total) by mouth 2 (two) times daily.   estradiol 0.1 MG/GM vaginal cream Commonly known as: ESTRACE Place 0.5 g vaginally 2 (two) times a week.   IRON-C PO Take by mouth.   losartan 50 MG tablet Commonly known as: COZAAR Take 1 tablet (50 mg total) by mouth daily.   Multi-Vitamin Gummies Chew Chew 1 Units by mouth daily.   OSTEO BI-FLEX ONE PER DAY PO Take by mouth.   pravastatin 10 MG tablet Commonly known as: PRAVACHOL Take 1 tablet (10 mg total) by mouth daily.   pregabalin 50 MG capsule Commonly known as: Lyrica 50 mg during day, 100 mg qhs What changed: Another medication with the same name was removed. Continue taking this medication, and follow the directions you see here. Changed by: Felix Pacini   traMADol 50 MG tablet Commonly known as:  ULTRAM Take 50-100 mg by mouth every 12 (twelve) hours as needed.   tretinoin 0.025 % cream Commonly known as: RETIN-A Apply topically at bedtime.   triamcinolone ointment 0.5 % Commonly known as: KENALOG Apply 1 application topically 2 (two) times daily.   VITAMIN D3 PO Take by mouth.        All past medical history, surgical history, allergies, family history, immunizations andmedications were updated in the EMR today and reviewed under the history and medication portions of their EMR.     No results found for this or any previous visit (from the past 2160 hour(s)).  ROS 14 pt review of systems performed and negative (unless mentioned in an HPI)  Objective: BP 120/78   Pulse (!) 59   Temp 97.6 F (36.4 C)   Wt 141 lb (64 kg)   LMP  (LMP Unknown)   SpO2 98%   BMI 27.54 kg/m  Physical Exam Vitals and nursing note reviewed.  Constitutional:      General: She is not in acute distress.    Appearance: Normal appearance. She is not ill-appearing, toxic-appearing or diaphoretic.  HENT:     Head: Normocephalic and atraumatic.  Eyes:     General: No scleral icterus.       Right eye: No discharge.        Left eye: No discharge.     Extraocular Movements: Extraocular movements intact.     Conjunctiva/sclera: Conjunctivae normal.     Pupils: Pupils are equal, round, and reactive to light.  Cardiovascular:     Rate and Rhythm: Normal rate and regular rhythm.  Pulmonary:     Effort: Pulmonary effort is normal. No respiratory distress.     Breath sounds: Normal breath sounds. No wheezing, rhonchi or rales.  Musculoskeletal:     Cervical back: Neck supple. No tenderness.     Right lower leg: No edema.     Left lower leg: No edema.  Lymphadenopathy:     Cervical: No cervical adenopathy.  Skin:    General: Skin is warm and dry.     Coloration: Skin is not jaundiced or pale.     Findings: No erythema or rash.  Neurological:     Mental Status: She is alert and oriented  to person, place, and time. Mental status is at baseline.     Motor: No weakness.     Gait: Gait normal.  Psychiatric:        Mood and Affect: Mood normal.        Behavior: Behavior normal.        Thought Content: Thought content normal.        Judgment: Judgment normal.      No results found.  Assessment/plan: OLIVER MAIDONADO is a 62 y.o. female present for Chronic Conditions/illness Management Essential hypertension/HLD/overweight Stable Continue current losartan 50 mg QD.  - low salt diet, exercise.  Continue pravastatin 10 mg daily Labs: Due next visit - f/u 5.5 mos   Vit d def:  - pt taking 2000u daily of vit d, - vit d UTD   Low ferritin Iron supplement QOD - Iron, TIBC and Ferritin Panel>UTD  Chronic cervical radiculopathy (Bilateral) (L>R)/Numbness of upper extremity (Bilateral) (L>R)/Neurogenic pain/Cervical central spinal stenosis C5-6 (Right)/Cervical DDD (degenerative disc disease) (C5-6)/Cervicalgia/Chronic pain syndrome stable Agreed to take over Lyrica prescription for her.  The rest of the medications will still come from her chronic pain management clinic. [place] controlled substance database reviewed and appropriate -continue  pregabalin (LYRICA) 50 MG capsule; 50 mg during day, 100 mg qhs  Dispense: 90 capsule; Refill: 5 Patient understands this medication is considered a controlled substance and will require a face-to-face visit for refills.   Return in about 24 weeks (around 09/01/2023) for Routine chronic condition follow-up.    Orders Placed This Encounter  Procedures   Flu vaccine trivalent PF, 6mos and older(Flulaval,Afluria,Fluarix,Fluzone)   Meds ordered this encounter  Medications   pregabalin (LYRICA) 50 MG capsule    Sig: 50 mg during day, 100 mg qhs    Dispense:  90 capsule    Refill:  5   diclofenac (VOLTAREN) 75 MG EC tablet    Sig: Take 1  tablet (75 mg total) by mouth 2 (two) times daily.    Dispense:  180 tablet     Refill:  1   losartan (COZAAR) 50 MG tablet    Sig: Take 1 tablet (50 mg total) by mouth daily.    Dispense:  90 tablet    Refill:  1   Referral Orders  No referral(s) requested today       Electronically signed by: Felix Pacini, DO [facility] [place]

## 2023-03-17 NOTE — Patient Instructions (Addendum)

## 2023-09-01 ENCOUNTER — Ambulatory Visit: Payer: BC Managed Care – PPO | Admitting: Family Medicine

## 2023-09-01 ENCOUNTER — Encounter: Payer: Self-pay | Admitting: Family Medicine

## 2023-09-01 VITALS — BP 118/72 | HR 68 | Temp 98.0°F | Wt 144.2 lb

## 2023-09-01 DIAGNOSIS — E663 Overweight: Secondary | ICD-10-CM

## 2023-09-01 DIAGNOSIS — I10 Essential (primary) hypertension: Secondary | ICD-10-CM

## 2023-09-01 DIAGNOSIS — R790 Abnormal level of blood mineral: Secondary | ICD-10-CM

## 2023-09-01 DIAGNOSIS — E559 Vitamin D deficiency, unspecified: Secondary | ICD-10-CM

## 2023-09-01 DIAGNOSIS — M79602 Pain in left arm: Secondary | ICD-10-CM

## 2023-09-01 DIAGNOSIS — M4692 Unspecified inflammatory spondylopathy, cervical region: Secondary | ICD-10-CM

## 2023-09-01 DIAGNOSIS — R200 Anesthesia of skin: Secondary | ICD-10-CM

## 2023-09-01 DIAGNOSIS — M792 Neuralgia and neuritis, unspecified: Secondary | ICD-10-CM

## 2023-09-01 DIAGNOSIS — G8929 Other chronic pain: Secondary | ICD-10-CM

## 2023-09-01 DIAGNOSIS — G894 Chronic pain syndrome: Secondary | ICD-10-CM

## 2023-09-01 DIAGNOSIS — M542 Cervicalgia: Secondary | ICD-10-CM

## 2023-09-01 DIAGNOSIS — M5412 Radiculopathy, cervical region: Secondary | ICD-10-CM

## 2023-09-01 DIAGNOSIS — R202 Paresthesia of skin: Secondary | ICD-10-CM

## 2023-09-01 DIAGNOSIS — M79601 Pain in right arm: Secondary | ICD-10-CM

## 2023-09-01 DIAGNOSIS — M4802 Spinal stenosis, cervical region: Secondary | ICD-10-CM

## 2023-09-01 DIAGNOSIS — E782 Mixed hyperlipidemia: Secondary | ICD-10-CM

## 2023-09-01 LAB — CBC
HCT: 35 % — ABNORMAL LOW (ref 36.0–46.0)
Hemoglobin: 11.8 g/dL — ABNORMAL LOW (ref 12.0–15.0)
MCHC: 33.8 g/dL (ref 30.0–36.0)
MCV: 89.4 fL (ref 78.0–100.0)
Platelets: 287 10*3/uL (ref 150.0–400.0)
RBC: 3.91 Mil/uL (ref 3.87–5.11)
RDW: 12.7 % (ref 11.5–15.5)
WBC: 4.7 10*3/uL (ref 4.0–10.5)

## 2023-09-01 LAB — IBC + FERRITIN
Ferritin: 56.7 ng/mL (ref 10.0–291.0)
Iron: 116 ug/dL (ref 42–145)
Saturation Ratios: 29.9 % (ref 20.0–50.0)
TIBC: 387.8 ug/dL (ref 250.0–450.0)
Transferrin: 277 mg/dL (ref 212.0–360.0)

## 2023-09-01 LAB — COMPREHENSIVE METABOLIC PANEL
ALT: 20 U/L (ref 0–35)
AST: 20 U/L (ref 0–37)
Albumin: 4.3 g/dL (ref 3.5–5.2)
Alkaline Phosphatase: 56 U/L (ref 39–117)
BUN: 21 mg/dL (ref 6–23)
CO2: 29 meq/L (ref 19–32)
Calcium: 9 mg/dL (ref 8.4–10.5)
Chloride: 105 meq/L (ref 96–112)
Creatinine, Ser: 0.77 mg/dL (ref 0.40–1.20)
GFR: 82.36 mL/min (ref >60.00)
Glucose, Bld: 87 mg/dL (ref 70–99)
Potassium: 4.4 meq/L (ref 3.5–5.1)
Sodium: 142 meq/L (ref 135–145)
Total Bilirubin: 0.6 mg/dL (ref 0.2–1.2)
Total Protein: 6.5 g/dL (ref 6.0–8.3)

## 2023-09-01 LAB — LIPID PANEL
Cholesterol: 211 mg/dL — ABNORMAL HIGH (ref 0–200)
HDL: 69.1 mg/dL (ref >39.00)
LDL Cholesterol: 116 mg/dL — ABNORMAL HIGH (ref 0–99)
NonHDL: 141.71
Total CHOL/HDL Ratio: 3
Triglycerides: 128 mg/dL (ref 0.0–149.0)
VLDL: 25.6 mg/dL (ref 0.0–40.0)

## 2023-09-01 LAB — VITAMIN D 25 HYDROXY (VIT D DEFICIENCY, FRACTURES): VITD: 33.89 ng/mL (ref 30.00–100.00)

## 2023-09-01 LAB — TSH: TSH: 3.13 u[IU]/mL (ref 0.35–5.50)

## 2023-09-01 MED ORDER — PRAVASTATIN SODIUM 10 MG PO TABS: 10.0000 mg | ORAL_TABLET | Freq: Every day | ORAL | 3 refills | Status: AC

## 2023-09-01 MED ORDER — LOSARTAN POTASSIUM 50 MG PO TABS: 50.0000 mg | ORAL_TABLET | Freq: Every day | ORAL | 1 refills | Status: DC

## 2023-09-01 MED ORDER — DICLOFENAC SODIUM 75 MG PO TBEC: 75.0000 mg | DELAYED_RELEASE_TABLET | Freq: Two times a day (BID) | ORAL | 1 refills | Status: DC

## 2023-09-01 MED ORDER — PREGABALIN 50 MG PO CAPS: ORAL_CAPSULE | ORAL | 5 refills | Status: DC

## 2023-09-01 NOTE — Patient Instructions (Signed)
 Return in about 24 weeks (around 02/16/2024).        Great to see you today.  I have refilled the medication(s) we provide.   If labs were collected or images ordered, we will inform you of  results once we have received them and reviewed. We will contact you either by echart message, or telephone call.  Please give ample time to the testing facility, and our office to run,  receive and review results. Please do not call inquiring of results, even if you can see them in your chart. We will contact you as soon as we are able. If it has been over 1 week since the test was completed, and you have not yet heard from [place], then please call us.    - echart message- for normal results that have been seen by the patient already.   - telephone call: abnormal results or if patient has not viewed results in their echart.  If a referral to a specialist was entered for you, please call us in 2 weeks if you have not heard from the specialist office to schedule.

## 2023-09-01 NOTE — Progress Notes (Signed)
 Patient ID: Sarah Fisher, female  DOB: 11-16-1960, 63 y.o.   MRN: [id number] Patient Care Team    Relationship Specialty Notifications Start End  Natalia Leatherwood, DO PCP - General Family Medicine  04/14/16   Napoleon Form, MD Consulting Physician Gastroenterology  06/10/17   Delano Metz, MD Referring Physician Pain Medicine  06/10/17   Trudie Buckler, MD Referring Physician Orthopedic Surgery  09/04/20   Ola Spurr., MD  Obstetrics and Gynecology  10/03/21     Chief Complaint  Patient presents with   Hypertension   Hyperlipidemia    Pt is fasting.     Subjective: Sarah Fisher is a 63 y.o.  Female  present for Chronic Conditions/illness Management   All past medical history, surgical history, allergies, family history, immunizations, medications and social history were updated in the electronic medical record today. All recent labs, ED visits and hospitalizations within the last year were reviewed.  Hypertension/hyperlipidemia/overweight: Patient reports compliance with  losartan 50 mg daily and pravastatin.   Patient denies chest pain, shortness of breath, dizziness or lower extremity edema.  Pt is  prescribed statin. Diet: low sodium- eating healthy- watching her weight Exercise:walking about a mile a day. RF: HLD, HTN, overweight   Chronic cervical radiculopathy (Bilateral) (L>R)/Numbness of upper extremity (Bilateral) (L>R)/Neurogenic pain/Cervical central spinal stenosis C5-6 (Right)/Cervical DDD (degenerative disc disease) (C5-6)/Cervicalgia/Chronic pain syndrome Patient has been prescribed Lyrica 50 mg in the day 100 mg nightly for her chronic pain/cervical radiculopathy and it is working well for her pain This was managed by her chronic pain clinic recently she desired we take this over for her.      11/14/2022    3:46 PM 09/03/2022   11:23 AM 10/03/2021   10:06 AM 09/04/2020    2:59 PM 06/15/2018    9:38 AM  Depression screen PHQ 2/9  Decreased  Interest 0 0 0 0 0  Down, Depressed, Hopeless 0 0 0 0 0  PHQ - 2 Score 0 0 0 0 0       No data to display           Immunization History  Administered Date(s) Administered   Influenza Inj Mdck Quad Pf 04/15/2018   Influenza, Quadrivalent, Recombinant, Inj, Pf 05/15/2020   Influenza, Seasonal, Injecte, Preservative Fre 03/17/2023   Influenza,inj,Quad PF,6+ Mos 04/14/2016, 06/03/2017, 04/13/2019, 03/28/2021, 06/19/2022   Influenza-Unspecified 06/07/2020   MMR 05/18/2018   PFIZER(Purple Top)SARS-COV-2 Vaccination 09/10/2019, 10/11/2019, 06/07/2020   Tdap 12/06/2014   Zoster Recombinant(Shingrix) 09/04/2020, 11/12/2020    Past Medical History:  Diagnosis Date   Anemia    Arthritis    Cervical spine degeneration 2010   C5-6   History of carpal tunnel syndrome    Hyperlipidemia    Hypertension    Immune to varicella 11/12/2020   Sacroiliac joint pain 2010   right   Scoliosis    Skin cancer of eyelid 12/2021   Mohs   No Known Allergies Past Surgical History:  Procedure Laterality Date   BREAST SURGERY  1986   reduction   cervical facet block  2010   HAND SURGERY Bilateral 2000 and 2016   benign tumor removal    I & D EXTREMITY Left 08/10/2017   Procedure: DEBRIDEMENT DISTAL INTERPHALANGEAL JOINT;  Surgeon: Betha Loa, MD;  Location: [facility];  Service: Orthopedics;  Laterality: Left;   left finger surgery Left 08/2017   MASS EXCISION Left 08/10/2017   Procedure: LEFT LONG  FINGER EXCISION MASS;  Surgeon: Betha Loa, MD;  Location: [facility];  Service: Orthopedics;  Laterality: Left;   MOHS SURGERY Left 12/2021   eyelid and reconstruction   WISDOM TOOTH EXTRACTION  1979   Family History  Problem Relation Age of Onset   Hypertension Mother    Arthritis Mother    Hypertension Father    Liver cancer Father    Hypertension Sister    Arthritis Maternal Aunt    Hypertension Maternal Grandmother    Kidney cancer Maternal  Grandmother    Alzheimer's disease Maternal Grandfather    Colon cancer Maternal Grandfather 9   Diabetes Maternal Grandfather    Alzheimer's disease Paternal Grandmother    Hypertension Brother    Diabetes Brother    Esophageal cancer Neg Hx    Rectal cancer Neg Hx    Stomach cancer Neg Hx    Social History   Social History Narrative   Divorced. 3 children, one lives with [name]).   She is from [place] moved in [place].    Housewife. B.A. Degree.    Drinks caffeine, uses herbal remedies. Takes a daily vitamin.    Wears her seatbelt, bicycle helmet.Smoke detector in the home.    Exercises routinely.    Feels safe in her relationships.     Allergies as of 09/01/2023   No Known Allergies      Medication List        Accurate as of September 01, 2023 12:28 PM. If you have any questions, ask your nurse or doctor.          STOP taking these medications    traMADol 50 MG tablet Commonly known as: ULTRAM Stopped by: Felix Pacini       TAKE these medications    clobetasol cream 0.05 % Commonly known as: TEMOVATE Apply 1 Application topically 2 (two) times daily.   COLLAGEN PO Take by mouth.   diclofenac 75 MG EC tablet Commonly known as: VOLTAREN Take 1 tablet (75 mg total) by mouth 2 (two) times daily.   estradiol 0.1 MG/GM vaginal cream Commonly known as: ESTRACE Place 0.5 g vaginally 2 (two) times a week.   IRON-C PO Take by mouth.   losartan 50 MG tablet Commonly known as: COZAAR Take 1 tablet (50 mg total) by mouth daily.   Multi-Vitamin Gummies Chew Chew 1 Units by mouth daily.   OSTEO BI-FLEX ONE PER DAY PO Take by mouth.   pravastatin 10 MG tablet Commonly known as: PRAVACHOL Take 1 tablet (10 mg total) by mouth daily.   pregabalin 50 MG capsule Commonly known as: Lyrica 50 mg during day, 100 mg qhs   tretinoin 0.025 % cream Commonly known as: RETIN-A Apply topically at bedtime.   triamcinolone ointment 0.5 % Commonly known  as: KENALOG Apply 1 application topically 2 (two) times daily.   VITAMIN D3 PO Take by mouth.        All past medical history, surgical history, allergies, family history, immunizations andmedications were updated in the EMR today and reviewed under the history and medication portions of their EMR.     No results found for this or any previous visit (from the past 2160 hours).  ROS 14 pt review of systems performed and negative (unless mentioned in an HPI)  Objective: BP 118/72   Pulse 68   Temp 98 F (36.7 C)   Wt 144 lb 3.2 oz (65.4 kg)   LMP  (LMP Unknown)  SpO2 97%   BMI 28.16 kg/m  Physical Exam Vitals and nursing note reviewed.  Constitutional:      General: She is not in acute distress.    Appearance: Normal appearance. She is not ill-appearing, toxic-appearing or diaphoretic.  HENT:     Head: Normocephalic and atraumatic.  Eyes:     General: No scleral icterus.       Right eye: No discharge.        Left eye: No discharge.     Extraocular Movements: Extraocular movements intact.     Conjunctiva/sclera: Conjunctivae normal.     Pupils: Pupils are equal, round, and reactive to light.  Cardiovascular:     Rate and Rhythm: Normal rate and regular rhythm.  Pulmonary:     Effort: Pulmonary effort is normal. No respiratory distress.     Breath sounds: Normal breath sounds. No wheezing, rhonchi or rales.  Musculoskeletal:     Cervical back: Neck supple. No tenderness.     Right lower leg: No edema.     Left lower leg: No edema.  Lymphadenopathy:     Cervical: No cervical adenopathy.  Skin:    General: Skin is warm and dry.     Coloration: Skin is not jaundiced or pale.     Findings: No erythema or rash.  Neurological:     Mental Status: She is alert and oriented to person, place, and time. Mental status is at baseline.     Motor: No weakness.     Gait: Gait normal.  Psychiatric:        Mood and Affect: Mood normal.        Behavior: Behavior normal.         Thought Content: Thought content normal.        Judgment: Judgment normal.      No results found.  Assessment/plan: SHARDAY MICHL is a 63 y.o. female present for Chronic Conditions/illness Management Essential hypertension/HLD/overweight Stable Continue losartan 50 mg QD.  - low salt diet, exercise.  Continue pravastatin 10 mg daily Labs: cbc, cmp, tsh and lipids collected today - f/u 5.5 mos*   Vit d def:  - pt taking 2000u daily of vit d, - vit d- collected   Low ferritin Continue Iron supplement QOD - Iron, TIBC and Ferritin Panel>UTD  Chronic cervical radiculopathy (Bilateral) (L>R)/Numbness of upper extremity (Bilateral) (L>R)/Neurogenic pain/Cervical central spinal stenosis C5-6 (Right)/Cervical DDD (degenerative disc disease) (C5-6)/Cervicalgia/Chronic pain syndrome Stable Continue Lyrica prescription for her.  The rest of the medications will still come from her chronic pain management clinic. [place] controlled substance database reviewed and appropriate today -continue  pregabalin (LYRICA) 50 MG capsule; 50 mg during day, 100 mg qhs  Dispense: 90 capsule; Refill: 5 Patient understands this medication is considered a controlled substance and will require a face-to-face visit for refills.   Return in about 24 weeks (around 02/16/2024).    Orders Placed This Encounter  Procedures   Comp Met (CMET)   Vitamin D (25 hydroxy)   CBC   TSH   Lipid panel   IBC + Ferritin   Meds ordered this encounter  Medications   diclofenac (VOLTAREN) 75 MG EC tablet    Sig: Take 1 tablet (75 mg total) by mouth 2 (two) times daily.    Dispense:  180 tablet    Refill:  1   losartan (COZAAR) 50 MG tablet    Sig: Take 1 tablet (50 mg total) by mouth daily.    Dispense:  90 tablet    Refill:  1   pravastatin (PRAVACHOL) 10 MG tablet    Sig: Take 1 tablet (10 mg total) by mouth daily.    Dispense:  90 tablet    Refill:  3   pregabalin (LYRICA) 50 MG capsule    Sig:  50 mg during day, 100 mg qhs    Dispense:  90 capsule    Refill:  5   Referral Orders  No referral(s) requested today       Electronically signed by: Felix Pacini, DO [facility] [place]

## 2023-09-02 ENCOUNTER — Encounter: Payer: Self-pay | Admitting: Family Medicine

## 2023-09-25 ENCOUNTER — Other Ambulatory Visit: Payer: Self-pay | Admitting: Family Medicine

## 2023-09-25 NOTE — Telephone Encounter (Signed)
 Copied from CRM [id number]. Topic: Clinical - Medication Refill >> Sep 25, 2023 12:59 PM Elizebeth Brooking wrote: Most Recent Primary Care Visit:  Provider: Felix Pacini A  Department: [facility]  Visit Type: OFFICE VISIT  Date: 09/01/2023  Medication: pregabalin (LYRICA) 50 MG capsule  Has the patient contacted their pharmacy? Yes (Agent: If no, request that the patient contact the pharmacy for the refill. If patient does not wish to contact the pharmacy document the reason why and proceed with request.) (Agent: If yes, when and what did the pharmacy advise?)  Is this the correct pharmacy for this prescription? Yes If no, delete pharmacy and type the correct one.  This is the patient's preferred pharmacy:  [id number] [place], [place] - [address] AT [facility] [address] [facility] [address] & [facility] [address] [place] [address] Phone: [phone] Fax: [phone]   Has the prescription been filled recently? No  Is the patient out of the medication? Yes  Has the patient been seen for an appointment in the last year OR does the patient have an upcoming appointment? Yes  Can we respond through MyChart? Yes  Agent: Please be advised that Rx refills may take up to 3 business days. We ask that you follow-up with your pharmacy.

## 2023-09-28 NOTE — Telephone Encounter (Signed)
 Last refill 09/01/23 (90,5), refill is currently too early.

## 2023-09-28 NOTE — Telephone Encounter (Signed)
 Please inform patient that her Lyrica was refilled appropriately during her visit 09/01/2023.  However the reason it is not being filled by her pharmacy at this time is because the pharmacy stating it is too early from her last pickup. Therefore patient will need to discuss with her pharmacist when she is able to pick up the prescription.

## 2023-11-23 ENCOUNTER — Other Ambulatory Visit: Payer: Self-pay | Admitting: Family Medicine

## 2023-11-23 NOTE — Telephone Encounter (Signed)
 Copied from CRM [id number]. Topic: Clinical - Medication Refill >> Nov 23, 2023 10:59 AM Dimple Francis wrote: Medication: losartan  (COZAAR ) 50 MG tablet ; pregabalin  (LYRICA ) 50 MG capsule ; pravastatin  (PRAVACHOL ) 10 MG tablet  Has the patient contacted their pharmacy? Yes (Agent: If no, request that the patient contact the pharmacy for the refill. If patient does not wish to contact the pharmacy document the reason why and proceed with request.) (Agent: If yes, when and what did the pharmacy advise?)  This is the patient's preferred pharmacy:  [facility] #[id number] [place],  - [address] AT [facility] OF [facility] [address] & [facility] [address] [place] [address] Phone: [phone] Fax: [phone]  Is this the correct pharmacy for this prescription? Yes If no, delete pharmacy and type the correct one.   Has the prescription been filled recently? Yes  Is the patient out of the medication? Yes  Has the patient been seen for an appointment in the last year OR does the patient have an upcoming appointment? Yes  Can we respond through MyChart? Yes  Agent: Please be advised that Rx refills may take up to 3 business days. We ask that you follow-up with your pharmacy.

## 2023-12-08 DIAGNOSIS — Z1231 Encounter for screening mammogram for malignant neoplasm of breast: Secondary | ICD-10-CM | POA: Diagnosis not present

## 2023-12-08 LAB — HM MAMMOGRAPHY

## 2023-12-10 ENCOUNTER — Telehealth: Payer: Self-pay

## 2023-12-10 NOTE — Telephone Encounter (Signed)
 Please inform patient Her mammogram is normal

## 2023-12-10 NOTE — Telephone Encounter (Signed)
 Received Mammogram results from [facility] on 12/10/23, HM updated.   Will place on PCP desk for review.

## 2023-12-10 NOTE — Telephone Encounter (Signed)
 Pt aware and verbalized understanding.

## 2023-12-16 ENCOUNTER — Encounter: Payer: Self-pay | Admitting: Family Medicine

## 2023-12-16 ENCOUNTER — Ambulatory Visit: Admitting: Family Medicine

## 2023-12-16 VITALS — BP 120/80 | HR 61 | Temp 98.0°F | Wt 145.0 lb

## 2023-12-16 DIAGNOSIS — I7090 Unspecified atherosclerosis: Secondary | ICD-10-CM | POA: Diagnosis not present

## 2023-12-16 DIAGNOSIS — E782 Mixed hyperlipidemia: Secondary | ICD-10-CM | POA: Diagnosis not present

## 2023-12-16 NOTE — Patient Instructions (Signed)

## 2023-12-16 NOTE — Progress Notes (Signed)
 Sarah Fisher , 16-Jan-1961, 63 y.o., adult MRN: [id number] Patient Care Team    Relationship Specialty Notifications Start End  Mariel Shope, DO PCP - General Family Medicine  04/14/16   Sergio Dandy, MD Consulting Physician Gastroenterology  06/10/17   Renaldo Caroli, MD Referring Physician Pain Medicine  06/10/17   Sandee Crook, MD Referring Physician Orthopedic Surgery  09/04/20   Catarina Cline., MD  Obstetrics and Gynecology  10/03/21     Chief Complaint  Patient presents with   Discuss Imaging Results    Pt here to discuss her recent BAC results.      Subjective: Sarah Fisher is a 63 y.o. Pt presents for an OV to discuss arterial calcification appreciated on left breast during mammogram and breast arterial calcification study. She has a h/o hyperlipidemia, compliant with pravastatin . Cholesterol ratio 3.  No fhx of heart disease.      11/14/2022    3:46 PM 09/03/2022   11:23 AM 10/03/2021   10:06 AM 09/04/2020    2:59 PM 06/15/2018    9:38 AM  Depression screen PHQ 2/9  Decreased Interest 0 0 0 0 0  Down, Depressed, Hopeless 0 0 0 0 0  PHQ - 2 Score 0 0 0 0 0    No Known Allergies Social History   Social History Narrative   Divorced. 3 children, one lives with [name]).   She is from [place] moved in [place].    Housewife. B.A. Degree.    Drinks caffeine, uses herbal remedies. Takes a daily vitamin.    Wears her seatbelt, bicycle helmet.Smoke detector in the home.    Exercises routinely.    Feels safe in her relationships.    Past Medical History:  Diagnosis Date   Anemia    Arthritis    Cervical spine degeneration 2010   C5-6   History of carpal tunnel syndrome    Hyperlipidemia    Hypertension    Immune to varicella 11/12/2020   Sacroiliac joint pain 2010   right   Scoliosis    Skin cancer of eyelid 12/2021   Mohs   Past Surgical History:  Procedure Laterality Date   BREAST SURGERY  1986   reduction   cervical facet  block  2010   HAND SURGERY Bilateral 2000 and 2016   benign tumor removal    I & D EXTREMITY Left 08/10/2017   Procedure: DEBRIDEMENT DISTAL INTERPHALANGEAL JOINT;  Surgeon: Brunilda Capra, MD;  Location: [facility];  Service: Orthopedics;  Laterality: Left;   left finger surgery Left 08/2017   MASS EXCISION Left 08/10/2017   Procedure: LEFT LONG FINGER EXCISION MASS;  Surgeon: Brunilda Capra, MD;  Location: [facility];  Service: Orthopedics;  Laterality: Left;   MOHS SURGERY Left 12/2021   eyelid and reconstruction   WISDOM TOOTH EXTRACTION  1979   Family History  Problem Relation Age of Onset   Hypertension Mother    Arthritis Mother    Hypertension Father    Liver cancer Father    Hypertension Sister    Arthritis Maternal Aunt    Hypertension Maternal Grandmother    Kidney cancer Maternal Grandmother    Alzheimer's disease Maternal Grandfather    Colon cancer Maternal Grandfather 32   Diabetes Maternal Grandfather    Alzheimer's disease Paternal Grandmother    Hypertension Brother    Diabetes Brother    Esophageal cancer Neg Hx  Rectal cancer Neg Hx    Stomach cancer Neg Hx    Allergies as of 12/16/2023   No Known Allergies      Medication List        Accurate as of December 16, 2023  2:28 PM. If you have any questions, ask your nurse or doctor.          clobetasol cream 0.05 % Commonly known as: TEMOVATE Apply 1 Application topically 2 (two) times daily.   COLLAGEN PO Take by mouth.   diclofenac  75 MG EC tablet Commonly known as: VOLTAREN  Take 1 tablet (75 mg total) by mouth 2 (two) times daily.   estradiol 0.1 MG/GM vaginal cream Commonly known as: ESTRACE Place 0.5 g vaginally 2 (two) times a week.   IRON-C PO Take by mouth.   losartan  50 MG tablet Commonly known as: COZAAR  Take 1 tablet (50 mg total) by mouth daily.   Multi-Vitamin Gummies Chew Chew 1 Units by mouth daily.   OSTEO BI-FLEX ONE PER DAY PO Take by  mouth.   pravastatin  10 MG tablet Commonly known as: PRAVACHOL  Take 1 tablet (10 mg total) by mouth daily.   pregabalin  50 MG capsule Commonly known as: Lyrica  50 mg during day, 100 mg qhs   tretinoin 0.025 % cream Commonly known as: RETIN-A Apply topically at bedtime.   triamcinolone  ointment 0.5 % Commonly known as: KENALOG  Apply 1 application topically 2 (two) times daily.   VITAMIN D3 PO Take by mouth.        All past medical history, surgical history, allergies, family history, immunizations andmedications were updated in the EMR today and reviewed under the history and medication portions of their EMR.     ROS Negative, with the exception of above mentioned in HPI   Objective:  BP 120/80   Pulse 61   Temp 98 F (36.7 C)   Wt 145 lb (65.8 kg)   LMP  (LMP Unknown)   SpO2 98%   BMI 28.32 kg/m  Body mass index is 28.32 kg/m. Physical Exam Vitals and nursing note reviewed. Exam conducted with a chaperone present.  Constitutional:      General: She is not in acute distress.    Appearance: Normal appearance. She is not ill-appearing, toxic-appearing or diaphoretic.  HENT:     Head: Normocephalic and atraumatic.  Eyes:     General: No scleral icterus.       Right eye: No discharge.        Left eye: No discharge.     Extraocular Movements: Extraocular movements intact.     Pupils: Pupils are equal, round, and reactive to light.  Cardiovascular:     Rate and Rhythm: Normal rate and regular rhythm.  Pulmonary:     Effort: Pulmonary effort is normal. No respiratory distress.     Breath sounds: Normal breath sounds. No wheezing, rhonchi or rales.  Musculoskeletal:     Right lower leg: No edema.     Left lower leg: No edema.  Skin:    General: Skin is warm.     Findings: No rash.  Neurological:     Mental Status: She is alert and oriented to person, place, and time. Mental status is at baseline.  Psychiatric:        Mood and Affect: Mood normal.         Behavior: Behavior normal.        Thought Content: Thought content normal.        Judgment:  Judgment normal.      No results found. No results found. No results found for this or any previous visit (from the past 24 hours).  Assessment/Plan: Sarah Fisher is a 63 y.o. adult present for OV for  Mixed hyperlipidemia (Primary)/Arterial calcification, breast (left) Discussed findings of unilateral arterial breast calcification.  Discussed coronary ca score/cardiac CT  with her today. She would like to move forward with screening.  - CT CARDIAC SCORING (SELF PAY ONLY); Future   Reviewed expectations re: course of current medical issues. Discussed self-management of symptoms. Outlined signs and symptoms indicating need for more acute intervention. Patient verbalized understanding and all questions were answered. Patient received an After-Visit Summary.    Orders Placed This Encounter  Procedures   CT CARDIAC SCORING (SELF PAY ONLY)   No orders of the defined types were placed in this encounter.  Referral Orders  No referral(s) requested today     Note is dictated utilizing voice recognition software. Although note has been proof read prior to signing, occasional typographical errors still can be missed. If any questions arise, please do not hesitate to call for verification.   electronically signed by:  Napolean Backbone, DO  [facility] [place]

## 2024-01-07 ENCOUNTER — Ambulatory Visit (HOSPITAL_BASED_OUTPATIENT_CLINIC_OR_DEPARTMENT_OTHER)
Admission: RE | Admit: 2024-01-07 | Discharge: 2024-01-07 | Disposition: A | Discharge status: Home / Self Care | Payer: Self-pay | Source: Ambulatory Visit | Attending: Family Medicine | Admitting: Family Medicine

## 2024-01-07 DIAGNOSIS — E782 Mixed hyperlipidemia: Secondary | ICD-10-CM | POA: Insufficient documentation

## 2024-01-18 ENCOUNTER — Ambulatory Visit: Payer: Self-pay | Admitting: Family Medicine

## 2024-02-16 ENCOUNTER — Ambulatory Visit: Payer: BC Managed Care – PPO | Admitting: Family Medicine

## 2024-02-16 VITALS — BP 122/82 | HR 62 | Temp 98.1°F | Wt 146.4 lb

## 2024-02-16 DIAGNOSIS — E782 Mixed hyperlipidemia: Secondary | ICD-10-CM

## 2024-02-16 DIAGNOSIS — M542 Cervicalgia: Secondary | ICD-10-CM

## 2024-02-16 DIAGNOSIS — M5412 Radiculopathy, cervical region: Secondary | ICD-10-CM

## 2024-02-16 DIAGNOSIS — E559 Vitamin D deficiency, unspecified: Secondary | ICD-10-CM | POA: Diagnosis not present

## 2024-02-16 DIAGNOSIS — R202 Paresthesia of skin: Secondary | ICD-10-CM

## 2024-02-16 DIAGNOSIS — I10 Essential (primary) hypertension: Secondary | ICD-10-CM

## 2024-02-16 DIAGNOSIS — E663 Overweight: Secondary | ICD-10-CM

## 2024-02-16 DIAGNOSIS — M792 Neuralgia and neuritis, unspecified: Secondary | ICD-10-CM

## 2024-02-16 DIAGNOSIS — M79602 Pain in left arm: Secondary | ICD-10-CM

## 2024-02-16 DIAGNOSIS — R200 Anesthesia of skin: Secondary | ICD-10-CM

## 2024-02-16 DIAGNOSIS — M25561 Pain in right knee: Secondary | ICD-10-CM

## 2024-02-16 DIAGNOSIS — M4802 Spinal stenosis, cervical region: Secondary | ICD-10-CM

## 2024-02-16 DIAGNOSIS — G894 Chronic pain syndrome: Secondary | ICD-10-CM

## 2024-02-16 DIAGNOSIS — R790 Abnormal level of blood mineral: Secondary | ICD-10-CM

## 2024-02-16 DIAGNOSIS — M79601 Pain in right arm: Secondary | ICD-10-CM

## 2024-02-16 DIAGNOSIS — M4692 Unspecified inflammatory spondylopathy, cervical region: Secondary | ICD-10-CM

## 2024-02-16 DIAGNOSIS — G8929 Other chronic pain: Secondary | ICD-10-CM | POA: Insufficient documentation

## 2024-02-16 MED ORDER — PREGABALIN 50 MG PO CAPS: ORAL_CAPSULE | ORAL | 5 refills | Status: AC

## 2024-02-16 MED ORDER — LOSARTAN POTASSIUM 50 MG PO TABS: 50.0000 mg | ORAL_TABLET | Freq: Every day | ORAL | 1 refills | Status: AC

## 2024-02-16 MED ORDER — DICLOFENAC SODIUM 75 MG PO TBEC: 75.0000 mg | DELAYED_RELEASE_TABLET | Freq: Two times a day (BID) | ORAL | 1 refills | Status: AC

## 2024-02-16 NOTE — Patient Instructions (Addendum)
 Return in about 24 weeks (around 08/02/2024) for cpe (20 min), Routine chronic condition follow-up.        Great to see you today.  I have refilled the medication(s) we provide.   If labs were collected or images ordered, we will inform you of  results once we have received them and reviewed. We will contact you either by echart message, or telephone call.  Please give ample time to the testing facility, and our office to run,  receive and review results. Please do not call inquiring of results, even if you can see them in your chart. We will contact you as soon as we are able. If it has been over 1 week since the test was completed, and you have not yet heard from us , then please call us .    - echart message- for normal results that have been seen by the patient already.   - telephone call: abnormal results or if patient has not viewed results in their echart.  If a referral to a specialist was entered for you, please call us  in 2 weeks if you have not heard from the specialist office to schedule.   If you are interested in weight loss counseling please make appt to discuss and bring with you 2 weeks of a food diary/log on notebook paper.  Food log is mandatory on first appt to proceed with counseling.  Weight loss counseling encompasses diet, exercise and can include  medications when appropriate and affordable.  There are routine appts for check-ins and weights to track progress and keep you on track.  Routine check-ins (in person) are also mandatory to continue with prescription refills. Check-in timeline  can range from 4 weeks to 12 weeks, depending on physician's recommendations and which step you are in of your weight loss journey.  Your BMI today is Body mass index is 28.59 kg/m.  Please check with your insurance prior to appt and ask them if they cover weight loss medications for your BMI? And if so, which medications. They may tell you some of the diabetes meds that are used for  weight loss also,  are on your formulary- but this does not mean they are covered for weight loss only.  Even if they tell with a prior auth it is covered- make sure they check to see if you personally meet criteria with your BMI.

## 2024-02-16 NOTE — Progress Notes (Signed)
 Patient ID: Sarah Fisher, female  DOB: [birth year], 63 y.o.   MRN: [id number] Patient Care Team    Relationship Specialty Notifications Start End  Catherine Charlies LABOR, DO PCP - General Family Medicine  04/14/16   Shila Gustav GAILS, MD Consulting Physician Gastroenterology  06/10/17   Como Glisson, MD Referring Physician Pain Medicine  06/10/17   Jenna Reena SAILOR, MD Referring Physician Orthopedic Surgery  09/04/20   Teresa Manuelita RAMAN., MD  Obstetrics and Gynecology  10/03/21     Chief Complaint  Patient presents with   Hypertension    Subjective: Sarah Fisher is a 63 y.o.  Female  present for Chronic Conditions/illness Management   All past medical history, surgical history, allergies, family history, immunizations, medications and social history were updated in the electronic medical record today. All recent labs, ED visits and hospitalizations within the last year were reviewed.  Hypertension/hyperlipidemia/overweight: Patient reports compliance with  losartan  50 mg daily and pravastatin .   Patient denies chest pain, shortness of breath, dizziness or lower extremity edema.   Pt is  prescribed statin. Diet: low sodium- eating healthy- watching her weight Exercise:walking about a mile a day. RF: HLD, HTN, overweight arterial calcification appreciated on left breast during mammogram and breast arterial calcification study. She has a h/o hyperlipidemia, compliant with pravastatin . Cholesterol ratio 3.  No fhx of heart disease.   Chronic cervical radiculopathy (Bilateral) (L>R)/Numbness of upper extremity (Bilateral) (L>R)/Neurogenic pain/Cervical central spinal stenosis C5-6 (Right)/Cervical DDD (degenerative disc disease) (C5-6)/Cervicalgia/Chronic pain syndrome Patient has been prescribed Lyrica  50 mg in the day 100 mg nightly for her chronic pain/cervical radiculopathy and it is working well for her pain She is compliant with medication and feels it is working well for  her.  Knee pain: Patient reports she has noticed increase in her knee pain.  It has bothered her over the last year but over the last 4 months, more persistent.  She overall experiences a sharp pain in the lateral aspect of her knee which can cause her to have gait instability.    12/16/2023   11:33 AM 11/14/2022    3:46 PM 09/03/2022   11:23 AM 10/03/2021   10:06 AM 09/04/2020    2:59 PM  Depression screen PHQ 2/9  Decreased Interest 0 0 0 0 0  Down, Depressed, Hopeless 0 0 0 0 0  PHQ - 2 Score 0 0 0 0 0       No data to display           Immunization History  Administered Date(s) Administered   Influenza Inj Mdck Quad Pf 04/15/2018   Influenza, Quadrivalent, Recombinant, Inj, Pf 05/15/2020   Influenza, Seasonal, Injecte, Preservative Fre 03/17/2023   Influenza,inj,Quad PF,6+ Mos 04/14/2016, 06/03/2017, 04/13/2019, 03/28/2021, 06/19/2022   Influenza-Unspecified 06/07/2020   MMR 05/18/2018   PFIZER(Purple Top)SARS-COV-2 Vaccination 09/10/2019, 10/11/2019, 06/07/2020   Tdap 12/06/2014   Zoster Recombinant(Shingrix) 09/04/2020, 11/12/2020    Past Medical History:  Diagnosis Date   Anemia    Arthritis    Cervical spine degeneration 2010   C5-6   History of carpal tunnel syndrome    Hyperlipidemia    Hypertension    Immune to varicella 11/12/2020   Sacroiliac joint pain 2010   right   Scoliosis    Skin cancer of eyelid 12/2021   Mohs   No Known Allergies Past Surgical History:  Procedure Laterality Date   BREAST SURGERY  1986   reduction   cervical facet block  2010   HAND SURGERY Bilateral 2000 and 2016   benign tumor removal    I & D EXTREMITY Left 08/10/2017   Procedure: DEBRIDEMENT DISTAL INTERPHALANGEAL JOINT;  Surgeon: Murrell Drivers, MD;  Location: [facility];  Service: Orthopedics;  Laterality: Left;   left finger surgery Left 08/2017   MASS EXCISION Left 08/10/2017   Procedure: LEFT LONG FINGER EXCISION MASS;  Surgeon: Murrell Drivers, MD;   Location: [facility];  Service: Orthopedics;  Laterality: Left;   MOHS SURGERY Left 12/2021   eyelid and reconstruction   WISDOM TOOTH EXTRACTION  1979   Family History  Problem Relation Age of Onset   Hypertension Mother    Arthritis Mother    Hypertension Father    Liver cancer Father    Hypertension Sister    Arthritis Maternal Aunt    Hypertension Maternal Grandmother    Kidney cancer Maternal Grandmother    Alzheimer's disease Maternal Grandfather    Colon cancer Maternal Grandfather 32   Diabetes Maternal Grandfather    Alzheimer's disease Paternal Grandmother    Hypertension Brother    Diabetes Brother    Esophageal cancer Neg Hx    Rectal cancer Neg Hx    Stomach cancer Neg Hx    Social History   Social History Narrative   Divorced. 3 children, one lives with [name]).   She is from [place] moved in [place].    Housewife. B.A. Degree.    Drinks caffeine, uses herbal remedies. Takes a daily vitamin.    Wears her seatbelt, bicycle helmet.Smoke detector in the home.    Exercises routinely.    Feels safe in her relationships.     Allergies as of 02/16/2024   No Known Allergies      Medication List        Accurate as of February 16, 2024  1:08 PM. If you have any questions, ask your nurse or doctor.          clobetasol cream 0.05 % Commonly known as: TEMOVATE Apply 1 Application topically 2 (two) times daily.   COLLAGEN PO Take by mouth.   diclofenac  75 MG EC tablet Commonly known as: VOLTAREN  Take 1 tablet (75 mg total) by mouth 2 (two) times daily.   estradiol 0.1 MG/GM vaginal cream Commonly known as: ESTRACE Place 0.5 g vaginally 2 (two) times a week.   IRON-C PO Take by mouth.   losartan  50 MG tablet Commonly known as: COZAAR  Take 1 tablet (50 mg total) by mouth daily.   Multi-Vitamin Gummies Chew Chew 1 Units by mouth daily.   OSTEO BI-FLEX ONE PER DAY PO Take by mouth.   pravastatin  10 MG tablet Commonly known  as: PRAVACHOL  Take 1 tablet (10 mg total) by mouth daily.   pregabalin  50 MG capsule Commonly known as: Lyrica  50 mg during day, 100 mg qhs   tretinoin 0.025 % cream Commonly known as: RETIN-A Apply topically at bedtime.   triamcinolone  ointment 0.5 % Commonly known as: KENALOG  Apply 1 application topically 2 (two) times daily.   VITAMIN D3 PO Take by mouth.        All past medical history, surgical history, allergies, family history, immunizations andmedications were updated in the EMR today and reviewed under the history and medication portions of their EMR.     Recent Results (from the past 2160 hours)  HM MAMMOGRAPHY     Status: None   Collection Time: 12/08/23 12:00 AM  Result Value  Ref Range   HM Mammogram 0-4 Bi-Rad 0-4 Bi-Rad, Self Reported Normal    ROS 14 pt review of systems performed and negative (unless mentioned in an HPI)  Objective: BP 122/82   Pulse 62   Temp 98.1 F (36.7 C)   Wt 146 lb 6.4 oz (66.4 kg)   LMP  (LMP Unknown)   SpO2 96%   BMI 28.59 kg/m  Physical Exam Vitals and nursing note reviewed.  Constitutional:      General: She is not in acute distress.    Appearance: Normal appearance. She is not ill-appearing, toxic-appearing or diaphoretic.  HENT:     Head: Normocephalic and atraumatic.  Eyes:     General: No scleral icterus.       Right eye: No discharge.        Left eye: No discharge.     Extraocular Movements: Extraocular movements intact.     Conjunctiva/sclera: Conjunctivae normal.     Pupils: Pupils are equal, round, and reactive to light.  Cardiovascular:     Rate and Rhythm: Normal rate and regular rhythm.  Pulmonary:     Effort: Pulmonary effort is normal. No respiratory distress.     Breath sounds: Normal breath sounds. No wheezing, rhonchi or rales.  Musculoskeletal:     Cervical back: Neck supple. No tenderness.     Right lower leg: No edema.     Left lower leg: No edema.  Lymphadenopathy:     Cervical: No  cervical adenopathy.  Skin:    General: Skin is warm and dry.     Coloration: Skin is not jaundiced or pale.     Findings: No erythema or rash.  Neurological:     Mental Status: She is alert and oriented to person, place, and time. Mental status is at baseline.     Motor: No weakness.     Gait: Gait normal.  Psychiatric:        Mood and Affect: Mood normal.        Behavior: Behavior normal.        Thought Content: Thought content normal.        Judgment: Judgment normal.      No results found.  Assessment/plan: LANIE SCHELLING is a 63 y.o. female present for Chronic Conditions/illness Management Essential hypertension/HLD/overweight Stable Continue losartan  50 mg QD.  - low salt diet, exercise.  Continue pravastatin  10 mg daily Labs: UTD, due next visit Coronary calcium score: ZERO (01/07/2024)  Vit d def:  - pt taking 2000u daily of vit d, - vit d-UTD due next visit   Low ferritin Continue iron supplement QOD - Iron, TIBC and Ferritin Panel>UTD  Chronic cervical radiculopathy (Bilateral) (L>R)/Numbness of upper extremity (Bilateral) (L>R)/Neurogenic pain/Cervical central spinal stenosis C5-6 (Right)/Cervical DDD (degenerative disc disease) (C5-6)/Cervicalgia/Chronic pain syndrome Stable Continue Lyrica  prescription for her.  The rest of the medications will still come from her chronic pain management clinic. You diclofenac  75 mg twice daily [place]  controlled substance database reviewed and appropriate today Patient understands this medication is considered a controlled substance and will require a face-to-face visit for refills.  Knee pain: We discussed orthopedic versus sports med referral and agree to refer to sports med for now.  Exam is especially normal today.  Pain seems to originate posterior laterally.  Return in about 24 weeks (around 08/02/2024) for cpe (20 min), Routine chronic condition follow-up.    Orders Placed This Encounter  Procedures    Ambulatory referral to Sports Medicine  Meds ordered this encounter  Medications   diclofenac  (VOLTAREN ) 75 MG EC tablet    Sig: Take 1 tablet (75 mg total) by mouth 2 (two) times daily.    Dispense:  180 tablet    Refill:  1   losartan  (COZAAR ) 50 MG tablet    Sig: Take 1 tablet (50 mg total) by mouth daily.    Dispense:  90 tablet    Refill:  1   pregabalin  (LYRICA ) 50 MG capsule    Sig: 50 mg during day, 100 mg qhs    Dispense:  90 capsule    Refill:  5   Referral Orders         Ambulatory referral to Sports Medicine         Electronically signed by: Charlies Bellini, DO [facility] [place]

## 2024-03-08 NOTE — Progress Notes (Deleted)
    [name] D.[name] [address] [place] [address] Phone: [phone]   Assessment and Plan:     ***    Pertinent previous records reviewed include ***   Follow Up: ***     Subjective:   I, Chestine Reeves, am serving as a Neurosurgeon for Doctor [name]  Chief Complaint: right knee pain   HPI:   03/09/2024 Patient is a 63 year old female with right knee pain. Patient states   Relevant Historical Information: ***  Additional pertinent review of systems negative.   Current Outpatient Medications:    B[place]ia-Glucosamine-Vit D (OSTEO BI-FLEX ONE PER DAY PO), Take by mouth., Disp: , Rfl:    Cholecalciferol (VITAMIN D3 PO), Take by mouth., Disp: , Rfl:    clobetasol cream (TEMOVATE) 0.05 %, Apply 1 Application topically 2 (two) times daily., Disp: , Rfl:    COLLAGEN PO, Take by mouth., Disp: , Rfl:    diclofenac  (VOLTAREN ) 75 MG EC tablet, Take 1 tablet (75 mg total) by mouth 2 (two) times daily., Disp: 180 tablet, Rfl: 1   estradiol (ESTRACE) 0.1 MG/GM vaginal cream, Place 0.5 g vaginally 2 (two) times a week., Disp: , Rfl:    Ferrous Gluconate-C-Folic Acid (IRON-C PO), Take by mouth., Disp: , Rfl:    losartan  (COZAAR ) 50 MG tablet, Take 1 tablet (50 mg total) by mouth daily., Disp: 90 tablet, Rfl: 1   Multiple Vitamins-Minerals (MULTI-VITAMIN GUMMIES) CHEW, Chew 1 Units by mouth daily., Disp: , Rfl:    pravastatin  (PRAVACHOL ) 10 MG tablet, Take 1 tablet (10 mg total) by mouth daily., Disp: 90 tablet, Rfl: 3   pregabalin  (LYRICA ) 50 MG capsule, 50 mg during day, 100 mg qhs, Disp: 90 capsule, Rfl: 5   tretinoin (RETIN-A) 0.025 % cream, Apply topically at bedtime., Disp: , Rfl:    triamcinolone  ointment (KENALOG ) 0.5 %, Apply 1 application topically 2 (two) times daily., Disp: 30 g, Rfl: 5   Objective:     There were no vitals filed for this visit.    There is no height or weight on file to calculate BMI.     Physical Exam:    ***   Electronically signed by:  Odis Mace D[name] 7:38 AM 03/08/24

## 2024-03-09 ENCOUNTER — Ambulatory Visit: Admitting: Sports Medicine

## 2024-03-21 ENCOUNTER — Other Ambulatory Visit: Payer: Self-pay | Admitting: Family Medicine

## 2024-03-21 NOTE — Addendum Note (Signed)
 Addended by: ARLOA MURIEL POUR on: 03/21/2024 03:18 PM   Modules accepted: Orders

## 2024-03-21 NOTE — Telephone Encounter (Signed)
 Copied from CRM [id number]. Topic: Clinical - Medication Refill >> Mar 21, 2024  1:22 PM [place] E wrote: Medication: pregabalin  (LYRICA ) 50 MG capsule   Has the patient contacted their pharmacy? Yes (Agent: If no, request that the patient contact the pharmacy for the refill. If patient does not wish to contact the pharmacy document the reason why and proceed with request.) (Agent: If yes, when and what did the pharmacy advise?)  This is the patient's preferred pharmacy:  [id number] [place], [place] - [address] AT [facility] [address] & [facility] [address] [place] [address] Phone: [phone] Fax: [phone]  Is this the correct pharmacy for this prescription? Yes If no, delete pharmacy and type the correct one.   Has the prescription been filled recently? Yes  Is the patient out of the medication? No, 4 days left.  Has the patient been seen for an appointment in the last year OR does the patient have an upcoming appointment? Yes  Can we respond through MyChart? Yes  Agent: Please be advised that Rx refills may take up to 3 business days. We ask that you follow-up with your pharmacy.

## 2024-03-24 NOTE — Progress Notes (Unsigned)
 [name] D[name] [address] [place] [address] Phone: [phone]   Assessment and Plan:     1. Chronic pain of right knee (Primary) 2. Acute pain of left knee 3. Osteoarthritis of knee (Bilateral) (R>L) -Chronic with exacerbation, initial sports medicine visit - Acute left knee pain and chronic right knee pain with exacerbation.  Most consistent with flare of mild osteoarthritis, right greater than   left. - Start prednisone  Dosepak - Start HEP for knees - X-rays obtained in clinic.  My interpretation: No acute fracture or dislocation.  Lateral patellar spurring.,  Superior patellar spurring, right lateral knee compartment spur  15 additional minutes spent for educating Therapeutic Home Exercise Program.  This included exercises focusing on stretching, strengthening, with focus on eccentric aspects.   Long term goals include an improvement in range of motion, strength, endurance as well as avoiding reinjury. Patient's frequency would include in 1-2 times a day, 3-5 times a week for a duration of 6-12 weeks. Proper technique shown and discussed handout in great detail with ATC.  All questions were discussed and answered.    4. Neck pain 5. Chronic shoulder pain (Bilateral) (L>R) -Chronic, stable, initial visit - History of chronic neck and bilateral shoulder pain treated with daily NSAIDs, tizanidine , pregabalin  for years.  EMR states that seronegative rheumatoid arthritis.  Patient has negative ANA and negative CCP from lab work in 2018, so based on my chart review, patient does not meet criteria for rheumatoid arthritis.  More consistent with osteoarthritis - Use NSAIDs daily as needed for pain.  Recommend limiting chronic NSAIDs to 1-2 doses per week to prevent long-term side effects.  - Use Tylenol  500 to 1000 mg tablets 2-3 times a day for day-to-day pain relief  -May use tizanidine  as needed nightly for muscle spasms.  Do not  recommend nightly use - May continue pregabalin     Pertinent previous records reviewed include autoimmune lab work 2018   Follow Up: 3 weeks for reevaluation.  Could consider physical therapy versus intra-articular CSI   Subjective:   I, Lowella Kindley, am serving as a Neurosurgeon for Doctor [name]  Chief Complaint: bilat  knee pain   HPI:   03/25/2024 Patient is a 63 year old female with right knee pain. Patient states right knee intermittent pain. Hx of right knee arthrits pain was lateral and under the knee cap. No MOI. No meds for the pain. Pain radiates up the leg. Decreased ROM. Pain going up and down steps she has to go slow  Left knee pain started a weeks ago. She was walking and felt a small pop in her knee. Her next step was painful and she wasn't able to apply weight to her leg. She RICEd, and biofreeze and the pain has gone down some . She has a pain shooting up her knee.    Relevant Historical Information: Hypertension, reported seronegative rheumatoid arthritis  Additional pertinent review of systems negative.   Current Outpatient Medications:    methylPREDNISolone  (MEDROL  DOSEPAK) 4 MG TBPK tablet, Take 6 tablets on day 1.  Take 5 tablets on day 2.  Take 4 tablets on day 3.  Take 3 tablets on day 4.  Take 2 tablets on day 5.  Take 1 tablet on day 6., Disp: 21 tablet, Rfl: 0   Cholecalciferol (VITAMIN D3 PO), Take by mouth., Disp: , Rfl:    clobetasol cream (TEMOVATE) 0.05 %, Apply 1 Application topically 2 (two) times  daily., Disp: , Rfl:    COLLAGEN PO, Take by mouth., Disp: , Rfl:    diclofenac  (VOLTAREN ) 75 MG EC tablet, Take 1 tablet (75 mg total) by mouth 2 (two) times daily., Disp: 180 tablet, Rfl: 1   estradiol (ESTRACE) 0.1 MG/GM vaginal cream, Place 0.5 g vaginally 2 (two) times a week., Disp: , Rfl:    Ferrous Gluconate-C-Folic Acid (IRON-C PO), Take by mouth., Disp: , Rfl:    losartan  (COZAAR ) 50 MG tablet, Take 1 tablet (50 mg total) by mouth daily.,  Disp: 90 tablet, Rfl: 1   Multiple Vitamins-Minerals (MULTI-VITAMIN GUMMIES) CHEW, Chew 1 Units by mouth daily., Disp: , Rfl:    pravastatin  (PRAVACHOL ) 10 MG tablet, Take 1 tablet (10 mg total) by mouth daily., Disp: 90 tablet, Rfl: 3   pregabalin  (LYRICA ) 50 MG capsule, 50 mg during day, 100 mg qhs, Disp: 90 capsule, Rfl: 5   tretinoin (RETIN-A) 0.025 % cream, Apply topically at bedtime., Disp: , Rfl:    triamcinolone  ointment (KENALOG ) 0.5 %, Apply 1 application topically 2 (two) times daily., Disp: 30 g, Rfl: 5   Objective:     Vitals:   03/25/24 0842  BP: 120/82  Pulse: 69  SpO2: 99%  Weight: 147 lb (66.7 kg)  Height: 5' (1.524 m)      Body mass index is 28.71 kg/m.    Physical Exam:    General:  awake, alert oriented, no acute distress nontoxic Skin: no suspicious lesions or rashes Neuro:sensation intact and strength 5/5 with no deficits, no atrophy, normal muscle tone Psych: No signs of anxiety, depression or other mood disorder  Bilateral knee: No swelling No deformity Neg fluid wave, joint milking ROM Flex 110, Ext 0 TTP medial and lateral joint line, medial femoral condyle NTTP over the quad tendon,  , lat fem condyle, patella, plica, patella tendon, tibial tuberostiy, fibular head, posterior fossa, pes anserine bursa, gerdy's tubercle,   Neg anterior and posterior drawer Neg lachman Neg sag sign Negative varus stress Negative valgus stress Negative McMurray Positive Thessaly  Gait normal    Electronically signed by:  Odis Mace D[name] 9:09 AM 03/25/24

## 2024-03-25 ENCOUNTER — Ambulatory Visit: Admitting: Sports Medicine

## 2024-03-25 ENCOUNTER — Other Ambulatory Visit: Payer: Self-pay | Admitting: Sports Medicine

## 2024-03-25 ENCOUNTER — Ambulatory Visit

## 2024-03-25 VITALS — BP 120/82 | HR 69 | Ht 60.0 in | Wt 147.0 lb

## 2024-03-25 DIAGNOSIS — M542 Cervicalgia: Secondary | ICD-10-CM | POA: Diagnosis not present

## 2024-03-25 DIAGNOSIS — M170 Bilateral primary osteoarthritis of knee: Secondary | ICD-10-CM | POA: Diagnosis not present

## 2024-03-25 DIAGNOSIS — M25562 Pain in left knee: Secondary | ICD-10-CM

## 2024-03-25 DIAGNOSIS — M25461 Effusion, right knee: Secondary | ICD-10-CM | POA: Diagnosis not present

## 2024-03-25 DIAGNOSIS — G8929 Other chronic pain: Secondary | ICD-10-CM | POA: Diagnosis not present

## 2024-03-25 DIAGNOSIS — M25561 Pain in right knee: Secondary | ICD-10-CM

## 2024-03-25 DIAGNOSIS — M25512 Pain in left shoulder: Secondary | ICD-10-CM

## 2024-03-25 DIAGNOSIS — M25511 Pain in right shoulder: Secondary | ICD-10-CM

## 2024-03-25 MED ORDER — METHYLPREDNISOLONE 4 MG PO TBPK: ORAL_TABLET | ORAL | 0 refills | Status: DC

## 2024-03-25 NOTE — Patient Instructions (Signed)
 Knee HEP   Prednisone  dos pak   3 week follow up   Chronic pain   Tylenol  [phone] mg 2-3 times a day for pain relief   - Use NSAIDs daily as needed for pain.  Recommend limiting chronic NSAIDs to 1-2 doses per week to prevent long-term side effects.  Tizanidine  nightly as needed   Continue pregabalin 

## 2024-03-29 ENCOUNTER — Encounter: Payer: Self-pay | Admitting: Family Medicine

## 2024-03-29 ENCOUNTER — Ambulatory Visit: Admitting: Family Medicine

## 2024-03-29 VITALS — BP 122/80 | HR 68 | Temp 97.9°F | Wt 147.4 lb

## 2024-03-29 DIAGNOSIS — E782 Mixed hyperlipidemia: Secondary | ICD-10-CM

## 2024-03-29 DIAGNOSIS — I10 Essential (primary) hypertension: Secondary | ICD-10-CM

## 2024-03-29 DIAGNOSIS — E663 Overweight: Secondary | ICD-10-CM | POA: Diagnosis not present

## 2024-03-29 DIAGNOSIS — Z713 Dietary counseling and surveillance: Secondary | ICD-10-CM | POA: Diagnosis not present

## 2024-03-29 MED ORDER — NALTREXONE HCL 50 MG PO TABS: 25.0000 mg | ORAL_TABLET | Freq: Two times a day (BID) | ORAL | 3 refills | Status: AC

## 2024-03-29 MED ORDER — BUPROPION HCL ER (XL) 150 MG PO TB24: 150.0000 mg | ORAL_TABLET | Freq: Every day | ORAL | 1 refills | Status: AC

## 2024-03-29 NOTE — Progress Notes (Signed)
 Patient ID: ANALYAH Fisher, female  DOB: Jul 14, 1960, 63 y.o.   MRN: [id number] Patient Care Team    Relationship Specialty Notifications Start End  Sarah Fisher LABOR, DO PCP - General Family Medicine  04/14/16   Sarah Gustav GAILS, MD Consulting Physician Gastroenterology  06/10/17   Sarah Glisson, MD Referring Physician Pain Medicine  06/10/17   Sarah Reena SAILOR, MD Referring Physician Orthopedic Surgery  09/04/20   Sarah Manuelita RAMAN., MD  Obstetrics and Gynecology  10/03/21     Chief Complaint  Patient presents with   Weight Management Screening    Pt does not have food log with her today.     Subjective: Sarah Fisher is a 63 y.o.  Female  present for elevated BMI and counseling All past medical history, surgical history, allergies, family history, immunizations, medications and social history were updated in the electronic medical record today. All recent labs, ED visits and hospitalizations within the last year were reviewed.  Patient presents today and would like to discuss weight management. Her weight today is 147.5 pounds.  Her BMI is 28.79.  She does have a comorbid condition. She has not called her insurance company to see if they cover Zepbound or [id number]. She did not bring the mandatory 2-week food log with her She would like to discuss the GLP-1's versus other medications today. She reports she does not routinely exercise.  She does not gain much in the way of hydration with water. She does not follow a specific diet, but states she is [place] so there is a lot of carbohydrates in her ethnic foods.      12/16/2023   11:33 AM 11/14/2022    3:46 PM 09/03/2022   11:23 AM 10/03/2021   10:06 AM 09/04/2020    2:59 PM  Depression screen PHQ 2/9  Decreased Interest 0 0 0 0 0  Down, Depressed, Hopeless 0 0 0 0 0  PHQ - 2 Score 0 0 0 0 0       No data to display           Immunization History  Administered Date(s) Administered   Influenza Inj Mdck Quad Pf  04/15/2018   Influenza, Quadrivalent, Recombinant, Inj, Pf 05/15/2020   Influenza, Seasonal, Injecte, Preservative Fre 03/17/2023   Influenza,inj,Quad PF,6+ Mos 04/14/2016, 06/03/2017, 04/13/2019, 03/28/2021, 06/19/2022   Influenza-Unspecified 06/07/2020   MMR 05/18/2018   PFIZER(Purple Top)SARS-COV-2 Vaccination 09/10/2019, 10/11/2019, 06/07/2020   Tdap 12/06/2014   Zoster Recombinant(Shingrix) 09/04/2020, 11/12/2020    Past Medical History:  Diagnosis Date   Anemia    Arthritis    Cervical spine degeneration 2010   C5-6   History of carpal tunnel syndrome    Hyperlipidemia    Hypertension    Immune to varicella 11/12/2020   Sacroiliac joint pain 2010   right   Scoliosis    Skin cancer of eyelid 12/2021   Mohs   No Known Allergies Past Surgical History:  Procedure Laterality Date   BREAST SURGERY  1986   reduction   cervical facet block  2010   HAND SURGERY Bilateral 2000 and 2016   benign tumor removal    I & D EXTREMITY Left 08/10/2017   Procedure: DEBRIDEMENT DISTAL INTERPHALANGEAL JOINT;  Surgeon: Murrell Drivers, MD;  Location:  SURGERY CENTER;  Service: Orthopedics;  Laterality: Left;   left finger surgery Left 08/2017   MASS EXCISION Left 08/10/2017   Procedure: LEFT LONG FINGER EXCISION MASS;  Surgeon: Murrell,  Franky, MD;  Location: [facility];  Service: Orthopedics;  Laterality: Left;   MOHS SURGERY Left 12/2021   eyelid and reconstruction   WISDOM TOOTH EXTRACTION  1979   Family History  Problem Relation Age of Onset   Hypertension Mother    Arthritis Mother    Hypertension Father    Liver cancer Father    Hypertension Sister    Arthritis Maternal Aunt    Hypertension Maternal Grandmother    Kidney cancer Maternal Grandmother    Alzheimer's disease Maternal Grandfather    Colon cancer Maternal Grandfather 36   Diabetes Maternal Grandfather    Alzheimer's disease Paternal Grandmother    Hypertension Brother    Diabetes Brother     Esophageal cancer Neg Hx    Rectal cancer Neg Hx    Stomach cancer Neg Hx    Social History   Social History Narrative   Divorced. 3 children, one lives with her [place]).   She is from [place] moved in [place].    Housewife. B.A. Degree.    Drinks caffeine, uses herbal remedies. Takes a daily vitamin.    Wears her seatbelt, bicycle helmet.Smoke detector in the home.    Exercises routinely.    Feels safe in her relationships.     Allergies as of 03/29/2024   No Known Allergies      Medication List        Accurate as of March 29, 2024  2:28 PM. If you have any questions, ask your nurse or doctor.          buPROPion  150 MG 24 hr tablet Commonly known as: Wellbutrin  XL Take 1 tablet (150 mg total) by mouth daily. Started by: Nasean Zapf   clobetasol cream 0.05 % Commonly known as: TEMOVATE Apply 1 Application topically 2 (two) times daily.   COLLAGEN PO Take by mouth.   diclofenac  75 MG EC tablet Commonly known as: VOLTAREN  Take 1 tablet (75 mg total) by mouth 2 (two) times daily.   estradiol 0.1 MG/GM vaginal cream Commonly known as: ESTRACE Place 0.5 g vaginally 2 (two) times a week.   IRON-C PO Take by mouth.   losartan  50 MG tablet Commonly known as: COZAAR  Take 1 tablet (50 mg total) by mouth daily.   methylPREDNISolone  4 MG Tbpk tablet Commonly known as: MEDROL  DOSEPAK Take 6 tablets on day 1.  Take 5 tablets on day 2.  Take 4 tablets on day 3.  Take 3 tablets on day 4.  Take 2 tablets on day 5.  Take 1 tablet on day 6.   Multi-Vitamin Gummies Chew Chew 1 Units by mouth daily.   naltrexone  50 MG tablet Commonly known as: DEPADE Take 0.5 tablets (25 mg total) by mouth in the morning and at bedtime. Started by: Fisher Bellini   pravastatin  10 MG tablet Commonly known as: PRAVACHOL  Take 1 tablet (10 mg total) by mouth daily.   pregabalin  50 MG capsule Commonly known as: Lyrica  50 mg during day, 100 mg qhs   tretinoin 0.025 %  cream Commonly known as: RETIN-A Apply topically at bedtime.   triamcinolone  ointment 0.5 % Commonly known as: KENALOG  Apply 1 application topically 2 (two) times daily.   VITAMIN D3 PO Take by mouth.        All past medical history, surgical history, allergies, family history, immunizations andmedications were updated in the EMR today and reviewed under the history and medication portions of their EMR.     No  results found for this or any previous visit (from the past 2160 hours).   ROS 14 pt review of systems performed and negative (unless mentioned in an HPI)  Objective: BP 122/80   Pulse 68   Temp 97.9 F (36.6 C)   Wt 147 lb 6.4 oz (66.9 kg)   LMP  (LMP Unknown)   SpO2 95%   BMI 28.79 kg/m  Physical Exam Vitals and nursing note reviewed.  Constitutional:      General: She is not in acute distress.    Appearance: Normal appearance. She is not ill-appearing, toxic-appearing or diaphoretic.  HENT:     Head: Normocephalic and atraumatic.  Eyes:     General: No scleral icterus.       Right eye: No discharge.        Left eye: No discharge.     Extraocular Movements: Extraocular movements intact.     Conjunctiva/sclera: Conjunctivae normal.     Pupils: Pupils are equal, round, and reactive to light.  Cardiovascular:     Rate and Rhythm: Normal rate and regular rhythm.  Pulmonary:     Effort: Pulmonary effort is normal. No respiratory distress.     Breath sounds: Normal breath sounds. No wheezing, rhonchi or rales.  Musculoskeletal:     Right lower leg: No edema.     Left lower leg: No edema.  Skin:    General: Skin is warm and dry.     Coloration: Skin is not jaundiced or pale.     Findings: No erythema or rash.  Neurological:     Mental Status: She is alert and oriented to person, place, and time. Mental status is at baseline.     Motor: No weakness.     Gait: Gait normal.  Psychiatric:        Mood and Affect: Mood normal.        Behavior: Behavior  normal.        Thought Content: Thought content normal.        Judgment: Judgment normal.      No results found.  Assessment/plan: Sarah Fisher is a 63 y.o. female present for overweight/elevated BMI and counseling  Overweight (Primary)/hypertension/hyperlipidemia/weight loss counseling -BMI 28.7 - Weight 147.5 Patient was counseled on exercise, calorie counting, weight loss and potential medications to help with weight loss today. -Patient was provided with online resources for: Weekly net calorie calculator.  Applications for calorie counting.  Patient was advised to ensure she is taking in adequate nutrition daily by meeting calorie goals.1100-1200. -Patient was educated on dietary changes to not only lose weight but to eat healthy.  Patient was educated on glycemic index. -Patient was educated on exercise goal of 150 minutes a week (plus warm up and cool down) of cardiovascular exercise.  Patient was educated on heart rate for cardiovascular and fat burning zones. -Patient was encouraged to maintain adequate water consumption of at least 80 ounces a day, more if exercising/sweating. -Patient for low dose questions and we discussed GLP's in detail. - She elected to move forward with Wellbutrin  and Depade combination first.    Return in about 6 weeks (around 05/10/2024).    No orders of the defined types were placed in this encounter.  Meds ordered this encounter  Medications   buPROPion  (WELLBUTRIN  XL) 150 MG 24 hr tablet    Sig: Take 1 tablet (150 mg total) by mouth daily.    Dispense:  90 tablet    Refill:  1  naltrexone  (DEPADE) 50 MG tablet    Sig: Take 0.5 tablets (25 mg total) by mouth in the morning and at bedtime.    Dispense:  30 tablet    Refill:  3   Referral Orders  No referral(s) requested today        Electronically signed by: Fisher Bellini, DO [facility] [place]

## 2024-03-29 NOTE — Patient Instructions (Addendum)
 Return in about 6 weeks (around 05/10/2024).  Low glycemic diet Exercise at least 150 minutes a week.  Water- 80 ounces   Call insurance: Tzhncb and zepbound  28.79 BMI  Great to see you today.  I have refilled the medication(s) we provide.   If labs were collected or images ordered, we will inform you of  results once we have received them and reviewed. We will contact you either by echart message, or telephone call.  Please give ample time to the testing facility, and our office to run,  receive and review results. Please do not call inquiring of results, even if you can see them in your chart. We will contact you as soon as we are able. If it has been over 1 week since the test was completed, and you have not yet heard from us , then please call us .    - echart message- for normal results that have been seen by the patient already.   - telephone call: abnormal results or if patient has not viewed results in their echart.  If a referral to a specialist was entered for you, please call us  in 2 weeks if you have not heard from the specialist office to schedule.

## 2024-03-31 ENCOUNTER — Ambulatory Visit: Payer: Self-pay | Admitting: Sports Medicine

## 2024-04-13 NOTE — Progress Notes (Deleted)
    Sarah Fisher D[name] [address] [place] [address] Phone: [phone]   Assessment and Plan:     ***    Pertinent previous records reviewed include ***   Follow Up: ***     Subjective:   I, Sarah Fisher, am serving as a Neurosurgeon for Doctor [name]   Chief Complaint: bilat  knee pain    HPI:    03/25/2024 Patient is a 63 year old female with right knee pain. Patient states right knee intermittent pain. Hx of right knee arthrits pain was lateral and under the knee cap. No MOI. No meds for the pain. Pain radiates up the leg. Decreased ROM. Pain going up and down steps she has to go slow   Left knee pain started a weeks ago. She was walking and felt a small pop in her knee. Her next step was painful and she wasn't able to apply weight to her leg. She RICEd, and biofreeze and the pain has gone down some . She has a pain shooting up her knee.    04/14/2024 Patient states   Relevant Historical Information: Hypertension, reported seronegative rheumatoid arthritis    Additional pertinent review of systems negative.   Current Outpatient Medications:    buPROPion  (WELLBUTRIN  XL) 150 MG 24 hr tablet, Take 1 tablet (150 mg total) by mouth daily., Disp: 90 tablet, Rfl: 1   Cholecalciferol (VITAMIN D3 PO), Take by mouth., Disp: , Rfl:    clobetasol cream (TEMOVATE) 0.05 %, Apply 1 Application topically 2 (two) times daily., Disp: , Rfl:    COLLAGEN PO, Take by mouth., Disp: , Rfl:    diclofenac  (VOLTAREN ) 75 MG EC tablet, Take 1 tablet (75 mg total) by mouth 2 (two) times daily., Disp: 180 tablet, Rfl: 1   estradiol (ESTRACE) 0.1 MG/GM vaginal cream, Place 0.5 g vaginally 2 (two) times a week., Disp: , Rfl:    Ferrous Gluconate-C-Folic Acid (IRON-C PO), Take by mouth., Disp: , Rfl:    losartan  (COZAAR ) 50 MG tablet, Take 1 tablet (50 mg total) by mouth daily., Disp: 90 tablet, Rfl: 1   methylPREDNISolone  (MEDROL  DOSEPAK) 4 MG  TBPK tablet, Take 6 tablets on day 1.  Take 5 tablets on day 2.  Take 4 tablets on day 3.  Take 3 tablets on day 4.  Take 2 tablets on day 5.  Take 1 tablet on day 6., Disp: 21 tablet, Rfl: 0   Multiple Vitamins-Minerals (MULTI-VITAMIN GUMMIES) CHEW, Chew 1 Units by mouth daily., Disp: , Rfl:    naltrexone  (DEPADE) 50 MG tablet, Take 0.5 tablets (25 mg total) by mouth in the morning and at bedtime., Disp: 30 tablet, Rfl: 3   pravastatin  (PRAVACHOL ) 10 MG tablet, Take 1 tablet (10 mg total) by mouth daily., Disp: 90 tablet, Rfl: 3   pregabalin  (LYRICA ) 50 MG capsule, 50 mg during day, 100 mg qhs, Disp: 90 capsule, Rfl: 5   tretinoin (RETIN-A) 0.025 % cream, Apply topically at bedtime., Disp: , Rfl:    triamcinolone  ointment (KENALOG ) 0.5 %, Apply 1 application topically 2 (two) times daily., Disp: 30 g, Rfl: 5   Objective:     There were no vitals filed for this visit.    There is no height or weight on file to calculate BMI.    Physical Exam:    ***   Electronically signed by:  Odis Mace D[name] 7:35 AM 04/13/24

## 2024-04-14 ENCOUNTER — Ambulatory Visit: Admitting: Sports Medicine

## 2024-05-03 ENCOUNTER — Ambulatory Visit: Admitting: Family Medicine

## 2024-05-19 ENCOUNTER — Ambulatory Visit: Admitting: Family Medicine

## 2024-05-25 ENCOUNTER — Ambulatory Visit (INDEPENDENT_AMBULATORY_CARE_PROVIDER_SITE_OTHER)

## 2024-05-25 DIAGNOSIS — Z23 Encounter for immunization: Secondary | ICD-10-CM

## 2024-07-13 ENCOUNTER — Encounter: Payer: Self-pay | Admitting: Family Medicine

## 2024-07-13 ENCOUNTER — Encounter: Admitting: Family Medicine

## 2024-07-13 NOTE — Patient Instructions (Signed)

## 2024-07-13 NOTE — Progress Notes (Signed)
 No show CPE

## 2024-09-26 ENCOUNTER — Encounter: Admitting: Family Medicine
